# Patient Record
Sex: Male | Born: 1935
Health system: Southern US, Community
[De-identification: ages and names within clinical notes are randomized; demographics above are authoritative.]

## PROBLEM LIST (undated history)

## (undated) DIAGNOSIS — N183 Chronic kidney disease, stage 3 unspecified: Secondary | ICD-10-CM

## (undated) DIAGNOSIS — L409 Psoriasis, unspecified: Secondary | ICD-10-CM

## (undated) DIAGNOSIS — Z87442 Personal history of urinary calculi: Secondary | ICD-10-CM

## (undated) DIAGNOSIS — I2510 Atherosclerotic heart disease of native coronary artery without angina pectoris: Secondary | ICD-10-CM

## (undated) DIAGNOSIS — G5600 Carpal tunnel syndrome, unspecified upper limb: Secondary | ICD-10-CM

## (undated) DIAGNOSIS — Z8739 Personal history of other diseases of the musculoskeletal system and connective tissue: Secondary | ICD-10-CM

## (undated) DIAGNOSIS — R079 Chest pain, unspecified: Secondary | ICD-10-CM

## (undated) DIAGNOSIS — R200 Anesthesia of skin: Secondary | ICD-10-CM

## (undated) DIAGNOSIS — N400 Enlarged prostate without lower urinary tract symptoms: Secondary | ICD-10-CM

## (undated) DIAGNOSIS — I350 Nonrheumatic aortic (valve) stenosis: Secondary | ICD-10-CM

## (undated) DIAGNOSIS — I4892 Unspecified atrial flutter: Secondary | ICD-10-CM

## (undated) DIAGNOSIS — J189 Pneumonia, unspecified organism: Secondary | ICD-10-CM

## (undated) DIAGNOSIS — H269 Unspecified cataract: Secondary | ICD-10-CM

## (undated) HISTORY — PX: OTHER SURGICAL HISTORY: SHX169

## (undated) HISTORY — DX: Nonrheumatic aortic (valve) stenosis: I35.0

## (undated) HISTORY — PX: COLONOSCOPY: SHX174

## (undated) HISTORY — PX: TONSILLECTOMY: SUR1361

---

## 2000-02-10 ENCOUNTER — Emergency Department (HOSPITAL_COMMUNITY): Admission: EM | Admit: 2000-02-10 | Discharge: 2000-02-10 | Payer: Self-pay | Admitting: Emergency Medicine

## 2000-02-10 ENCOUNTER — Encounter: Payer: Self-pay | Admitting: Emergency Medicine

## 2000-11-15 ENCOUNTER — Ambulatory Visit (HOSPITAL_COMMUNITY): Admission: RE | Admit: 2000-11-15 | Discharge: 2000-11-15 | Payer: Self-pay | Admitting: Cardiology

## 2002-12-11 ENCOUNTER — Ambulatory Visit (HOSPITAL_COMMUNITY): Admission: RE | Admit: 2002-12-11 | Discharge: 2002-12-11 | Payer: Self-pay | Admitting: Gastroenterology

## 2007-06-24 ENCOUNTER — Ambulatory Visit (HOSPITAL_COMMUNITY): Admission: RE | Admit: 2007-06-24 | Discharge: 2007-06-24 | Payer: Self-pay | Admitting: Urology

## 2008-11-01 ENCOUNTER — Ambulatory Visit (HOSPITAL_COMMUNITY): Admission: RE | Admit: 2008-11-01 | Discharge: 2008-11-01 | Payer: Self-pay | Admitting: Ophthalmology

## 2008-11-05 ENCOUNTER — Ambulatory Visit (HOSPITAL_COMMUNITY): Admission: RE | Admit: 2008-11-05 | Discharge: 2008-11-05 | Payer: Self-pay | Admitting: Ophthalmology

## 2010-10-07 LAB — BASIC METABOLIC PANEL
BUN: 12 mg/dL (ref 6–23)
CO2: 31 mEq/L (ref 19–32)
Calcium: 9.6 mg/dL (ref 8.4–10.5)
Chloride: 102 mEq/L (ref 96–112)
Creatinine, Ser: 1.11 mg/dL (ref 0.4–1.5)
GFR calc Af Amer: >60 mL/min (ref >60)
GFR calc non Af Amer: >60 mL/min (ref >60)
Glucose, Bld: 94 mg/dL (ref 70–99)
Potassium: 4.6 mEq/L (ref 3.5–5.1)
Sodium: 139 mEq/L (ref 135–145)

## 2010-10-07 LAB — CBC
HCT: 39.3 % (ref 39.0–52.0)
Hemoglobin: 13.7 g/dL (ref 13.0–17.0)
MCHC: 34.9 g/dL (ref 30.0–36.0)
MCV: 86.4 fL (ref 78.0–100.0)
Platelets: 211 10*3/uL (ref 150–400)
RBC: 4.55 MIL/uL (ref 4.22–5.81)
RDW: 13.5 % (ref 11.5–15.5)
WBC: 4.8 10*3/uL (ref 4.0–10.5)

## 2010-10-07 LAB — URINALYSIS, ROUTINE W REFLEX MICROSCOPIC
Glucose, UA: NEGATIVE mg/dL
Hgb urine dipstick: NEGATIVE
Ketones, ur: NEGATIVE mg/dL
pH: 5.5 (ref 5.0–8.0)

## 2010-10-07 LAB — URINE MICROSCOPIC-ADD ON

## 2010-11-11 NOTE — Op Note (Signed)
NAME:  Steven [name], Steven [name]                ACCOUNT NO.:  [id number]   MEDICAL RECORD NO.:  [id number]          PATIENT TYPE:  AMB   LOCATION:  SDS                          FACILITY:  [facility]   PHYSICIAN:  Salley Scarlet., M.D.DATE OF BIRTH:  [birth year]   DATE OF PROCEDURE:  11/02/2008  DATE OF DISCHARGE:  11/01/2008                               OPERATIVE REPORT   PREOPERATIVE DIAGNOSIS:  Immature cataract, right eye.   POSTOPERATIVE DIAGNOSIS:  Immature cataract, right eye.   OPERATION:  Kelman phacoemulsification of cataract, right eye with  intraocular lens implantation.   ANESTHESIA:  Local using Xylocaine with 2% Marcaine and 0.75% Wydase.   SURGEON:  Nadyne Coombes, MD   JUSTIFICATION FOR PROCEDURE:  This is a 76 year old gentleman who  complained of inability to see from the right eye.  This caused  difficulty to see and to read and to drive.  He was evaluated and found  to have a visual acuity correctable at 20/100 on the right and 20/30 on  the left.  There was a dense posterior subcapsular cataract with 2+  nuclear sclerosis of the right eye with 1+ nuclear sclerosis of the left  eye.  Cataract extraction with intraocular lens implantation was  recommended.  He is admitted at this time for that purpose.   PROCEDURE IN DETAIL:  Under the influence of IV sedation, a Van Lint  akinesia and retrobulbar anesthesia was given.  The patient was prepped  and draped in the usual manner.  The lid speculum was inserted under the  upper and lower lid of the right eye and a 4-0 silk traction suture was  passed through the belly of the superior rectus muscle retraction.  A  fornix-based conjunctival flap was turned and hemostasis was achieved  using cautery.  An incision was made in the sclera at the limbus.  This  incision was dissected down into the cornea using crescent blade.  A  sideport incision was made at 1:30 o'clock position.  OcuCoat was  injected into the eye through the  sideport incision.  The anterior  chamber was then entered through the corneoscleral tunnel incision at  11:30 o'clock position.  An anterior capsulotomy was made using bent 25  gauge needle.  The nucleus was hydrodissected using Xylocaine.  The KPE  handpiece was passed into the eye and the nucleus was emulsified without  difficulty.  The residual cortical material was aspirated.  The  posterior capsule was polished using olive-tip polisher.  The wound was  widened slightly to accommodate a foldable silicone lens.  The lens was  seated into the eye behind the iris without difficulty.  The anterior  chamber was reformed and pupil was constricted using Miochol.  The lips  of the wound were hydrated and tested to make sure that there was no  leak.  After ascertaining that there was no leak, the conjunctiva was  closed over the wound using thermal cautery.  A 1 mL of Celestone and  0.5 mL of gentamicin were then injected subconjunctivally.  Maxitrol  ophthalmic ointment and prilocaine ointment were applied  along with a  patch and Fox  shield.  The patient tolerated the procedure well and was discharged to  the Postanesthesia Recovery in a satisfactory condition.  He was  instructed to rest today, to take Tylenol every 4 hours as needed for  pain, and to see me in office tomorrow for further evaluation.      Salley Scarlet., M.D.  Electronically Signed     TB/MEDQ  D:  11/02/2008  T:  11/02/2008  Job:  [id number]

## 2010-11-14 NOTE — Op Note (Signed)
[place]. [facility]  Patient:    Steven [name], Steven [name]                         MRN: [id number] Proc. Date: 11/15/00 Adm. Date:  [id number] Attending:  [place], Peter Manning CC:         Al Decant. Janey Greaser, M.D.   Operative Report  INDICATIONS FOR PROCEDURE:  The patient is a 76 year old, black male, who has a history of hypertension and hypercholesterolemia.  He has had some left arm and chest heaviness.  Stress test suggested evidence of ischemia, low exercise level.  ACCESS:  Via the right femoral artery using standard Seldinger technique.  EQUIPMENT:  A 6 French 4 cm right and left Judkins catheter, 6 French pigtail catheter, 6 French arterial sheath.  MEDICATIONS:  Local anesthesia with 1% Xylocaine.  CONTRAST:  Omnipaque 150 cc.  HEMODYNAMIC DATA:  Aortic pressure is 149/76.  Left ventricular pressure is 146 with an EDP of 19.  There was no left ventricular or aortic valve gradient.  ANGIOGRAPHIC DATA:  Left coronary artery:  The left coronary artery arises and distributes normally.  Left main:  The left main coronary artery is normal.  Left anterior descending:  The left anterior descending artery and its branches are normal.  Left circumflex:  The left circumflex coronary artery has some mild narrowing in the proximal vessel less than or equal to 10%.  No obstructive disease is noted.  Right coronary artery:  The right coronary artery arises and distributes normally and is a large dominant vessel.  It appears normal.  LEFT VENTRICULAR ANGIOGRAPHY:  The left ventricular angiography is performed in the RAO and LAO cranial views.  It demonstrates normal left ventricular size with hyperdynamic left ventricular contractility.  Ejection fraction is estimated at 75%.  There is no mitral regurgitation or prolapse.  Postprocedure closure of the right femoral site was obtained using a AngioSeal collagen plug device.  This yielded excellent  hemostasis.  FINAL INTERPRETATION: 1. No significant coronary artery disease. 2. Normal left ventricular function. DD:  11/15/00 TD:  11/15/00 Job: [id number]

## 2010-11-14 NOTE — Op Note (Signed)
   NAME:  CAMDEN, KNOTEK                   ACCOUNT NO.:  [id number]   MEDICAL RECORD NO.:  [id number]                   PATIENT TYPE:  AMB   LOCATION:  ENDO                                 FACILITY:  [facility]   PHYSICIAN:  Danise Edge, M.D.                DATE OF BIRTH:  11/20/1934   DATE OF PROCEDURE:  12/11/2002  DATE OF DISCHARGE:                                 OPERATIVE REPORT   PROCEDURE PERFORMED:  Screening colonoscopy.   REFERRING PHYSICIAN:  Al Decant. Janey Greaser, M.D.   ENDOSCOPIST:  Charolett Bumpers, M.D.   INDICATIONS FOR PROCEDURE:  Mr. Zamarion Longest is a 76 year old male born  1935-04-09.  Mr. Sigg is scheduled to undergo his first screening  colonoscopy with polypectomy to prevent colon cancer.   PREMEDICATION:  Versed 7 mg, Demerol 70 mg.   INSTRUMENT USED:  Pediatric Olympus video colonoscope.   DESCRIPTION OF PROCEDURE:  After obtaining informed consent, Mr. Menning was  placed in the left lateral decubitus position.  I administered intravenous  Demerol and intravenous Versed to achieve conscious sedation for the  procedure.  The patient's blood pressure, oxygen saturations and cardiac  rhythm were monitored throughout the procedure and documented in the medical  record.   Anal inspection was normal.  Digital rectal exam revealed a non-nodular  prostate.  The pediatric Olympus adjustable colonoscope was introduced into  the rectum and advanced to the cecum.  Colonic preparation for the exam  today was excellent.   Rectum:  Normal.   Sigmoid colon and descending colon:  Normal.   Splenic flexure:  Normal.   Transverse colon:  Normal.   Hepatic flexure:  Normal.   Ascending colon:  Normal.   Cecum and ileocecal valve:  Normal.    ASSESSMENT:  Normal screening proctocolonoscopy to the cecum.  No endoscopic  evidence for the presence of colorectal neoplasia.   PLAN:                                                  Danise Edge,  M.D.    MJ/MEDQ  D:  12/11/2002  T:  12/11/2002  Job:  [id number]

## 2010-11-14 NOTE — H&P (Signed)
[place]. [facility]  Patient:    Steven [name], Steven [name]                         MRN: [id number] Adm. Date:  11/15/00 Attending:  Peter M. [place], M.D. CC:         Steven Decant. Janey Greaser, M.D.   History and Physical  DATE OF BIRTH:  [birth year]  CHIEF COMPLAINT:  Chest pain.  HISTORY OF PRESENT ILLNESS:  Steven [name] is a 76 year old black male who generally has been in good health.  Over the past two to three months he has been experiencing pain in his left hand and arm, as well as heaviness in his chest.  He cannot really associate this with exertion but he has noticed some shortness of breath on exertion.  He has had no nausea, vomiting, or diaphoresis, no other radiation of his discomfort.  He was seen recently by Dr. Janey Greaser who found that he had new onset of hypertension.  An exercise stress test was performed.  The patient was able to exercise only for three minutes and 43 seconds.  He had a marked hypertensive blood pressure response. He had marked ECG changes with at least 2 mm of ST segment depression in the lateral leads.  Because of his symptoms and markedly abnormal stress test he is now admitted for cardiac catheterization.  PAST MEDICAL HISTORY:  Significant for hypertension.  He was hospitalized with knee problems last year and had some fluid withdrawn from his knee.  He does not know his cholesterol status.  He denies history of diabetes.  ALLERGIES:  No known allergies.  CURRENT MEDICATIONS: 1. Altace 2.5 mg daily. 2. Aspirin 325 mg daily.  SOCIAL HISTORY:  Patient works at [employer], has worked there for 40 years. He is married.  He has seven children.  Denies tobacco or alcohol use.  FAMILY HISTORY:  Father is in his 19s and in reasonably good health.  Mother died in her 22s with kidney failure.  He has one sister who has heart trouble; three other siblings are in good health.  REVIEW OF SYSTEMS:  He has no history of TIA or stroke, no  claudication symptoms, no increased edema or orthopnea.  No bowel or bladder complaints. All other review of systems are negative.  PHYSICAL EXAMINATION:  GENERAL:  Patient is a pleasant, overweight black male in no apparent distress.  VITAL SIGNS:  Weight is 252.5.  Blood pressure 132/72, pulse 72 and regular, respirations are normal at 20.  HEENT:  Pupils equal, round, and reactive to light and accommodation. Funduscopic exam is benign.  Conjunctivae are clear, oropharynx is clear.  NECK:  Supple without JVD, adenopathy, thyromegaly, or bruits.  LUNGS:  Clear.  CARDIAC:  Reveals a regular rate and rhythm without gallops, murmurs, rubs, or clicks.  ABDOMEN:  Soft and nontender.  There is no hepatosplenomegaly, masses, or bruits.  Femoral and pedal pulses are 2+ and symmetric.  He has no edema.  RECTAL:  Deferred, having recently been done by Dr. Janey Greaser.  NEUROLOGIC:  Intact.  LABORATORY DATA:  Resting ECG demonstrates normal sinus rhythm, a normal ECG.  Chemistry panel was unremarkable.  Cholesterol was 202 with an LDL of 145, HDL 42, triglycerides 98.  PSA was normal at 2.9.  IMPRESSION: 1. Chest pain consistent with angina pectoris with markedly positive early    stress test. 2. Hypertension. 3. Hypercholesterolemia. 4. Obesity.  PLAN:  Patient will  be admitted for cardiac catheterization with further therapy pending these results. DD:  11/10/00 TD:  11/10/00 Job: [id number]

## 2012-03-03 ENCOUNTER — Encounter (HOSPITAL_COMMUNITY): Payer: Self-pay

## 2012-03-03 ENCOUNTER — Emergency Department (HOSPITAL_COMMUNITY)
Admission: EM | Admit: 2012-03-03 | Discharge: 2012-03-03 | Disposition: A | Discharge status: Home / Self Care | Payer: Medicare Other | Source: Home / Self Care | Attending: Emergency Medicine | Admitting: Emergency Medicine

## 2012-03-03 DIAGNOSIS — M5412 Radiculopathy, cervical region: Secondary | ICD-10-CM

## 2012-03-03 DIAGNOSIS — M109 Gout, unspecified: Secondary | ICD-10-CM

## 2012-03-03 LAB — URIC ACID: Uric Acid, Serum: 7.4 mg/dL (ref 4.0–7.8)

## 2012-03-03 MED ORDER — TRAMADOL HCL 50 MG PO TABS: 100.0000 mg | ORAL_TABLET | Freq: Three times a day (TID) | ORAL | Status: AC | PRN

## 2012-03-03 MED ORDER — COLCHICINE 0.6 MG PO TABS: ORAL_TABLET | ORAL | Status: DC

## 2012-03-03 MED ORDER — PREDNISONE 10 MG PO TABS: ORAL_TABLET | ORAL | Status: DC

## 2012-03-03 NOTE — ED Provider Notes (Signed)
Chief Complaint  Patient presents with  . Foot Pain  . Hand Pain    History of Present Illness:   The patient is a 77 year old male who had a 3 to four-day history of pain, swelling, and redness over the MTP joint of the right great toe. He denies any trauma to the area. He does have gout and takes a pill for this. He's not sure what the name of it is. He's not tried the pills, because he didn't think was gout. He denies any fever, chills, or other joint pain with the exception of pain in the right hand for the past month. This is localized over the little finger and half of the ring finger. He denies any numbness or tingling. There's no pain in the shoulder or the neck. No muscle weakness.  Review of Systems:  Other than noted above, the patient denies any of the following symptoms: Systemic:  No fevers, chills, sweats, or aches.  No fatigue or tiredness. Musculoskeletal:  No joint pain, arthritis, bursitis, swelling, back pain, or neck pain. Neurological:  No muscular weakness, paresthesias, headache, or trouble with speech or coordination.  No dizziness.  PMFSH:  Past medical history, family history, social history, meds, and allergies were reviewed.  Physical Exam:   Vital signs:  BP 167/79  Pulse 80  Temp 99.1 F (37.3 C) (Oral)  Resp 18  SpO2 98% Gen:  Alert and oriented times 3.  In no distress. Musculoskeletal: He has swelling and pain to palpation over the MTP joint of the right great toe. There is no erythema or heat. He does have pain on movement of the toe. Eye exam of the hand there is no swelling, tenderness to palpation, or erythema. Otherwise, all joints had a full a ROM with no swelling, bruising or deformity.  No edema, pulses full. Extremities were warm and pink.  Capillary refill was brisk.  Skin:  Clear, warm and dry.  No rash. Neuro:  Alert and oriented times 3.  Muscle strength was normal.  Sensation was intact to light touch. DTRs are normal.  Other Labs Obtained at  Urgent Care Center:  A uric acid levels obtained.  Results are pending at this time and we will call about any positive results.  Assessment:  The primary encounter diagnosis was Gout. A diagnosis of Cervical radiculopathy was also pertinent to this visit.  Plan:   1.  The following meds were prescribed:   New Prescriptions   COLCHICINE 0.6 MG TABLET    Take 2 now and 1 in 1 hour.  May repeat dose once daily.  For gout attack.   PREDNISONE (DELTASONE) 10 MG TABLET    Take 4 tabs daily for 4 days, 3 tabs daily for 4 days, 2 tabs daily for 4 days, then 1 tab daily for 4 days.   TRAMADOL (ULTRAM) 50 MG TABLET    Take 2 tablets (100 mg total) by mouth every 8 (eight) hours as needed for pain.   2.  The patient was instructed in symptomatic care, including rest and activity, elevation, application of ice and compression.  Appropriate handouts were given. 3.  The patient was told to return if becoming worse in any way, if no better in 3 or 4 days, and given some red flag symptoms that would indicate earlier return.   4.  The patient was told to follow up with his primary care physician in one week.   Reuben Likes, MD 03/03/12 2206

## 2012-03-03 NOTE — ED Notes (Signed)
C/o pain in rt hand for 2-3 weeks and pain in rt great toe that shoots through plantar surface of foot. For 3-4 days.  Denies any fall or injury.

## 2012-03-04 ENCOUNTER — Telehealth (HOSPITAL_COMMUNITY): Payer: Self-pay | Admitting: *Deleted

## 2012-03-04 NOTE — ED Notes (Signed)
Uric Acid 7.4.  I called pt. and verified x 2. Pt. given result and range.Pt. states he is getting better on the medication. Pt. told to talk to his PCP about it. Vassie Moselle 03/04/2012

## 2012-03-29 ENCOUNTER — Other Ambulatory Visit: Payer: Self-pay | Admitting: Diagnostic Neuroimaging

## 2012-03-29 DIAGNOSIS — R200 Anesthesia of skin: Secondary | ICD-10-CM

## 2012-03-29 DIAGNOSIS — M79609 Pain in unspecified limb: Secondary | ICD-10-CM

## 2012-04-02 ENCOUNTER — Ambulatory Visit
Admission: RE | Admit: 2012-04-02 | Discharge: 2012-04-02 | Disposition: A | Discharge status: Home / Self Care | Payer: Medicare Other | Source: Ambulatory Visit | Attending: Diagnostic Neuroimaging | Admitting: Diagnostic Neuroimaging

## 2012-04-02 DIAGNOSIS — R200 Anesthesia of skin: Secondary | ICD-10-CM

## 2012-04-02 DIAGNOSIS — M79609 Pain in unspecified limb: Secondary | ICD-10-CM

## 2012-12-29 ENCOUNTER — Other Ambulatory Visit: Payer: Self-pay

## 2012-12-29 MED ORDER — GABAPENTIN 300 MG PO CAPS: 300.0000 mg | ORAL_CAPSULE | Freq: Three times a day (TID) | ORAL | Status: DC

## 2013-02-24 ENCOUNTER — Other Ambulatory Visit: Payer: Self-pay | Admitting: Ophthalmology

## 2013-02-24 NOTE — H&P (Signed)
Patient Record  Steven [name], Steven [name] Patient Number:  [id number] Date of Birth:  [birth year] Age:  78 years old    Gender:  Male Date of Evaluation:  February 24, 2013  Chief Complaint:   78 year old male is referred for Cataract evaluation. He reports blurred vision OU with difficulty seeing road signs, and for driving at night. He has to use a magnifier with his glasses to read.  History of Present Illness:   He is Pseudophakic OD.OD.  C/O blurred vision os.   No pain or discomfort; no pus or mucus; no burning or itching.  Presents for evaluation.( Reviewed by Doctor: GG) Past History:  Allergies:  NKDA Medications:   Other Medications:  Gabapentin, Medication for pinced nerve Birth History:  none Past Ocular History:   Cataract surgery right eye Past Medical History:  none Past Surgical History:  none Family History:  no amblyopia, no blindness, + cataracts (father), no crossed eyes, no diabetic retinopathy, + glaucoma (father), no macular degeneration, no retinal detachment, no cancer, + diabetes (mother, sister), no heart disease, + high blood pressure (sister, mother), no stroke Social History:   Smoking Status: former smoker  Alcohol:  none   Driving status:  driving Marital status:  married Review of Systems:   All other systems are negative.  Examination:  Visual Acuity:   Distance VA Goldsby:  OD: 20/70    OS: 20/60 IOP:  OD:  14     OS:  14    @ 10:05AM (Goldmann applanation) Manifest Refraction:    Sphere    Cyl Axis       VA         Add       VA Prism Base R:  +0.50  -2.50  120    20/20       +2.75                      L:  +1.75  -2.25   60    20/50       +2.75                        Confrontation visual field:  OU:  Normal  Motility:  OU:  Normal  Pupils:  OU:  Shape, size, direct and consensual reaction normal  Adnexa:  Preauricular LN, lacrimal drainage, lacrimal glands, orbit normal  Eyelids:  Eyelids:  normal Conjunctiva:  OU:  bulbar, palpebral normal  Cornea:   OU:  epithelium, stroma, endothelium, tear film normal  Anterior Chamber:  OU:  depth normal, no cell, no flare  Iris:  OU:  normal Dilation:  OS: AK-Pentolate, Tropicacyl @ 11:38AM  Lens:  OD:  PC IOL in good position; ; 1+ posterior capsular opacity; 1+ cortical, 1+ posterior subcapsular cataract OS:  1+ cortical, 2+ posterior subcapsular cataract; 2+ nuclear sclerosis cataract  Vitreous:  OU:  normal  Optic Disc:  OD:  cupping: 0.35 x 0.4  OS:  cupping: 0.35 x 0.4   Macula:  OU:  normal  Vessels:  OU:  normal  Periphery:  OU:  normal A Scan / IOLMaster:  AScan predicts +19.00 Acrysof MA50BM for emmetropia OD  Orientation to person, place and time:  Normal  Mood and affect:  Normal  Impression:  366.19  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 We discussed  the related symptoms , visual significance and when we intervene with surgery. We discussed the surgical techniques used, risks and benefits of surgery.  I have recommended proceeding with Phaco IOL OS.   He indicated understanding our discussion and felt that his questions had been answered to his satisfaction.  He indicates that he desires to proceed with the recommended treatment/care plan. Patient Instructions: Please do not eat anything after mignight the day before surgery. Return to clinic:  september 18th atv 4:45 PM for post-operative follow-up  Schedule:  Phacoemulsification, Posterior Chamber Intraocular Lens , OS x 03/15/2013   (electronically signed) Shade Flood, MD

## 2013-03-09 ENCOUNTER — Encounter (HOSPITAL_COMMUNITY): Payer: Self-pay | Admitting: Respiratory Therapy

## 2013-03-09 ENCOUNTER — Other Ambulatory Visit (HOSPITAL_COMMUNITY): Payer: Self-pay | Admitting: *Deleted

## 2013-03-09 NOTE — Pre-Procedure Instructions (Signed)
Kasheem Toner Lux  03/09/2013   Your procedure is scheduled on:  March 15, 2013 at 2:00 PM  Report to [facility].  Call this number if you have problems the morning of surgery: [phone]   Remember:   Do not eat food or drink liquids after midnight.   Take these medicines the morning of surgery with A SIP OF WATER: Neurontin (Gabapentin)   Stop all Vitamins, Herbal Medications, Aspirin and Non-Steroidals (Voltaren (Diclofenac), Ibuprofen, Aleve, etc) as of today, 03/10/13.   Do not wear jewelry, make-up or nail polish.  Do not wear lotions, powders, or perfumes. You may wear deodorant.  Do not shave 48 hours prior to surgery. Men may shave face and neck.  Do not bring valuables to the hospital.  [facility] is not responsible                   for any belongings or valuables.  Contacts, dentures or bridgework may not be worn into surgery.  Leave suitcase in the car. After surgery it may be brought to your room.  For patients admitted to the hospital, checkout time is 11:00 AM the day of  discharge.   Patients discharged the day of surgery will not be allowed to drive  home.  Name and phone number of your driver: Family/friend  Special Instructions: Shower using CHG 2 nights before surgery and the night before surgery.  If you shower the day of surgery use CHG.  Use special wash - you have one bottle of CHG for all showers.  You should use approximately 1/3 of the bottle for each shower.   Please read over the following fact sheets that you were given: Pain Booklet, Coughing and Deep Breathing and Surgical Site Infection Prevention

## 2013-03-10 ENCOUNTER — Encounter (HOSPITAL_COMMUNITY)
Admission: RE | Admit: 2013-03-10 | Discharge: 2013-03-10 | Disposition: A | Discharge status: Home / Self Care | Payer: Medicare Other | Source: Ambulatory Visit | Attending: Ophthalmology | Admitting: Ophthalmology

## 2013-03-10 ENCOUNTER — Encounter (HOSPITAL_COMMUNITY): Payer: Self-pay

## 2013-03-10 DIAGNOSIS — Z01812 Encounter for preprocedural laboratory examination: Secondary | ICD-10-CM | POA: Insufficient documentation

## 2013-03-10 DIAGNOSIS — Z01818 Encounter for other preprocedural examination: Secondary | ICD-10-CM | POA: Insufficient documentation

## 2013-03-10 HISTORY — DX: Personal history of urinary calculi: Z87.442

## 2013-03-10 HISTORY — DX: Unspecified cataract: H26.9

## 2013-03-10 HISTORY — DX: Psoriasis, unspecified: L40.9

## 2013-03-10 HISTORY — DX: Benign prostatic hyperplasia without lower urinary tract symptoms: N40.0

## 2013-03-10 HISTORY — DX: Carpal tunnel syndrome, unspecified upper limb: G56.00

## 2013-03-10 HISTORY — DX: Anesthesia of skin: R20.0

## 2013-03-10 HISTORY — DX: Personal history of other diseases of the musculoskeletal system and connective tissue: Z87.39

## 2013-03-10 LAB — CBC
HCT: 36.6 % — ABNORMAL LOW (ref 39.0–52.0)
Platelets: 172 10*3/uL (ref 150–400)
RDW: 13.9 % (ref 11.5–15.5)
WBC: 3.6 10*3/uL — ABNORMAL LOW (ref 4.0–10.5)

## 2013-03-10 NOTE — Progress Notes (Addendum)
Pt doesn't have a cardiologist  Stress test/echo done 41yrs ago  Heart cath done 69yrs ago    Medical Md is Dr.Kevin Little  Denies ekg or cxr in past yr

## 2013-03-10 NOTE — Pre-Procedure Instructions (Signed)
Rupert Azzara Tourigny  03/10/2013   Your procedure is scheduled on:  Wed, Sept 17 @ 2:00 PM  Report to [facility] at 12:00 PM.  Call this number if you have problems the morning of surgery: [phone]   Remember:   Do not eat food or drink liquids after midnight.   Take these medicines the morning of surgery with A SIP OF WATER: Gabapentin(Neurontin-if needed)   Do not wear jewelry  Do not wear lotions, powders, or colognes. You may wear deodorant.  Do not shave 48 hours prior to surgery. Men may shave face and neck.  Do not bring valuables to the hospital.  [facility] is not responsible                   for any belongings or valuables.  Contacts, dentures or bridgework may not be worn into surgery.  Leave suitcase in the car. After surgery it may be brought to your room.  For patients admitted to the hospital, checkout time is 11:00 AM the day of  discharge.   Patients discharged the day of surgery will not be allowed to drive  home.    Special Instructions: Shower using CHG 2 nights before surgery and the night before surgery.  If you shower the day of surgery use CHG.  Use special wash - you have one bottle of CHG for all showers.  You should use approximately 1/3 of the bottle for each shower.   Please read over the following fact sheets that you were given: Pain Booklet, Coughing and Deep Breathing and Surgical Site Infection Prevention

## 2013-03-14 NOTE — Progress Notes (Signed)
I spoke with patient and informed him of new arrival time of 0630.

## 2013-03-15 ENCOUNTER — Encounter (HOSPITAL_COMMUNITY)
Admission: RE | Disposition: A | Discharge status: Home / Self Care | Payer: Self-pay | Source: Ambulatory Visit | Attending: Ophthalmology

## 2013-03-15 ENCOUNTER — Encounter (HOSPITAL_COMMUNITY): Payer: Self-pay | Admitting: Anesthesiology

## 2013-03-15 ENCOUNTER — Ambulatory Visit (HOSPITAL_COMMUNITY)
Admission: RE | Admit: 2013-03-15 | Discharge: 2013-03-15 | Disposition: A | Discharge status: Home / Self Care | Payer: Medicare Other | Source: Ambulatory Visit | Attending: Ophthalmology | Admitting: Ophthalmology

## 2013-03-15 ENCOUNTER — Encounter (HOSPITAL_COMMUNITY): Payer: Self-pay | Admitting: *Deleted

## 2013-03-15 ENCOUNTER — Ambulatory Visit (HOSPITAL_COMMUNITY): Payer: Medicare Other | Admitting: Anesthesiology

## 2013-03-15 DIAGNOSIS — H47239 Glaucomatous optic atrophy, unspecified eye: Secondary | ICD-10-CM | POA: Insufficient documentation

## 2013-03-15 DIAGNOSIS — H2589 Other age-related cataract: Secondary | ICD-10-CM | POA: Insufficient documentation

## 2013-03-15 HISTORY — PX: CATARACT EXTRACTION W/PHACO: SHX586

## 2013-03-15 SURGERY — PHACOEMULSIFICATION, CATARACT, WITH IOL INSERTION
Anesthesia: Monitor Anesthesia Care | Site: Eye | Laterality: Left | Wound class: Clean

## 2013-03-15 MED ORDER — SODIUM CHLORIDE 0.9 % IV SOLN
INTRAVENOUS | Status: DC | PRN
  Administered 2013-03-15: 08:00:00 via INTRAVENOUS

## 2013-03-15 MED ORDER — ACETAZOLAMIDE SODIUM 500 MG IJ SOLR
INTRAMUSCULAR | Status: AC
  Filled 2013-03-15: qty 500

## 2013-03-15 MED ORDER — EPINEPHRINE HCL 1 MG/ML IJ SOLN
INTRAOCULAR | Status: DC | PRN
  Administered 2013-03-15: 09:00:00

## 2013-03-15 MED ORDER — TRIAMCINOLONE ACETONIDE 40 MG/ML IJ SUSP
INTRAMUSCULAR | Status: AC
  Filled 2013-03-15: qty 5

## 2013-03-15 MED ORDER — BSS IO SOLN
INTRAOCULAR | Status: AC
  Filled 2013-03-15: qty 500

## 2013-03-15 MED ORDER — LIDOCAINE HCL 2 % IJ SOLN
INTRAMUSCULAR | Status: AC
  Filled 2013-03-15: qty 20

## 2013-03-15 MED ORDER — SODIUM HYALURONATE 10 MG/ML IO SOLN
INTRAOCULAR | Status: AC
  Filled 2013-03-15: qty 0.85

## 2013-03-15 MED ORDER — NA CHONDROIT SULF-NA HYALURON 40-30 MG/ML IO SOLN
INTRAOCULAR | Status: AC
  Filled 2013-03-15: qty 0.5

## 2013-03-15 MED ORDER — PROVISC 10 MG/ML IO SOLN
INTRAOCULAR | Status: DC | PRN
  Administered 2013-03-15: 8.5 mg via INTRAOCULAR

## 2013-03-15 MED ORDER — PHENYLEPHRINE HCL 2.5 % OP SOLN
1.0000 [drp] | OPHTHALMIC | Status: AC | PRN
  Administered 2013-03-15 (×3): 1 [drp] via OPHTHALMIC
  Filled 2013-03-15: qty 2

## 2013-03-15 MED ORDER — DEXAMETHASONE SODIUM PHOSPHATE 10 MG/ML IJ SOLN
INTRAMUSCULAR | Status: AC
  Filled 2013-03-15: qty 1

## 2013-03-15 MED ORDER — DEXAMETHASONE SODIUM PHOSPHATE 10 MG/ML IJ SOLN
INTRAMUSCULAR | Status: DC | PRN
  Administered 2013-03-15: 10 mg

## 2013-03-15 MED ORDER — EPINEPHRINE HCL 1 MG/ML IJ SOLN
INTRAMUSCULAR | Status: AC
  Filled 2013-03-15: qty 1

## 2013-03-15 MED ORDER — BUPIVACAINE HCL (PF) 0.75 % IJ SOLN
INTRAMUSCULAR | Status: AC
  Filled 2013-03-15: qty 10

## 2013-03-15 MED ORDER — CEFAZOLIN SUBCONJUNCTIVAL INJECTION 100 MG/0.5 ML
100.0000 mg | INJECTION | SUBCONJUNCTIVAL | Status: DC
  Filled 2013-03-15: qty 0.5

## 2013-03-15 MED ORDER — NA CHONDROIT SULF-NA HYALURON 40-30 MG/ML IO SOLN
INTRAOCULAR | Status: DC | PRN
  Administered 2013-03-15: 0.5 mL via INTRAOCULAR

## 2013-03-15 MED ORDER — BALANCED SALT IO SOLN
INTRAOCULAR | Status: DC | PRN
  Administered 2013-03-15: 15 mL via INTRAOCULAR

## 2013-03-15 MED ORDER — TETRACAINE HCL 0.5 % OP SOLN
2.0000 [drp] | OPHTHALMIC | Status: AC
  Administered 2013-03-15: 2 [drp] via OPHTHALMIC
  Filled 2013-03-15: qty 2

## 2013-03-15 MED ORDER — PROPOFOL 10 MG/ML IV BOLUS
INTRAVENOUS | Status: DC | PRN
  Administered 2013-03-15: 60 mg via INTRAVENOUS

## 2013-03-15 MED ORDER — LIDOCAINE HCL 2 % IJ SOLN
INTRAMUSCULAR | Status: DC | PRN
  Administered 2013-03-15 (×2): via RETROBULBAR

## 2013-03-15 MED ORDER — HYPROMELLOSE (GONIOSCOPIC) 2.5 % OP SOLN
OPHTHALMIC | Status: DC | PRN
  Administered 2013-03-15: 2 [drp] via OPHTHALMIC

## 2013-03-15 MED ORDER — GATIFLOXACIN 0.5 % OP SOLN
1.0000 [drp] | OPHTHALMIC | Status: AC | PRN
  Administered 2013-03-15 (×3): 1 [drp] via OPHTHALMIC
  Filled 2013-03-15: qty 2.5

## 2013-03-15 MED ORDER — HYPROMELLOSE (GONIOSCOPIC) 2.5 % OP SOLN
OPHTHALMIC | Status: AC
  Filled 2013-03-15: qty 15

## 2013-03-15 MED ORDER — PREDNISOLONE ACETATE 1 % OP SUSP
1.0000 [drp] | OPHTHALMIC | Status: AC
  Administered 2013-03-15: 1 [drp] via OPHTHALMIC
  Filled 2013-03-15: qty 5

## 2013-03-15 MED ORDER — BACITRACIN-POLYMYXIN B 500-10000 UNIT/GM OP OINT
TOPICAL_OINTMENT | OPHTHALMIC | Status: AC
  Filled 2013-03-15: qty 3.5

## 2013-03-15 MED ORDER — BACITRACIN-POLYMYXIN B 500-10000 UNIT/GM OP OINT
TOPICAL_OINTMENT | OPHTHALMIC | Status: DC | PRN
  Administered 2013-03-15: 1 via OPHTHALMIC

## 2013-03-15 MED ORDER — CEFAZOLIN SUBCONJUNCTIVAL INJECTION 100 MG/0.5 ML
INJECTION | SUBCONJUNCTIVAL | Status: DC | PRN
  Administered 2013-03-15: 100 mg via SUBCONJUNCTIVAL

## 2013-03-15 SURGICAL SUPPLY — 59 items
APPLICATOR COTTON TIP 6IN STRL (MISCELLANEOUS) ×2 IMPLANT
APPLICATOR DR MATTHEWS STRL (MISCELLANEOUS) ×2 IMPLANT
BAG MINI COLL DRAIN (WOUND CARE) ×2 IMPLANT
BLADE EYE MINI 60D BEAVER (BLADE) IMPLANT
BLADE KERATOME 2.75 (BLADE) ×2 IMPLANT
BLADE STAB KNIFE 15DEG (BLADE) IMPLANT
CANNULA ANTERIOR CHAMBER 27GA (MISCELLANEOUS) IMPLANT
CLOTH BEACON ORANGE TIMEOUT ST (SAFETY) ×2 IMPLANT
COVER MAYO STAND STRL (DRAPES) ×2 IMPLANT
DRAPE OPHTHALMIC 77X100 STRL (CUSTOM PROCEDURE TRAY) ×2 IMPLANT
DRAPE POUCH INSTRU U-SHP 10X18 (DRAPES) ×2 IMPLANT
DRSG TEGADERM 4X4.75 (GAUZE/BANDAGES/DRESSINGS) ×2 IMPLANT
FILTER BLUE MILLIPORE (MISCELLANEOUS) IMPLANT
GLOVE BIO SURGEON STRL SZ7.5 (GLOVE) ×2 IMPLANT
GLOVE SS BIOGEL STRL SZ 6.5 (GLOVE) ×1 IMPLANT
GLOVE SUPERSENSE BIOGEL SZ 6.5 (GLOVE) ×1
GLOVE SURG SS PI 7.0 STRL IVOR (GLOVE) ×2 IMPLANT
GOWN SRG XL XLNG 56XLVL 4 (GOWN DISPOSABLE) ×1 IMPLANT
GOWN STRL NON-REIN LRG LVL3 (GOWN DISPOSABLE) ×2 IMPLANT
GOWN STRL NON-REIN XL XLG LVL4 (GOWN DISPOSABLE) ×1
KIT BASIN OR (CUSTOM PROCEDURE TRAY) ×2 IMPLANT
KIT ROOM TURNOVER OR (KITS) ×2 IMPLANT
KNIFE GRIESHABER SHARP 2.5MM (MISCELLANEOUS) ×2 IMPLANT
LENS IOL ACRYSOF MP POST 19.0 (Intraocular Lens) ×2 IMPLANT
MASK EYE SHIELD (GAUZE/BANDAGES/DRESSINGS) ×2 IMPLANT
NEEDLE 18GX1X1/2 (RX/OR ONLY) (NEEDLE) ×2 IMPLANT
NEEDLE 22X1 1/2 (OR ONLY) (NEEDLE) IMPLANT
NEEDLE 25GX 5/8IN NON SAFETY (NEEDLE) ×2 IMPLANT
NEEDLE FILTER BLUNT 18X 1/2SAF (NEEDLE)
NEEDLE FILTER BLUNT 18X1 1/2 (NEEDLE) IMPLANT
NEEDLE HYPO 30X.5 LL (NEEDLE) ×4 IMPLANT
NS IRRIG 1000ML POUR BTL (IV SOLUTION) ×2 IMPLANT
PACK CATARACT CUSTOM (CUSTOM PROCEDURE TRAY) ×2 IMPLANT
PAD ARMBOARD 7.5X6 YLW CONV (MISCELLANEOUS) ×4 IMPLANT
PAD EYE OVAL STERILE LF (GAUZE/BANDAGES/DRESSINGS) ×2 IMPLANT
PAK PIK CVS CATARACT (OPHTHALMIC) ×2 IMPLANT
PROBE ANTERIOR 20G W/INFUS NDL (MISCELLANEOUS) IMPLANT
ROLLS DENTAL (MISCELLANEOUS) IMPLANT
SHUTTLE MONARCH TYPE A (NEEDLE) ×2 IMPLANT
SPEAR EYE SURG WECK-CEL (MISCELLANEOUS) IMPLANT
SUT ETHILON 10-0 CS-B-6CS-B-6 (SUTURE)
SUT ETHILON 5 0 P 3 18 (SUTURE)
SUT ETHILON 9 0 TG140 8 (SUTURE) IMPLANT
SUT NYLON ETHILON 5-0 P-3 1X18 (SUTURE) IMPLANT
SUT PLAIN 6 0 TG1408 (SUTURE) IMPLANT
SUT POLY NON ABSORB 10-0 8 STR (SUTURE) IMPLANT
SUT VICRYL 6 0 S 29 12 (SUTURE) IMPLANT
SUTURE EHLN 10-0 CS-B-6CS-B-6 (SUTURE) IMPLANT
SYR 20CC LL (SYRINGE) IMPLANT
SYR 5ML LL (SYRINGE) IMPLANT
SYR TB 1ML LUER SLIP (SYRINGE) IMPLANT
SYRINGE 10CC LL (SYRINGE) IMPLANT
TAPE SURG TRANSPORE 1 IN (GAUZE/BANDAGES/DRESSINGS) ×1 IMPLANT
TAPE SURGICAL TRANSPORE 1 IN (GAUZE/BANDAGES/DRESSINGS) ×1
TIP ABS 45DEG FLARED 0.9MM (TIP) ×2 IMPLANT
TOWEL OR 17X24 6PK STRL BLUE (TOWEL DISPOSABLE) ×4 IMPLANT
WATER STERILE IRR 1000ML POUR (IV SOLUTION) ×2 IMPLANT
WIPE INSTRUMENT ADHESIVE BACK (MISCELLANEOUS) ×2 IMPLANT
WIPE INSTRUMENT VISIWIPE 73X73 (MISCELLANEOUS) ×2 IMPLANT

## 2013-03-15 NOTE — Anesthesia Preprocedure Evaluation (Signed)
Anesthesia Evaluation  Patient identified by MRN, date of birth, ID band Patient awake    Reviewed: Allergy & Precautions, H&P , NPO status   Airway Mallampati: I      Dental   Pulmonary neg pulmonary ROS,  breath sounds clear to auscultation        Cardiovascular negative cardio ROS  Rhythm:Regular Rate:Normal     Neuro/Psych  Neuromuscular disease    GI/Hepatic negative GI ROS,   Endo/Other  negative endocrine ROS  Renal/GU negative Renal ROS     Musculoskeletal negative musculoskeletal ROS (+)   Abdominal   Peds  Hematology negative hematology ROS (+)   Anesthesia Other Findings   Reproductive/Obstetrics                           Anesthesia Physical Anesthesia Plan  ASA: I  Anesthesia Plan: MAC   Post-op Pain Management:    Induction: Intravenous  Airway Management Planned: Simple Face Mask  Additional Equipment:   Intra-op Plan:   Post-operative Plan:   Informed Consent:   Dental advisory given  Plan Discussed with: CRNA and Surgeon  Anesthesia Plan Comments:         Anesthesia Quick Evaluation

## 2013-03-15 NOTE — Preoperative (Signed)
Beta Blockers   Reason not to administer Beta Blockers:Not Applicable 

## 2013-03-15 NOTE — Anesthesia Postprocedure Evaluation (Signed)
  Anesthesia Post-op Note  Patient: Steven [name]  Procedure(s) Performed: Procedure(s): CATARACT EXTRACTION PHACO AND INTRAOCULAR LENS PLACEMENT (IOC) (Left)  Patient Location: PACU and Short Stay  Anesthesia Type:MAC  Level of Consciousness: awake, alert , oriented and patient cooperative  Airway and Oxygen Therapy: Patient Spontanous Breathing  Post-op Pain: none  Post-op Assessment: Post-op Vital signs reviewed, Patient's Cardiovascular Status Stable, Respiratory Function Stable, Patent Airway, No signs of Nausea or vomiting and Pain level controlled  Post-op Vital Signs: Reviewed and stable  Complications: No apparent anesthesia complications

## 2013-03-15 NOTE — H&P (View-Only) (Signed)
Patient Record  [name], [name] Patient Number:  [id number] Date of Birth:  October 22, 1934 Age:  78 years old    Gender:  Male Date of Evaluation:  February 24, 2013  Chief Complaint:   78 year old male is referred for Cataract evaluation. He reports blurred vision OU with difficulty seeing road signs, and for driving at night. He has to use a magnifier with his glasses to read.  History of Present Illness:   He is Pseudophakic OD.OD.  C/O blurred vision os.   No pain or discomfort; no pus or mucus; no burning or itching.  Presents for evaluation.( Reviewed by Doctor: GG) Past History:  Allergies:  NKDA Medications:   Other Medications:  Gabapentin, Medication for pinced nerve Birth History:  none Past Ocular History:   Cataract surgery right eye Past Medical History:  none Past Surgical History:  none Family History:  no amblyopia, no blindness, + cataracts (father), no crossed eyes, no diabetic retinopathy, + glaucoma (father), no macular degeneration, no retinal detachment, no cancer, + diabetes (mother, sister), no heart disease, + high blood pressure (sister, mother), no stroke Social History:   Smoking Status: former smoker  Alcohol:  none   Driving status:  driving Marital status:  married Review of Systems:   All other systems are negative.  Examination:  Visual Acuity:   Distance VA [place]:  OD: 20/70    OS: 20/60 IOP:  OD:  14     OS:  14    @ 10:05AM (Goldmann applanation) Manifest Refraction:    Sphere    Cyl Axis       VA         Add       VA Prism Base R:  +0.50  -2.50  120    20/20       +2.75                      L:  +1.75  -2.25   60    20/50       +2.75                        Confrontation visual field:  OU:  Normal  Motility:  OU:  Normal  Pupils:  OU:  Shape, size, direct and consensual reaction normal  Adnexa:  Preauricular LN, lacrimal drainage, lacrimal glands, orbit normal  Eyelids:  Eyelids:  normal Conjunctiva:  OU:  bulbar, palpebral normal  Cornea:   OU:  epithelium, stroma, endothelium, tear film normal  Anterior Chamber:  OU:  depth normal, no cell, no flare  Iris:  OU:  normal Dilation:  OS: AK-Pentolate, Tropicacyl @ 11:38AM  Lens:  OD:  PC IOL in good position; ; 1+ posterior capsular opacity; 1+ cortical, 1+ posterior subcapsular cataract OS:  1+ cortical, 2+ posterior subcapsular cataract; 2+ nuclear sclerosis cataract  Vitreous:  OU:  normal  Optic Disc:  OD:  cupping: 0.35 x 0.4  OS:  cupping: 0.35 x 0.4   Macula:  OU:  normal  Vessels:  OU:  normal  Periphery:  OU:  normal A Scan / IOLMaster:  AScan predicts +19.00 Acrysof MA50BM for emmetropia OD  Orientation to person, place and time:  Normal  Mood and affect:  Normal  Impression:  366.19  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 We discussed   the related symptoms , visual significance and when we intervene with surgery. We discussed the surgical techniques used, risks and benefits of surgery.  I have recommended proceeding with Phaco IOL OS.   He indicated understanding our discussion and felt that his questions had been answered to his satisfaction.  He indicates that he desires to proceed with the recommended treatment/care plan. Patient Instructions: Please do not eat anything after mignight the day before surgery. Return to clinic:  september 18th atv 4:45 PM for post-operative follow-up  Schedule:  Phacoemulsification, Posterior Chamber Intraocular Lens , OS x 03/15/2013   (electronically signed) Eira Alpert, MD   

## 2013-03-15 NOTE — Transfer of Care (Signed)
Immediate Anesthesia Transfer of Care Note  Patient: Steven [name]  Procedure(s) Performed: Procedure(s): CATARACT EXTRACTION PHACO AND INTRAOCULAR LENS PLACEMENT (IOC) (Left)  Patient Location: PACU  Anesthesia Type:MAC  Level of Consciousness: awake, alert  and oriented  Airway & Oxygen Therapy: Patient Spontanous Breathing  Post-op Assessment: Report given to PACU RN  Post vital signs: Reviewed and stable  Complications: No apparent anesthesia complications

## 2013-03-15 NOTE — Interval H&P Note (Signed)
History and Physical Interval Note:  03/15/2013 8:30 AM  Steven [name]  has presented today for surgery, with the diagnosis of Combined Cataract Left Eye  The various methods of treatment have been discussed with the patient and family. After consideration of risks, benefits and other options for treatment, the patient has consented to  Procedure(s): CATARACT EXTRACTION PHACO AND INTRAOCULAR LENS PLACEMENT (IOC) (Left) as a surgical intervention .  The patient's history has been reviewed, patient examined, no change in status, stable for surgery.  I have reviewed the patient's chart and labs.  Questions were answered to the patient's satisfaction.     Jaria Conway, Waynette Buttery

## 2013-03-15 NOTE — Op Note (Signed)
Steven [name] 03/15/2013 Cataract: Combined, Nuclear  Procedure: Phacoemulsification, Posterior Chamber Intra-ocular Lens Operative Eye:  left eye  Surgeon: Shade Flood Estimated Blood Loss: minimal Specimens for Pathology:  None Complications: none  The patient was prepared and draped in the usual manner for ocular surgery on the left eye. A Cook lid speculum was placed. A peripheral clear corneal incision was made at the surgical limbus centered at the 11:00 meridian. A separate clear corneal stab incision was made with a 15 degree blade at the 2:00 meridian to permit bi-manual technique. Viscoat and  Provisc as an underlying layer next to the capsule was instilled into the anterior chamber through that incision.  A keratome was used to create a self sealing incision entering the anterior chamber at the 11:00 meridian. A capsulorhexis was performed using a bent 25g needle. The lens was hydrodissected and the nucleus was hydrodilineated using a Nichammin cannula. The Chang chopper was inserted and used to rotate the lens to insure adequate lens mobility. The phacoemulsification handpiece was inserted and a combined phaco-chop technique was employed, fracturing the lens into separate sections with subsequent removal with the phaco handpiece.   The I/A cannula was used to remove remaining lens cortex. Provisc was instilled and used to deepen the anterior chamber and posterior capsule bag. The Monarch injector was used to place a folded Acrysof MA50BM PC IOL, + 19.00  diopters, into the capsule bag. A Sinskey lens hook was used to dial in the trailing haptic.  The I/A cannula was used to remove the viscoelastic from the anterior chamber. BSS was used to bring IOP to the desired range and the wound was checked to insure it was watertight. Subconjunctival injections of Ancef 100/0.89ml and Dexamethasone 0.5 ml of a 10mg /23ml solution were placed without complication. The lid speculum and drapes were  removed and the patient's eye was patched with Polymixin/Bacitracin ophthalmic ointment. An eye shield was placed and the patient was transferred alert and conversant from the operating room to the post-operative recovery area.   Shade Flood, MD

## 2013-03-16 ENCOUNTER — Encounter (HOSPITAL_COMMUNITY): Payer: Self-pay | Admitting: Ophthalmology

## 2013-06-23 ENCOUNTER — Other Ambulatory Visit: Payer: Self-pay | Admitting: Diagnostic Neuroimaging

## 2013-11-24 ENCOUNTER — Emergency Department (HOSPITAL_COMMUNITY): Payer: Medicare HMO

## 2013-11-24 ENCOUNTER — Inpatient Hospital Stay (HOSPITAL_COMMUNITY)
Admission: EM | Admit: 2013-11-24 | Discharge: 2013-11-27 | DRG: 195 | Disposition: A | Discharge status: Home / Self Care | Payer: Medicare HMO | Attending: Internal Medicine | Admitting: Internal Medicine

## 2013-11-24 ENCOUNTER — Encounter (HOSPITAL_COMMUNITY): Payer: Self-pay | Admitting: Emergency Medicine

## 2013-11-24 DIAGNOSIS — M7021 Olecranon bursitis, right elbow: Secondary | ICD-10-CM

## 2013-11-24 DIAGNOSIS — M7020 Olecranon bursitis, unspecified elbow: Secondary | ICD-10-CM | POA: Diagnosis present

## 2013-11-24 DIAGNOSIS — Z87891 Personal history of nicotine dependence: Secondary | ICD-10-CM

## 2013-11-24 DIAGNOSIS — J189 Pneumonia, unspecified organism: Principal | ICD-10-CM | POA: Diagnosis present

## 2013-11-24 DIAGNOSIS — R011 Cardiac murmur, unspecified: Secondary | ICD-10-CM

## 2013-11-24 DIAGNOSIS — M109 Gout, unspecified: Secondary | ICD-10-CM | POA: Diagnosis present

## 2013-11-24 DIAGNOSIS — I059 Rheumatic mitral valve disease, unspecified: Secondary | ICD-10-CM | POA: Diagnosis present

## 2013-11-24 DIAGNOSIS — Z9849 Cataract extraction status, unspecified eye: Secondary | ICD-10-CM

## 2013-11-24 DIAGNOSIS — R509 Fever, unspecified: Secondary | ICD-10-CM

## 2013-11-24 DIAGNOSIS — Z87442 Personal history of urinary calculi: Secondary | ICD-10-CM

## 2013-11-24 LAB — CBC
HCT: 33.6 % — ABNORMAL LOW (ref 39.0–52.0)
Hemoglobin: 11.4 g/dL — ABNORMAL LOW (ref 13.0–17.0)
MCH: 29 pg (ref 26.0–34.0)
MCHC: 33.9 g/dL (ref 30.0–36.0)
MCV: 85.5 fL (ref 78.0–100.0)
PLATELETS: 178 10*3/uL (ref 150–400)
RBC: 3.93 MIL/uL — AB (ref 4.22–5.81)
RDW: 13.5 % (ref 11.5–15.5)
WBC: 8.6 10*3/uL (ref 4.0–10.5)

## 2013-11-24 LAB — COMPREHENSIVE METABOLIC PANEL
ALT: 13 U/L (ref 0–53)
AST: 20 U/L (ref 0–37)
Albumin: 3.6 g/dL (ref 3.5–5.2)
Alkaline Phosphatase: 54 U/L (ref 39–117)
BILIRUBIN TOTAL: 0.3 mg/dL (ref 0.3–1.2)
BUN: 25 mg/dL — ABNORMAL HIGH (ref 6–23)
CHLORIDE: 97 meq/L (ref 96–112)
CO2: 24 meq/L (ref 19–32)
Calcium: 9.1 mg/dL (ref 8.4–10.5)
Creatinine, Ser: 1.12 mg/dL (ref 0.50–1.35)
GFR calc Af Amer: 71 mL/min — ABNORMAL LOW (ref >90)
GFR, EST NON AFRICAN AMERICAN: 61 mL/min — AB (ref >90)
Glucose, Bld: 112 mg/dL — ABNORMAL HIGH (ref 70–99)
POTASSIUM: 4.2 meq/L (ref 3.7–5.3)
SODIUM: 135 meq/L — AB (ref 137–147)
Total Protein: 7.6 g/dL (ref 6.0–8.3)

## 2013-11-24 LAB — URINALYSIS, ROUTINE W REFLEX MICROSCOPIC
Bilirubin Urine: NEGATIVE
GLUCOSE, UA: NEGATIVE mg/dL
HGB URINE DIPSTICK: NEGATIVE
KETONES UR: NEGATIVE mg/dL
Leukocytes, UA: NEGATIVE
Nitrite: NEGATIVE
PH: 7 (ref 5.0–8.0)
PROTEIN: NEGATIVE mg/dL
Specific Gravity, Urine: 1.015 (ref 1.005–1.030)
Urobilinogen, UA: 1 mg/dL (ref 0.0–1.0)

## 2013-11-24 LAB — I-STAT CG4 LACTIC ACID, ED: Lactic Acid, Venous: 0.8 mmol/L (ref 0.5–2.2)

## 2013-11-24 MED ORDER — VANCOMYCIN HCL IN DEXTROSE 1-5 GM/200ML-% IV SOLN: 1000.0000 mg | Freq: Once | INTRAVENOUS | Status: DC

## 2013-11-24 MED ORDER — SODIUM CHLORIDE 0.9 % IV SOLN
Freq: Once | INTRAVENOUS | Status: AC
  Administered 2013-11-24: via INTRAVENOUS

## 2013-11-24 MED ORDER — ACETAMINOPHEN 500 MG PO TABS
1000.0000 mg | ORAL_TABLET | Freq: Once | ORAL | Status: AC
  Administered 2013-11-24: 1000 mg via ORAL
  Filled 2013-11-24: qty 2

## 2013-11-24 MED ORDER — DEXTROSE 5 % IV SOLN: 500.0000 mg | Freq: Once | INTRAVENOUS | Status: DC

## 2013-11-24 MED ORDER — DEXTROSE 5 % IV SOLN
1.0000 g | Freq: Once | INTRAVENOUS | Status: AC
  Administered 2013-11-24: 1 g via INTRAVENOUS
  Filled 2013-11-24: qty 10

## 2013-11-24 NOTE — H&P (Signed)
[facility] History and Physical  Steven [name] POE:[id number] DOB: 08-May-1935 DOA: 11/24/2013  Referring physician: Murlean Caller, MD PCP: Gennette Pac, MD   Chief Complaint: Fever  HPI: Steven [name] is a 79 y.o. male presents with fever and weakness. Patient states that he was at his baseline until about a day ago. He states that he noted that he was not able to open his door. Patient states that he has had a fever also up to 104F. Patient has no cough no congestion noted. He has had swelling of his right elbow noted for a few days. He does not recall any trauma to the area. Patient states that he has no abdominal complaints. He has had no diarrhea. He has had no edema noted. Patient has no headaches and no stiffness of his neck. Patient states that he has no syncope and no chest pain noted.   Review of Systems:  Constitutional:  No weight loss, ++sweats, ++Fevers, ++chills, ++fatigue.  HEENT:  No headaches, Difficulty swallowing,Tooth/dental problems,Sore throat Cardio-vascular:  No chest pain, Orthopnea, PND GI:  No heartburn, indigestion, abdominal pain, nausea, vomiting, diarrhea  Resp:  No shortness of breath with exertion or at rest. No excess mucus, no productive cough  Skin:  no rash or lesions. ++sweeling and induration of elbow GU:  no dysuria, change in color of urine, no urgency or frequency. No flank pain.  Musculoskeletal:  ++ elbow joint pain and swelling. No decreased range of motion. No back pain.  Psych:  No change in mood or affect. No depression or anxiety. No memory loss.   Past Medical History  Diagnosis Date  . Cataract     left eye  . Numbness     both hands pt states pinched  . Carpal tunnel syndrome   . History of gout   . Psoriasis   . Enlarged prostate   . History of kidney stones     pt has one now but not giving him any problems   Past Surgical History  Procedure Laterality Date  . Tonsillectomy      as child  . Right  cataract removed     . Colonoscopy    . Cataract extraction w/phaco Left 03/15/2013    Procedure: CATARACT EXTRACTION PHACO AND INTRAOCULAR LENS PLACEMENT (IOC);  Surgeon: Adonis Brook, MD;  Location: [facility];  Service: Ophthalmology;  Laterality: Left;   Social History:  reports that he has quit smoking. His smoking use included Cigars. He does not have any smokeless tobacco history on file. He reports that he does not drink alcohol or use illicit drugs.  No Known Allergies  No family history on file.   Prior to Admission medications   Medication Sig Start Date End Date Taking? Authorizing Provider  ENSURE (ENSURE) Take 237 mLs by mouth daily.   Yes Historical Provider, MD  Famotidine-Ca Carb-Mag Hydrox (ACID REDUCER COMPLETE PO) Take 1 tablet by mouth daily.   Yes Historical Provider, MD   Physical Exam: Filed Vitals:   11/24/13 2331  BP:   Pulse:   Temp: 100.6 F (38.1 C)  Resp:     BP 136/56  Pulse 94  Temp(Src) 100.6 F (38.1 C) (Oral)  Resp 20  SpO2 96%  General:  Appears calm and comfortable Eyes: PERRL, normal lids, irises & conjunctiva ENT: grossly normal hearing, lips & tongue Neck: no LAD, masses or thyromegaly Cardiovascular: RRR, ++murmur no r/g. No LE edema. Respiratory: CTA bilaterally, no w/r/r. Normal respiratory effort. Abdomen:  soft, ntnd Skin: no rash or induration seen on limited exam Musculoskeletal: grossly normal tone BUE/BLE Psychiatric: grossly normal mood and affect, speech fluent and appropriate Neurologic: grossly non-focal.          Labs on Admission:  Basic Metabolic Panel:  Recent Labs Lab 11/24/13 2210  NA 135*  K 4.2  CL 97  CO2 24  GLUCOSE 112*  BUN 25*  CREATININE 1.12  CALCIUM 9.1   Liver Function Tests:  Recent Labs Lab 11/24/13 2210  AST 20  ALT 13  ALKPHOS 54  BILITOT 0.3  PROT 7.6  ALBUMIN 3.6   No results found for this basename: LIPASE, AMYLASE,  in the last 168 hours No results found for this basename:  AMMONIA,  in the last 168 hours CBC:  Recent Labs Lab 11/24/13 2210  WBC 8.6  HGB 11.4*  HCT 33.6*  MCV 85.5  PLT 178   Cardiac Enzymes: No results found for this basename: CKTOTAL, CKMB, CKMBINDEX, TROPONINI,  in the last 168 hours  BNP (last 3 results) No results found for this basename: PROBNP,  in the last 8760 hours CBG: No results found for this basename: GLUCAP,  in the last 168 hours  Radiological Exams on Admission: Dg Chest 2 View  11/24/2013   CLINICAL DATA:  79 year old male with fever. Initial encounter.  EXAM: CHEST  2 VIEW  COMPARISON:  Chest CT 11/05/2008.  Chest radiographs 10/29/2008.  FINDINGS: Stable lung volumes. Normal cardiac size and mediastinal contours. Visualized tracheal air column is within normal limits. No pneumothorax, pulmonary edema, pleural effusion or consolidation. There is streaky left lower lobe opacity better seen on the frontal view. No acute osseous abnormality identified.  IMPRESSION: Mild streaky left lower lobe opacity could reflect atelectasis, but is suspicious for developing pneumonia in this setting.   Electronically Signed   By: Lars Pinks M.D.   On: 11/24/2013 22:38   Dg Elbow Complete Right  11/24/2013   CLINICAL DATA:  Posterior right elbow pain and swelling.  EXAM: RIGHT ELBOW - COMPLETE 3+ VIEW  COMPARISON:  None.  FINDINGS: No acute fracture or dislocation. No joint effusion. Prominent degenerative spurring present at the posterior olecranon. Degenerative changes also noted at the medial epicondyle and radioulnar articulation.  Prominent soft tissue swelling seen at the posterior aspect of the elbow, likely related to provided history of bursitis. Possible cellulitis/infection could also have this appearance. No soft tissue emphysema. No retained foreign body.  Osseous mineralization is normal.  IMPRESSION: 1. Prominent soft tissue swelling at the posterior aspect of the elbow, likely related to provided history of bursitis. Possible  infection could also have this appearance in the correct clinical setting. No soft tissue emphysema or retained foreign body. 2. No acute fracture or dislocation.   Electronically Signed   By: Jeannine Boga M.D.   On: 11/24/2013 23:13     Assessment/Plan Principal Problem:   Community acquired pneumonia Active Problems:   Olecranon bursitis of right elbow   Murmur   Pneumonia   1. Community Acquired Pneumonia -will start on levaquin and in addition he is on vancomycin -cultures have been drawn -follow up Chest Xray as needed  2. Acute bursitis of elbow -has significant induration and swelling of the right elbow on exam and radiologically -will start on vancomycin  3. Murmur -reportedly new however the patient states that he has been told he has a murmur in the past -will get an echo in the morning    Code  Status: Full Code (must indicate code status--if unknown or must be presumed, indicate so) Family Communication: Son and [place] daughter in room (indicate person spoken with, if applicable, with phone number if by telephone) Disposition Plan: Home (indicate anticipated LOS)  Time spent: 35min  Saadat A Khan [facility] Pager [phone]  **Disclaimer: This note may have been dictated with voice recognition software. Similar sounding words can inadvertently be transcribed and this note may contain transcription errors which may not have been corrected upon publication of note.**

## 2013-11-24 NOTE — ED Notes (Signed)
Patient given a urinal and made aware that ua sample is needed.

## 2013-11-24 NOTE — ED Provider Notes (Signed)
CSN: [id number]     Arrival date & time 11/24/13  2054 History   First MD Initiated Contact with Patient 11/24/13 2140     Chief Complaint  Patient presents with  . Fever     (Consider location/radiation/quality/duration/timing/severity/associated sxs/prior Treatment) Patient is a 79 y.o. male presenting with fever. The history is provided by the patient.  Fever Max temp prior to arrival:  101.4 Temp source:  Oral Severity:  Moderate Onset quality:  Gradual Timing:  Intermittent Progression:  Unchanged Chronicity:  New Relieved by:  Nothing Worsened by:  Nothing tried Associated symptoms: confusion   Associated symptoms: no cough, no nausea and no vomiting     Past Medical History  Diagnosis Date  . Cataract     left eye  . Numbness     both hands pt states pinched  . Carpal tunnel syndrome   . History of gout   . Psoriasis   . Enlarged prostate   . History of kidney stones     pt has one now but not giving him any problems   Past Surgical History  Procedure Laterality Date  . Tonsillectomy      as child  . Right cataract removed     . Colonoscopy    . Cataract extraction w/phaco Left 03/15/2013    Procedure: CATARACT EXTRACTION PHACO AND INTRAOCULAR LENS PLACEMENT (IOC);  Surgeon: Adonis Brook, MD;  Location: [facility];  Service: Ophthalmology;  Laterality: Left;   No family history on file. History  Substance Use Topics  . Smoking status: Former Smoker    Types: Cigars  . Smokeless tobacco: Not on file     Comment: smoked cigars 37yrs ago  . Alcohol Use: No    Review of Systems  Constitutional: Positive for fever.  Respiratory: Negative for cough and shortness of breath.   Gastrointestinal: Negative for nausea, vomiting and abdominal pain.  Psychiatric/Behavioral: Positive for confusion.  All other systems reviewed and are negative.     Allergies  Review of patient's allergies indicates no known allergies.  Home Medications   Prior to Admission  medications   Medication Sig Start Date End Date Taking? Authorizing Provider  ENSURE (ENSURE) Take 237 mLs by mouth daily.   Yes Historical Provider, MD  Famotidine-Ca Carb-Mag Hydrox (ACID REDUCER COMPLETE PO) Take 1 tablet by mouth daily.   Yes Historical Provider, MD   BP 136/56  Pulse 94  Temp(Src) 101.4 F (38.6 C) (Oral)  Resp 20  SpO2 96% Physical Exam  Nursing note and vitals reviewed. Constitutional: He appears well-developed and well-nourished. He appears listless. No distress.  HENT:  Head: Normocephalic and atraumatic.  Mouth/Throat: Oropharynx is clear and moist. No oropharyngeal exudate.  Eyes: EOM are normal. Pupils are equal, round, and reactive to light.  Neck: Normal range of motion. Neck supple.  Cardiovascular: Normal rate and regular rhythm.  Exam reveals no friction rub.   Murmur (2/6) heard. Pulmonary/Chest: Effort normal and breath sounds normal. No respiratory distress. He has no wheezes. He has no rales.  Abdominal: He exhibits no distension. There is no tenderness. There is no rebound.  Musculoskeletal: Normal range of motion. He exhibits no edema.  Neurological: He appears listless. GCS eye subscore is 4. GCS verbal subscore is 5. GCS motor subscore is 6.  Skin: No rash noted. He is not diaphoretic.    ED Course  Procedures (including critical care time) Labs Review Labs Reviewed  CBC - Abnormal; Notable for the following:  RBC 3.93 (*)    Hemoglobin 11.4 (*)    HCT 33.6 (*)    All other components within normal limits  CULTURE, BLOOD (ROUTINE X 2)  CULTURE, BLOOD (ROUTINE X 2)  COMPREHENSIVE METABOLIC PANEL  URINALYSIS, ROUTINE W REFLEX MICROSCOPIC  I-STAT CG4 LACTIC ACID, ED    Imaging Review Dg Chest 2 View  11/24/2013   CLINICAL DATA:  79 year old male with fever. Initial encounter.  EXAM: CHEST  2 VIEW  COMPARISON:  Chest CT 11/05/2008.  Chest radiographs 10/29/2008.  FINDINGS: Stable lung volumes. Normal cardiac size and mediastinal  contours. Visualized tracheal air column is within normal limits. No pneumothorax, pulmonary edema, pleural effusion or consolidation. There is streaky left lower lobe opacity better seen on the frontal view. No acute osseous abnormality identified.  IMPRESSION: Mild streaky left lower lobe opacity could reflect atelectasis, but is suspicious for developing pneumonia in this setting.   Electronically Signed   By: Lars Pinks M.D.   On: 11/24/2013 22:38   Dg Elbow Complete Right  11/24/2013   CLINICAL DATA:  Posterior right elbow pain and swelling.  EXAM: RIGHT ELBOW - COMPLETE 3+ VIEW  COMPARISON:  None.  FINDINGS: No acute fracture or dislocation. No joint effusion. Prominent degenerative spurring present at the posterior olecranon. Degenerative changes also noted at the medial epicondyle and radioulnar articulation.  Prominent soft tissue swelling seen at the posterior aspect of the elbow, likely related to provided history of bursitis. Possible cellulitis/infection could also have this appearance. No soft tissue emphysema. No retained foreign body.  Osseous mineralization is normal.  IMPRESSION: 1. Prominent soft tissue swelling at the posterior aspect of the elbow, likely related to provided history of bursitis. Possible infection could also have this appearance in the correct clinical setting. No soft tissue emphysema or retained foreign body. 2. No acute fracture or dislocation.   Electronically Signed   By: Jeannine Boga M.D.   On: 11/24/2013 23:13     EKG Interpretation None      MDM   Final diagnoses:  Fever  Community acquired pneumonia  Olecranon bursitis of right elbow    5M presents with fever, mild confusion. Began today. Not acting like himself. Temp 101.4 here. Having some R elbow pain - R elbow bursa inflamed, normal joint ROM, unlikely septic arthritis, likely septic bursitis. Slow to respond, but appropriate and answering questions. Confusion described as having  difficulty getting into the house and having some memory issues. Lungs clear, belly benign. HEENT exam benign. No nuchal rigidity. R elbow bursa warm, fluctuant. No drainage. Normal R elbow ROM. Concern for septic bursitis. 2/6 murmur noted also. Will do broad infectious workup.  CXR with concern for early pneumonia. No white count. Will treat with Rocephin/Azithro. Treated with Vanc for bursitis. Dr. Humphrey Rolls with medicine admitting.    Osvaldo Shipper, MD 11/24/13 [phone]

## 2013-11-24 NOTE — ED Notes (Addendum)
Pt's son reports that pt has not been acting like himself today.  Reports that pt have been disoriented and was having trouble finding his keys to his house.   Pt reports R elbow pain but does not recall hitting it anywhere.

## 2013-11-25 DIAGNOSIS — R509 Fever, unspecified: Secondary | ICD-10-CM

## 2013-11-25 DIAGNOSIS — M7020 Olecranon bursitis, unspecified elbow: Secondary | ICD-10-CM

## 2013-11-25 DIAGNOSIS — I059 Rheumatic mitral valve disease, unspecified: Secondary | ICD-10-CM

## 2013-11-25 DIAGNOSIS — R011 Cardiac murmur, unspecified: Secondary | ICD-10-CM

## 2013-11-25 DIAGNOSIS — J189 Pneumonia, unspecified organism: Principal | ICD-10-CM

## 2013-11-25 LAB — COMPREHENSIVE METABOLIC PANEL
ALBUMIN: 3.3 g/dL — AB (ref 3.5–5.2)
ALK PHOS: 51 U/L (ref 39–117)
ALT: 11 U/L (ref 0–53)
AST: 20 U/L (ref 0–37)
BILIRUBIN TOTAL: 0.5 mg/dL (ref 0.3–1.2)
BUN: 23 mg/dL (ref 6–23)
CHLORIDE: 99 meq/L (ref 96–112)
CO2: 27 mEq/L (ref 19–32)
Calcium: 9.1 mg/dL (ref 8.4–10.5)
Creatinine, Ser: 1.14 mg/dL (ref 0.50–1.35)
GFR calc non Af Amer: 60 mL/min — ABNORMAL LOW (ref >90)
GFR, EST AFRICAN AMERICAN: 70 mL/min — AB (ref >90)
GLUCOSE: 98 mg/dL (ref 70–99)
POTASSIUM: 4 meq/L (ref 3.7–5.3)
Sodium: 136 mEq/L — ABNORMAL LOW (ref 137–147)
Total Protein: 7.3 g/dL (ref 6.0–8.3)

## 2013-11-25 LAB — CBC
HCT: 34.3 % — ABNORMAL LOW (ref 39.0–52.0)
Hemoglobin: 11.4 g/dL — ABNORMAL LOW (ref 13.0–17.0)
MCH: 28.8 pg (ref 26.0–34.0)
MCHC: 33.2 g/dL (ref 30.0–36.0)
MCV: 86.6 fL (ref 78.0–100.0)
Platelets: 158 10*3/uL (ref 150–400)
RBC: 3.96 MIL/uL — ABNORMAL LOW (ref 4.22–5.81)
RDW: 13.5 % (ref 11.5–15.5)
WBC: 7.4 10*3/uL (ref 4.0–10.5)

## 2013-11-25 LAB — HEMOGLOBIN A1C
Hgb A1c MFr Bld: 5.8 % — ABNORMAL HIGH (ref <5.7)
Mean Plasma Glucose: 120 mg/dL — ABNORMAL HIGH (ref <117)

## 2013-11-25 LAB — TSH: TSH: 0.91 u[IU]/mL (ref 0.350–4.500)

## 2013-11-25 LAB — GLUCOSE, CAPILLARY: Glucose-Capillary: 97 mg/dL (ref 70–99)

## 2013-11-25 MED ORDER — DOCUSATE SODIUM 100 MG PO CAPS
100.0000 mg | ORAL_CAPSULE | Freq: Two times a day (BID) | ORAL | Status: DC
  Administered 2013-11-25 – 2013-11-27 (×4): 100 mg via ORAL
  Filled 2013-11-25 (×6): qty 1

## 2013-11-25 MED ORDER — ACETAMINOPHEN 650 MG RE SUPP: 650.0000 mg | Freq: Four times a day (QID) | RECTAL | Status: DC | PRN

## 2013-11-25 MED ORDER — FOLIC ACID 1 MG PO TABS
1.0000 mg | ORAL_TABLET | Freq: Every day | ORAL | Status: DC
  Administered 2013-11-25 – 2013-11-27 (×3): 1 mg via ORAL
  Filled 2013-11-25 (×3): qty 1

## 2013-11-25 MED ORDER — ASPIRIN EC 325 MG PO TBEC
325.0000 mg | DELAYED_RELEASE_TABLET | Freq: Every day | ORAL | Status: DC
  Administered 2013-11-25 – 2013-11-27 (×3): 325 mg via ORAL
  Filled 2013-11-25 (×3): qty 1

## 2013-11-25 MED ORDER — VANCOMYCIN HCL IN DEXTROSE 750-5 MG/150ML-% IV SOLN
750.0000 mg | Freq: Two times a day (BID) | INTRAVENOUS | Status: DC
  Administered 2013-11-25 – 2013-11-26 (×5): 750 mg via INTRAVENOUS
  Filled 2013-11-25 (×7): qty 150

## 2013-11-25 MED ORDER — POLYETHYLENE GLYCOL 3350 17 G PO PACK
17.0000 g | PACK | Freq: Every day | ORAL | Status: DC | PRN
  Filled 2013-11-25: qty 1

## 2013-11-25 MED ORDER — HEPARIN SODIUM (PORCINE) 5000 UNIT/ML IJ SOLN
5000.0000 [IU] | Freq: Three times a day (TID) | INTRAMUSCULAR | Status: DC
  Administered 2013-11-25 – 2013-11-27 (×7): 5000 [IU] via SUBCUTANEOUS
  Filled 2013-11-25 (×10): qty 1

## 2013-11-25 MED ORDER — ACETAMINOPHEN 325 MG PO TABS
650.0000 mg | ORAL_TABLET | Freq: Four times a day (QID) | ORAL | Status: DC | PRN
  Filled 2013-11-25: qty 2

## 2013-11-25 MED ORDER — MORPHINE SULFATE 2 MG/ML IJ SOLN: 1.0000 mg | INTRAMUSCULAR | Status: DC | PRN

## 2013-11-25 MED ORDER — BISACODYL 10 MG RE SUPP: 10.0000 mg | Freq: Every day | RECTAL | Status: DC | PRN

## 2013-11-25 MED ORDER — ALUM & MAG HYDROXIDE-SIMETH 200-200-20 MG/5ML PO SUSP: 30.0000 mL | Freq: Four times a day (QID) | ORAL | Status: DC | PRN

## 2013-11-25 MED ORDER — ONDANSETRON HCL 4 MG PO TABS: 4.0000 mg | ORAL_TABLET | Freq: Four times a day (QID) | ORAL | Status: DC | PRN

## 2013-11-25 MED ORDER — HYDROCODONE-ACETAMINOPHEN 5-325 MG PO TABS
1.0000 | ORAL_TABLET | ORAL | Status: DC | PRN
  Administered 2013-11-25 – 2013-11-26 (×2): 1 via ORAL
  Filled 2013-11-25 (×2): qty 1

## 2013-11-25 MED ORDER — ADULT MULTIVITAMIN W/MINERALS CH
1.0000 | ORAL_TABLET | Freq: Every day | ORAL | Status: DC
  Administered 2013-11-25 – 2013-11-27 (×3): 1 via ORAL
  Filled 2013-11-25 (×3): qty 1

## 2013-11-25 MED ORDER — SODIUM CHLORIDE 0.9 % IV SOLN
INTRAVENOUS | Status: DC
  Administered 2013-11-25: 02:00:00 via INTRAVENOUS

## 2013-11-25 MED ORDER — MAGNESIUM CITRATE PO SOLN: 1.0000 | Freq: Once | ORAL | Status: AC | PRN

## 2013-11-25 MED ORDER — ONDANSETRON HCL 4 MG/2ML IJ SOLN: 4.0000 mg | Freq: Four times a day (QID) | INTRAMUSCULAR | Status: DC | PRN

## 2013-11-25 MED ORDER — VITAMIN B-1 100 MG PO TABS
100.0000 mg | ORAL_TABLET | Freq: Every day | ORAL | Status: DC
  Administered 2013-11-25 – 2013-11-27 (×3): 100 mg via ORAL
  Filled 2013-11-25 (×3): qty 1

## 2013-11-25 MED ORDER — LEVOFLOXACIN IN D5W 750 MG/150ML IV SOLN
750.0000 mg | Freq: Every day | INTRAVENOUS | Status: DC
  Administered 2013-11-25 – 2013-11-26 (×3): 750 mg via INTRAVENOUS
  Filled 2013-11-25 (×4): qty 150

## 2013-11-25 NOTE — Progress Notes (Signed)
[facility] PROGRESS NOTE   Steven [name] [id number]:[id number] DOB: 11-04-1934 DOA: 11/24/2013 PCP: Gennette Pac, MD  HPI/Subjective: Feels much better, minimal sputum and cough. Shortness of breath is worse with ambulation.  Assessment/Plan: Principal Problem:   Community acquired pneumonia Active Problems:   Olecranon bursitis of right elbow   Murmur   Pneumonia   Community Acquired Pneumonia -will start on levaquin and in addition he is on vancomycin  -cultures have been drawn  -follow up Chest Xray as needed   Acute bursitis of elbow -has significant induration and swelling of the right elbow on exam and radiologically  -will start on vancomycin. -I will obtain an MRI of the right elbow to rule out infections around the joint.  Murmur -reportedly new however the patient states that he has been told he has a murmur in the past  -will get an echo in the morning   Code Status: Full code Family Communication: Plan discussed with the patient. Disposition Plan: Remains inpatient   Consultants:  None  Procedures:  None  Antibiotics:  Levaquin and vancomycin.   Objective: Filed Vitals:   11/25/13 0619  BP: 120/63  Pulse: 79  Temp: 99.3 F (37.4 C)  Resp: 18    Intake/Output Summary (Last 24 hours) at 11/25/13 1245 Last data filed at 11/25/13 0827  Gross per 24 hour  Intake    665 ml  Output   1780 ml  Net  -1115 ml   Filed Weights   11/25/13 0123  Weight: 91.899 kg (202 lb 9.6 oz)    Exam: General: Alert and awake, oriented x3, not in any acute distress. HEENT: anicteric sclera, pupils reactive to light and accommodation, EOMI CVS: S1-S2 clear, no murmur rubs or gallops Chest: clear to auscultation bilaterally, no wheezing, rales or rhonchi Abdomen: soft nontender, nondistended, normal bowel sounds, no organomegaly Extremities: no cyanosis, clubbing or edema noted bilaterally Neuro: Cranial nerves II-XII intact, no focal  neurological deficits  Data Reviewed: Basic Metabolic Panel:  Recent Labs Lab 11/24/13 2210 11/25/13 0526  NA 135* 136*  K 4.2 4.0  CL 97 99  CO2 24 27  GLUCOSE 112* 98  BUN 25* 23  CREATININE 1.12 1.14  CALCIUM 9.1 9.1   Liver Function Tests:  Recent Labs Lab 11/24/13 2210 11/25/13 0526  AST 20 20  ALT 13 11  ALKPHOS 54 51  BILITOT 0.3 0.5  PROT 7.6 7.3  ALBUMIN 3.6 3.3*   No results found for this basename: LIPASE, AMYLASE,  in the last 168 hours No results found for this basename: AMMONIA,  in the last 168 hours CBC:  Recent Labs Lab 11/24/13 2210 11/25/13 0526  WBC 8.6 7.4  HGB 11.4* 11.4*  HCT 33.6* 34.3*  MCV 85.5 86.6  PLT 178 158   Cardiac Enzymes: No results found for this basename: CKTOTAL, CKMB, CKMBINDEX, TROPONINI,  in the last 168 hours BNP (last 3 results) No results found for this basename: PROBNP,  in the last 8760 hours CBG:  Recent Labs Lab 11/25/13 0718  GLUCAP 97    Micro No results found for this or any previous visit (from the past 240 hour(s)).   Studies: Dg Chest 2 View  11/24/2013   CLINICAL DATA:  79 year old male with fever. Initial encounter.  EXAM: CHEST  2 VIEW  COMPARISON:  Chest CT 11/05/2008.  Chest radiographs 10/29/2008.  FINDINGS: Stable lung volumes. Normal cardiac size and mediastinal contours. Visualized tracheal air column is within normal limits. No pneumothorax, pulmonary  edema, pleural effusion or consolidation. There is streaky left lower lobe opacity better seen on the frontal view. No acute osseous abnormality identified.  IMPRESSION: Mild streaky left lower lobe opacity could reflect atelectasis, but is suspicious for developing pneumonia in this setting.   Electronically Signed   By: Lars Pinks M.D.   On: 11/24/2013 22:38   Dg Elbow Complete Right  11/24/2013   CLINICAL DATA:  Posterior right elbow pain and swelling.  EXAM: RIGHT ELBOW - COMPLETE 3+ VIEW  COMPARISON:  None.  FINDINGS: No acute fracture or  dislocation. No joint effusion. Prominent degenerative spurring present at the posterior olecranon. Degenerative changes also noted at the medial epicondyle and radioulnar articulation.  Prominent soft tissue swelling seen at the posterior aspect of the elbow, likely related to provided history of bursitis. Possible cellulitis/infection could also have this appearance. No soft tissue emphysema. No retained foreign body.  Osseous mineralization is normal.  IMPRESSION: 1. Prominent soft tissue swelling at the posterior aspect of the elbow, likely related to provided history of bursitis. Possible infection could also have this appearance in the correct clinical setting. No soft tissue emphysema or retained foreign body. 2. No acute fracture or dislocation.   Electronically Signed   By: Jeannine Boga M.D.   On: 11/24/2013 23:13    Scheduled Meds: . aspirin EC  325 mg Oral Daily  . docusate sodium  100 mg Oral BID  . folic acid  1 mg Oral Daily  . heparin  5,000 Units Subcutaneous 3 times per day  . levofloxacin (LEVAQUIN) IV  750 mg Intravenous QHS  . multivitamin with minerals  1 tablet Oral Daily  . thiamine  100 mg Oral Daily  . vancomycin  750 mg Intravenous BID   Continuous Infusions: . sodium chloride 75 mL/hr at 11/25/13 0208       Time spent: 35 minutes    Verlee Monte  [facility] Pager [phone] If 7PM-7AM, please contact night-coverage at [email or web address], password [facility] 11/25/2013, 12:45 PM  LOS: 1 day

## 2013-11-25 NOTE — Progress Notes (Signed)
Pt. A&OX4 VSS low grade temp 99 on arrival to floor. Son with pt. , pt. Oriented to floor voiced no c/o pain IVAB infusing as ordered pt in no distress will continue to monitor.

## 2013-11-25 NOTE — Progress Notes (Signed)
  Echocardiogram 2D Echocardiogram has been performed.  Doyle Askew 11/25/2013, 10:55 AM

## 2013-11-25 NOTE — Progress Notes (Signed)
ANTIBIOTIC CONSULT NOTE - INITIAL  Pharmacy Consult for Vancomycin, levofloxacin Indication: CAP, Olecranon bursitis of right elbow  No Known Allergies  Patient Measurements: Height: 6' (182.9 cm) Weight: 202 lb 9.6 oz (91.899 kg) IBW/kg (Calculated) : 77.6 Adjusted Body Weight:   Vital Signs: Temp: 99 F (37.2 C) (05/30 0123) Temp src: Oral (05/30 0123) BP: 131/66 mmHg (05/30 0123) Pulse Rate: 74 (05/30 0123) Intake/Output from previous day: 05/29 0701 - 05/30 0700 In: -  Out: 280 [Urine:280] Intake/Output from this shift: Total I/O In: -  Out: 280 [Urine:280]  Labs:  Recent Labs  11/24/13 2210  WBC 8.6  HGB 11.4*  PLT 178  CREATININE 1.12   Estimated Creatinine Clearance: 60.6 ml/min (by C-G formula based on Cr of 1.12). No results found for this basename: VANCOTROUGH, VANCOPEAK, VANCORANDOM, GENTTROUGH, GENTPEAK, GENTRANDOM, TOBRATROUGH, TOBRAPEAK, TOBRARND, AMIKACINPEAK, AMIKACINTROU, AMIKACIN,  in the last 72 hours   Microbiology: No results found for this or any previous visit (from the past 720 hour(s)).  Medical History: Past Medical History  Diagnosis Date  . Cataract     left eye  . Numbness     both hands pt states pinched  . Carpal tunnel syndrome   . History of gout   . Psoriasis   . Enlarged prostate   . History of kidney stones     pt has one now but not giving him any problems    Medications:  Anti-infectives   Start     Dose/Rate Route Frequency Ordered Stop   11/25/13 0145  vancomycin (VANCOCIN) IVPB 750 mg/150 ml premix     750 mg 150 mL/hr over 60 Minutes Intravenous 2 times daily 11/25/13 0142     11/25/13 0145  levofloxacin (LEVAQUIN) IVPB 750 mg     750 mg 100 mL/hr over 90 Minutes Intravenous Daily at bedtime 11/25/13 0142     11/24/13 2245  cefTRIAXone (ROCEPHIN) 1 g in dextrose 5 % 50 mL IVPB     1 g 100 mL/hr over 30 Minutes Intravenous  Once 11/24/13 2243 11/25/13 0004   11/24/13 2245  azithromycin (ZITHROMAX) 500 mg  in dextrose 5 % 250 mL IVPB  Status:  Discontinued     500 mg 250 mL/hr over 60 Minutes Intravenous  Once 11/24/13 2243 11/25/13 0104   11/24/13 2245  vancomycin (VANCOCIN) IVPB 1000 mg/200 mL premix  Status:  Discontinued     1,000 mg 200 mL/hr over 60 Minutes Intravenous  Once 11/24/13 2243 11/25/13 0104     Assessment: Patient with CAP and Olecranon bursitis of right elbow.  Vancomycin ordered but not charted in ED.    Goal of Therapy:  Vancomycin trough level 15-20 mcg/ml Levofloxacin dosed based on patient weight and renal function  Plan:  Measure antibiotic drug levels at steady state Follow up culture results Vancomycin 750mg  iv q12hr Levofloxacin 750mg  iv q24hr      [employer]. 11/25/2013,1:44 AM

## 2013-11-26 LAB — BASIC METABOLIC PANEL
BUN: 21 mg/dL (ref 6–23)
CHLORIDE: 99 meq/L (ref 96–112)
CO2: 24 meq/L (ref 19–32)
Calcium: 9 mg/dL (ref 8.4–10.5)
Creatinine, Ser: 1.2 mg/dL (ref 0.50–1.35)
GFR calc Af Amer: 66 mL/min — ABNORMAL LOW (ref >90)
GFR calc non Af Amer: 57 mL/min — ABNORMAL LOW (ref >90)
GLUCOSE: 103 mg/dL — AB (ref 70–99)
POTASSIUM: 4.1 meq/L (ref 3.7–5.3)
SODIUM: 136 meq/L — AB (ref 137–147)

## 2013-11-26 LAB — CBC
HEMATOCRIT: 34 % — AB (ref 39.0–52.0)
HEMOGLOBIN: 11.2 g/dL — AB (ref 13.0–17.0)
MCH: 28.5 pg (ref 26.0–34.0)
MCHC: 32.9 g/dL (ref 30.0–36.0)
MCV: 86.5 fL (ref 78.0–100.0)
Platelets: 151 10*3/uL (ref 150–400)
RBC: 3.93 MIL/uL — AB (ref 4.22–5.81)
RDW: 13.6 % (ref 11.5–15.5)
WBC: 6.7 10*3/uL (ref 4.0–10.5)

## 2013-11-26 LAB — GLUCOSE, CAPILLARY: Glucose-Capillary: 145 mg/dL — ABNORMAL HIGH (ref 70–99)

## 2013-11-26 MED ORDER — INDOMETHACIN ER 75 MG PO CPCR
75.0000 mg | ORAL_CAPSULE | Freq: Two times a day (BID) | ORAL | Status: DC
  Administered 2013-11-26 – 2013-11-27 (×3): 75 mg via ORAL
  Filled 2013-11-26 (×5): qty 1

## 2013-11-26 NOTE — Progress Notes (Signed)
[facility] PROGRESS NOTE   Steven [name] [id number]:[id number] DOB: [birth year] DOA: 11/24/2013 PCP: Gennette Pac, MD  HPI/Subjective: Continues to feel better, still has fever.  Assessment/Plan: Principal Problem:   Community acquired pneumonia Active Problems:   Olecranon bursitis of right elbow   Murmur   Pneumonia   Community Acquired Pneumonia -will start on levaquin and in addition he is on vancomycin  -cultures have been drawn  -LLL early pneumonia, continue current respiratory regimen.  Acute bursitis of elbow -has significant induration and swelling of the right elbow on exam and radiologically  -will start on vancomycin. -I will obtain an MRI of the right elbow to rule out infections around the joint. Still pending. -Patient mentioned he has gout and that was feeling like it. I will add Indocin.  Murmur -reportedly new however the patient states that he has been told he has a murmur in the past  -2-D echo showed mild mitral regurg.   Code Status: Full code Family Communication: Plan discussed with the patient. Disposition Plan: Remains inpatient   Consultants:  None  Procedures:  None  Antibiotics:  Levaquin and vancomycin.   Objective: Filed Vitals:   11/26/13 0554  BP: 166/74  Pulse: 84  Temp: 100 F (37.8 C)  Resp: 18    Intake/Output Summary (Last 24 hours) at 11/26/13 1131 Last data filed at 11/26/13 0600  Gross per 24 hour  Intake    900 ml  Output    850 ml  Net     50 ml   Filed Weights   11/25/13 0123  Weight: 91.899 kg (202 lb 9.6 oz)    Exam: General: Alert and awake, oriented x3, not in any acute distress. HEENT: anicteric sclera, pupils reactive to light and accommodation, EOMI CVS: S1-S2 clear, no murmur rubs or gallops Chest: clear to auscultation bilaterally, no wheezing, rales or rhonchi Abdomen: soft nontender, nondistended, normal bowel sounds, no organomegaly Extremities: no cyanosis, clubbing or  edema noted bilaterally Neuro: Cranial nerves II-XII intact, no focal neurological deficits  Data Reviewed: Basic Metabolic Panel:  Recent Labs Lab 11/24/13 2210 11/25/13 0526 11/26/13 0520  NA 135* 136* 136*  K 4.2 4.0 4.1  CL 97 99 99  CO2 24 27 24   GLUCOSE 112* 98 103*  BUN 25* 23 21  CREATININE 1.12 1.14 1.20  CALCIUM 9.1 9.1 9.0   Liver Function Tests:  Recent Labs Lab 11/24/13 2210 11/25/13 0526  AST 20 20  ALT 13 11  ALKPHOS 54 51  BILITOT 0.3 0.5  PROT 7.6 7.3  ALBUMIN 3.6 3.3*   No results found for this basename: LIPASE, AMYLASE,  in the last 168 hours No results found for this basename: AMMONIA,  in the last 168 hours CBC:  Recent Labs Lab 11/24/13 2210 11/25/13 0526 11/26/13 0520  WBC 8.6 7.4 6.7  HGB 11.4* 11.4* 11.2*  HCT 33.6* 34.3* 34.0*  MCV 85.5 86.6 86.5  PLT 178 158 151   Cardiac Enzymes: No results found for this basename: CKTOTAL, CKMB, CKMBINDEX, TROPONINI,  in the last 168 hours BNP (last 3 results) No results found for this basename: PROBNP,  in the last 8760 hours CBG:  Recent Labs Lab 11/25/13 0718 11/26/13 0956  GLUCAP 97 145*    Micro No results found for this or any previous visit (from the past 240 hour(s)).   Studies: Dg Chest 2 View  11/24/2013   CLINICAL DATA:  79 year old male with fever. Initial encounter.  EXAM: CHEST  2  VIEW  COMPARISON:  Chest CT 11/05/2008.  Chest radiographs 10/29/2008.  FINDINGS: Stable lung volumes. Normal cardiac size and mediastinal contours. Visualized tracheal air column is within normal limits. No pneumothorax, pulmonary edema, pleural effusion or consolidation. There is streaky left lower lobe opacity better seen on the frontal view. No acute osseous abnormality identified.  IMPRESSION: Mild streaky left lower lobe opacity could reflect atelectasis, but is suspicious for developing pneumonia in this setting.   Electronically Signed   By: Lars Pinks M.D.   On: 11/24/2013 22:38   Dg  Elbow Complete Right  11/24/2013   CLINICAL DATA:  Posterior right elbow pain and swelling.  EXAM: RIGHT ELBOW - COMPLETE 3+ VIEW  COMPARISON:  None.  FINDINGS: No acute fracture or dislocation. No joint effusion. Prominent degenerative spurring present at the posterior olecranon. Degenerative changes also noted at the medial epicondyle and radioulnar articulation.  Prominent soft tissue swelling seen at the posterior aspect of the elbow, likely related to provided history of bursitis. Possible cellulitis/infection could also have this appearance. No soft tissue emphysema. No retained foreign body.  Osseous mineralization is normal.  IMPRESSION: 1. Prominent soft tissue swelling at the posterior aspect of the elbow, likely related to provided history of bursitis. Possible infection could also have this appearance in the correct clinical setting. No soft tissue emphysema or retained foreign body. 2. No acute fracture or dislocation.   Electronically Signed   By: Jeannine Boga M.D.   On: 11/24/2013 23:13    Scheduled Meds: . aspirin EC  325 mg Oral Daily  . docusate sodium  100 mg Oral BID  . folic acid  1 mg Oral Daily  . heparin  5,000 Units Subcutaneous 3 times per day  . levofloxacin (LEVAQUIN) IV  750 mg Intravenous QHS  . multivitamin with minerals  1 tablet Oral Daily  . thiamine  100 mg Oral Daily  . vancomycin  750 mg Intravenous BID   Continuous Infusions: . sodium chloride 75 mL/hr at 11/26/13 0600       Time spent: 35 minutes    Verlee Monte  [facility] Pager [phone] If 7PM-7AM, please contact night-coverage at [email or web address], password [facility] 11/26/2013, 11:31 AM  LOS: 2 days

## 2013-11-27 LAB — GLUCOSE, CAPILLARY: GLUCOSE-CAPILLARY: 102 mg/dL — AB (ref 70–99)

## 2013-11-27 MED ORDER — LEVOFLOXACIN 750 MG PO TABS: 750.0000 mg | ORAL_TABLET | Freq: Every day | ORAL | Status: DC

## 2013-11-27 NOTE — Discharge Summary (Signed)
Physician Discharge Summary  Steven [name] [id number] DOB: Jan 18, 1935 DOA: 11/24/2013  PCP: Steven Pac, MD  Admit date: 11/24/2013 Discharge date: 11/27/2013  Time spent: 40 minutes  Recommendations for Outpatient Follow-up:  1. Followup with Dr. little and one week. 2. Followup with Dr. Janice [name] later today.  Discharge Diagnoses:  Principal Problem:   Community acquired pneumonia Active Problems:   Olecranon bursitis of right elbow   Murmur   Pneumonia   Discharge Condition: Stable  Diet recommendation: Regular  Filed Weights   11/25/13 0123  Weight: 91.899 kg (202 lb 9.6 oz)    History of present illness:  Steven [name] is a 79 y.o. male presents with fever and weakness. Patient states that he was at his baseline until about a day ago. He states that he noted that he was not able to open his door. Patient states that he has had a fever also up to 104F. Patient has no cough no congestion noted. He has had swelling of his right elbow noted for a few days. He does not recall any trauma to the area. Patient states that he has no abdominal complaints. He has had no diarrhea. He has had no edema noted. Patient has no headaches and no stiffness of his neck. Patient states that he has no syncope and no chest pain noted.  Hospital Course:   Community Acquired Pneumonia  -Patient started on Levaquin and vancomycin on admission. -Blood culture drawn, negative to date.  -LLL early pneumonia, discharged on Levaquin for 5 more days.  Acute bursitis of elbow  -has significant induration and swelling of the right elbow on exam and radiologically  -Patient was on vancomycin.  -I will obtain an MRI of the right elbow to rule out infections around the joint. For some reason was not done over the weekend. -Right elbow swelling decreased significantly, patient denies any pain. -Patient mentioned he has gout and that is how his gout usually flares up, indomethacin and was prescribed in  the hospital. -On the day of discharge patient does not have any pain, discharge home not on any medication for gout. -Patient says he does have gout medications at home, he'll followup with his primary care physician.  Murmur  -reportedly new however the patient states that he has been told he has a murmur in the past  -2-D echo showed mild mitral regurg.   Procedures:  None  Consultations:  None  Discharge Exam: Filed Vitals:   11/27/13 0529  BP: 127/66  Pulse: 67  Temp: 97.8 F (36.6 C)  Resp: 18   General: Alert and awake, oriented x3, not in any acute distress. HEENT: anicteric sclera, pupils reactive to light and accommodation, EOMI CVS: S1-S2 clear, no murmur rubs or gallops Chest: clear to auscultation bilaterally, no wheezing, rales or rhonchi Abdomen: soft nontender, nondistended, normal bowel sounds, no organomegaly Extremities: no cyanosis, clubbing or edema noted bilaterally Neuro: Cranial nerves II-XII intact, no focal neurological deficits  Discharge Instructions You were cared for by a hospitalist during your hospital stay. If you have any questions about your discharge medications or the care you received while you were in the hospital after you are discharged, you can call the unit and asked to speak with the hospitalist on call if the hospitalist that took care of you is not available. Once you are discharged, your primary care physician will handle any further medical issues. Please note that NO REFILLS for any discharge medications will be authorized once you are  discharged, as it is imperative that you return to your primary care physician (or establish a relationship with a primary care physician if you do not have one) for your aftercare needs so that they can reassess your need for medications and monitor your lab values.  Discharge Instructions   Increase activity slowly    Complete by:  As directed             Medication List         ACID  REDUCER COMPLETE PO  Take 1 tablet by mouth daily.     ENSURE  Take 237 mLs by mouth daily.     levofloxacin 750 MG tablet  Commonly known as:  LEVAQUIN  Take 1 tablet (750 mg total) by mouth daily.       No Known Allergies     Follow-up Information   Follow up with Steven Pac, MD In 1 week.   Specialty:  Family Medicine   Contact information:   [place] [address] [phone]       Follow up with Steven Persons, MD On 11/27/2013.   Specialty:  Urology   Contact information:   [place] [address] [phone]        The results of significant diagnostics from this hospitalization (including imaging, microbiology, ancillary and laboratory) are listed below for reference.    Significant Diagnostic Studies: Dg Chest 2 View  11/24/2013   CLINICAL DATA:  79 year old male with fever. Initial encounter.  EXAM: CHEST  2 VIEW  COMPARISON:  Chest CT 11/05/2008.  Chest radiographs 10/29/2008.  FINDINGS: Stable lung volumes. Normal cardiac size and mediastinal contours. Visualized tracheal air column is within normal limits. No pneumothorax, pulmonary edema, pleural effusion or consolidation. There is streaky left lower lobe opacity better seen on the frontal view. No acute osseous abnormality identified.  IMPRESSION: Mild streaky left lower lobe opacity could reflect atelectasis, but is suspicious for developing pneumonia in this setting.   Electronically Signed   By: Steven [name] M.D.   On: 11/24/2013 22:38   Dg Elbow Complete Right  11/24/2013   CLINICAL DATA:  Posterior right elbow pain and swelling.  EXAM: RIGHT ELBOW - COMPLETE 3+ VIEW  COMPARISON:  None.  FINDINGS: No acute fracture or dislocation. No joint effusion. Prominent degenerative spurring present at the posterior olecranon. Degenerative changes also noted at the medial epicondyle and radioulnar articulation.  Prominent soft tissue swelling seen at the posterior aspect of the elbow,  likely related to provided history of bursitis. Possible cellulitis/infection could also have this appearance. No soft tissue emphysema. No retained foreign body.  Osseous mineralization is normal.  IMPRESSION: 1. Prominent soft tissue swelling at the posterior aspect of the elbow, likely related to provided history of bursitis. Possible infection could also have this appearance in the correct clinical setting. No soft tissue emphysema or retained foreign body. 2. No acute fracture or dislocation.   Electronically Signed   By: Jeannine Boga M.D.   On: 11/24/2013 23:13    Microbiology: Recent Results (from the past 240 hour(s))  CULTURE, BLOOD (ROUTINE X 2)     Status: None   Collection Time    11/24/13 10:00 PM      Result Value Ref Range Status   Specimen Description BLOOD LEFT ARM   Final   Special Requests BOTTLES DRAWN AEROBIC AND ANAEROBIC 5CC   Final   Culture  Setup Time     Final  Value: 11/25/2013 00:45     Performed at [facility]   Culture     Final   Value:        BLOOD CULTURE RECEIVED NO GROWTH TO DATE CULTURE WILL BE HELD FOR 5 DAYS BEFORE ISSUING A FINAL NEGATIVE REPORT     Performed at [facility]   Report Status PENDING   Incomplete  CULTURE, BLOOD (ROUTINE X 2)     Status: None   Collection Time    11/24/13 10:38 PM      Result Value Ref Range Status   Specimen Description BLOOD RIGHT FEMM   Final   Special Requests BOTTLES DRAWN AEROBIC AND ANAEROBIC 4CC   Final   Culture  Setup Time     Final   Value: 11/25/2013 00:45     Performed at [facility]   Culture     Final   Value:        BLOOD CULTURE RECEIVED NO GROWTH TO DATE CULTURE WILL BE HELD FOR 5 DAYS BEFORE ISSUING A FINAL NEGATIVE REPORT     Performed at [facility]   Report Status PENDING   Incomplete     Labs: Basic Metabolic Panel:  Recent Labs Lab 11/24/13 2210 11/25/13 0526 11/26/13 0520  NA 135* 136* 136*  K 4.2 4.0 4.1  CL 97 99 99  CO2 24 27 24    GLUCOSE 112* 98 103*  BUN 25* 23 21  CREATININE 1.12 1.14 1.20  CALCIUM 9.1 9.1 9.0   Liver Function Tests:  Recent Labs Lab 11/24/13 2210 11/25/13 0526  AST 20 20  ALT 13 11  ALKPHOS 54 51  BILITOT 0.3 0.5  PROT 7.6 7.3  ALBUMIN 3.6 3.3*   No results found for this basename: LIPASE, AMYLASE,  in the last 168 hours No results found for this basename: AMMONIA,  in the last 168 hours CBC:  Recent Labs Lab 11/24/13 2210 11/25/13 0526 11/26/13 0520  WBC 8.6 7.4 6.7  HGB 11.4* 11.4* 11.2*  HCT 33.6* 34.3* 34.0*  MCV 85.5 86.6 86.5  PLT 178 158 151   Cardiac Enzymes: No results found for this basename: CKTOTAL, CKMB, CKMBINDEX, TROPONINI,  in the last 168 hours BNP: BNP (last 3 results) No results found for this basename: PROBNP,  in the last 8760 hours CBG:  Recent Labs Lab 11/25/13 0718 11/26/13 0956 11/27/13 0754  GLUCAP 97 145* 102*       Signed:  Shyquan [name]  [facility] 11/27/2013, 9:25 AM

## 2013-11-27 NOTE — Progress Notes (Signed)
All DC instructions reviewed with the pt along with follow up appts and PO antibiotic. All questions and concerns were addressed. Pt is alert and oriented times four, ambulatory and independent, VSS, skin intact, no c/o pain, no discomfort or distress noted at this time. Pt waiting on his ride. Will continue to monitor until DC from the unit.

## 2013-12-01 LAB — CULTURE, BLOOD (ROUTINE X 2)
Culture: NO GROWTH
Culture: NO GROWTH

## 2014-10-02 DIAGNOSIS — K088 Other specified disorders of teeth and supporting structures: Secondary | ICD-10-CM | POA: Diagnosis not present

## 2014-10-02 DIAGNOSIS — I351 Nonrheumatic aortic (valve) insufficiency: Secondary | ICD-10-CM | POA: Diagnosis not present

## 2014-10-02 DIAGNOSIS — M2660 Temporomandibular joint disorder, unspecified: Secondary | ICD-10-CM | POA: Diagnosis not present

## 2014-10-02 DIAGNOSIS — D649 Anemia, unspecified: Secondary | ICD-10-CM | POA: Diagnosis not present

## 2014-10-02 DIAGNOSIS — I499 Cardiac arrhythmia, unspecified: Secondary | ICD-10-CM | POA: Diagnosis not present

## 2014-10-02 DIAGNOSIS — R6884 Jaw pain: Secondary | ICD-10-CM | POA: Diagnosis not present

## 2014-10-02 DIAGNOSIS — N401 Enlarged prostate with lower urinary tract symptoms: Secondary | ICD-10-CM | POA: Diagnosis not present

## 2014-10-09 DIAGNOSIS — D649 Anemia, unspecified: Secondary | ICD-10-CM | POA: Diagnosis not present

## 2014-10-10 DIAGNOSIS — K088 Other specified disorders of teeth and supporting structures: Secondary | ICD-10-CM | POA: Diagnosis not present

## 2014-10-10 DIAGNOSIS — D649 Anemia, unspecified: Secondary | ICD-10-CM | POA: Diagnosis not present

## 2014-10-10 DIAGNOSIS — M2660 Temporomandibular joint disorder, unspecified: Secondary | ICD-10-CM | POA: Diagnosis not present

## 2014-10-10 DIAGNOSIS — N401 Enlarged prostate with lower urinary tract symptoms: Secondary | ICD-10-CM | POA: Diagnosis not present

## 2014-10-10 DIAGNOSIS — M5412 Radiculopathy, cervical region: Secondary | ICD-10-CM | POA: Diagnosis not present

## 2014-10-10 DIAGNOSIS — R6884 Jaw pain: Secondary | ICD-10-CM | POA: Diagnosis not present

## 2014-10-10 DIAGNOSIS — I351 Nonrheumatic aortic (valve) insufficiency: Secondary | ICD-10-CM | POA: Diagnosis not present

## 2014-10-10 DIAGNOSIS — I499 Cardiac arrhythmia, unspecified: Secondary | ICD-10-CM | POA: Diagnosis not present

## 2014-10-19 DIAGNOSIS — R011 Cardiac murmur, unspecified: Secondary | ICD-10-CM | POA: Diagnosis not present

## 2014-10-19 DIAGNOSIS — I491 Atrial premature depolarization: Secondary | ICD-10-CM | POA: Diagnosis not present

## 2014-10-19 DIAGNOSIS — I10 Essential (primary) hypertension: Secondary | ICD-10-CM | POA: Diagnosis not present

## 2014-10-19 DIAGNOSIS — E668 Other obesity: Secondary | ICD-10-CM | POA: Diagnosis not present

## 2014-10-22 DIAGNOSIS — H43812 Vitreous degeneration, left eye: Secondary | ICD-10-CM | POA: Diagnosis not present

## 2014-10-22 DIAGNOSIS — H04123 Dry eye syndrome of bilateral lacrimal glands: Secondary | ICD-10-CM | POA: Diagnosis not present

## 2014-11-05 DIAGNOSIS — I491 Atrial premature depolarization: Secondary | ICD-10-CM | POA: Diagnosis not present

## 2014-11-05 DIAGNOSIS — R011 Cardiac murmur, unspecified: Secondary | ICD-10-CM | POA: Diagnosis not present

## 2014-11-05 DIAGNOSIS — E668 Other obesity: Secondary | ICD-10-CM | POA: Diagnosis not present

## 2014-11-05 DIAGNOSIS — I350 Nonrheumatic aortic (valve) stenosis: Secondary | ICD-10-CM | POA: Diagnosis not present

## 2014-11-05 DIAGNOSIS — I10 Essential (primary) hypertension: Secondary | ICD-10-CM | POA: Diagnosis not present

## 2014-11-09 DIAGNOSIS — N289 Disorder of kidney and ureter, unspecified: Secondary | ICD-10-CM | POA: Diagnosis not present

## 2014-11-09 DIAGNOSIS — Z0000 Encounter for general adult medical examination without abnormal findings: Secondary | ICD-10-CM | POA: Diagnosis not present

## 2014-11-09 DIAGNOSIS — I351 Nonrheumatic aortic (valve) insufficiency: Secondary | ICD-10-CM | POA: Diagnosis not present

## 2014-11-09 DIAGNOSIS — I10 Essential (primary) hypertension: Secondary | ICD-10-CM | POA: Diagnosis not present

## 2014-11-09 DIAGNOSIS — N401 Enlarged prostate with lower urinary tract symptoms: Secondary | ICD-10-CM | POA: Diagnosis not present

## 2014-11-09 DIAGNOSIS — M109 Gout, unspecified: Secondary | ICD-10-CM | POA: Diagnosis not present

## 2014-11-09 DIAGNOSIS — D649 Anemia, unspecified: Secondary | ICD-10-CM | POA: Diagnosis not present

## 2014-11-09 DIAGNOSIS — Z23 Encounter for immunization: Secondary | ICD-10-CM | POA: Diagnosis not present

## 2014-11-22 ENCOUNTER — Other Ambulatory Visit: Payer: Self-pay | Admitting: Gastroenterology

## 2014-11-30 DIAGNOSIS — N401 Enlarged prostate with lower urinary tract symptoms: Secondary | ICD-10-CM | POA: Diagnosis not present

## 2014-11-30 DIAGNOSIS — R972 Elevated prostate specific antigen [PSA]: Secondary | ICD-10-CM | POA: Diagnosis not present

## 2014-11-30 DIAGNOSIS — R351 Nocturia: Secondary | ICD-10-CM | POA: Diagnosis not present

## 2014-11-30 DIAGNOSIS — N138 Other obstructive and reflux uropathy: Secondary | ICD-10-CM | POA: Diagnosis not present

## 2014-12-17 ENCOUNTER — Encounter (HOSPITAL_COMMUNITY): Payer: Self-pay | Admitting: *Deleted

## 2014-12-18 ENCOUNTER — Other Ambulatory Visit: Payer: Self-pay | Admitting: Gastroenterology

## 2014-12-23 NOTE — Anesthesia Preprocedure Evaluation (Addendum)
Anesthesia Evaluation  Patient identified by MRN, date of birth, ID band Patient awake    Reviewed: Allergy & Precautions, H&P , NPO status , Patient's Chart, lab work & pertinent test results  Airway Mallampati: II  TM Distance: >3 FB Neck ROM: full    Dental  (+) Edentulous Upper, Edentulous Lower, Dental Advisory Given   Pulmonary neg pulmonary ROS, pneumonia -, resolved, former smoker,  breath sounds clear to auscultation  Pulmonary exam normal       Cardiovascular Exercise Tolerance: Good negative cardio ROS Normal cardiovascular examRhythm:regular Rate:Normal     Neuro/Psych Numbness in hands negative neurological ROS  negative psych ROS   GI/Hepatic negative GI ROS, Neg liver ROS,   Endo/Other  negative endocrine ROS  Renal/GU negative Renal ROS  negative genitourinary   Musculoskeletal   Abdominal   Peds  Hematology negative hematology ROS (+)   Anesthesia Other Findings   Reproductive/Obstetrics negative OB ROS                            Anesthesia Physical Anesthesia Plan  ASA: II  Anesthesia Plan: MAC   Post-op Pain Management:    Induction:   Airway Management Planned:   Additional Equipment:   Intra-op Plan:   Post-operative Plan:   Informed Consent: I have reviewed the patients History and Physical, chart, labs and discussed the procedure including the risks, benefits and alternatives for the proposed anesthesia with the patient or authorized representative who has indicated his/her understanding and acceptance.   Dental Advisory Given  Plan Discussed with: CRNA and Surgeon  Anesthesia Plan Comments:         Anesthesia Quick Evaluation

## 2014-12-24 ENCOUNTER — Encounter (HOSPITAL_COMMUNITY)
Admission: RE | Disposition: A | Discharge status: Home / Self Care | Payer: Self-pay | Source: Ambulatory Visit | Attending: Gastroenterology

## 2014-12-24 ENCOUNTER — Ambulatory Visit (HOSPITAL_COMMUNITY)
Admission: RE | Admit: 2014-12-24 | Discharge: 2014-12-24 | Disposition: A | Discharge status: Home / Self Care | Payer: Commercial Managed Care - HMO | Source: Ambulatory Visit | Attending: Gastroenterology | Admitting: Gastroenterology

## 2014-12-24 ENCOUNTER — Ambulatory Visit (HOSPITAL_COMMUNITY): Payer: Commercial Managed Care - HMO | Admitting: Certified Registered Nurse Anesthetist

## 2014-12-24 ENCOUNTER — Encounter (HOSPITAL_COMMUNITY): Payer: Self-pay

## 2014-12-24 DIAGNOSIS — R700 Elevated erythrocyte sedimentation rate: Secondary | ICD-10-CM | POA: Diagnosis not present

## 2014-12-24 DIAGNOSIS — Z1211 Encounter for screening for malignant neoplasm of colon: Secondary | ICD-10-CM | POA: Insufficient documentation

## 2014-12-24 DIAGNOSIS — M47892 Other spondylosis, cervical region: Secondary | ICD-10-CM | POA: Diagnosis not present

## 2014-12-24 DIAGNOSIS — I10 Essential (primary) hypertension: Secondary | ICD-10-CM | POA: Insufficient documentation

## 2014-12-24 DIAGNOSIS — M4802 Spinal stenosis, cervical region: Secondary | ICD-10-CM | POA: Insufficient documentation

## 2014-12-24 DIAGNOSIS — N400 Enlarged prostate without lower urinary tract symptoms: Secondary | ICD-10-CM | POA: Diagnosis not present

## 2014-12-24 DIAGNOSIS — Z87891 Personal history of nicotine dependence: Secondary | ICD-10-CM | POA: Diagnosis not present

## 2014-12-24 DIAGNOSIS — M109 Gout, unspecified: Secondary | ICD-10-CM | POA: Diagnosis not present

## 2014-12-24 DIAGNOSIS — D649 Anemia, unspecified: Secondary | ICD-10-CM | POA: Insufficient documentation

## 2014-12-24 HISTORY — PX: COLONOSCOPY WITH PROPOFOL: SHX5780

## 2014-12-24 SURGERY — COLONOSCOPY WITH PROPOFOL
Anesthesia: Monitor Anesthesia Care

## 2014-12-24 MED ORDER — PROPOFOL 10 MG/ML IV BOLUS
INTRAVENOUS | Status: AC
  Filled 2014-12-24: qty 20

## 2014-12-24 MED ORDER — PROPOFOL 10 MG/ML IV BOLUS
INTRAVENOUS | Status: DC | PRN
  Administered 2014-12-24 (×2): 50 mg via INTRAVENOUS
  Administered 2014-12-24: 100 mg via INTRAVENOUS
  Administered 2014-12-24 (×3): 50 mg via INTRAVENOUS

## 2014-12-24 MED ORDER — SODIUM CHLORIDE 0.9 % IV SOLN: INTRAVENOUS | Status: DC

## 2014-12-24 MED ORDER — LACTATED RINGERS IV SOLN
INTRAVENOUS | Status: DC
  Administered 2014-12-24: 1000 mL via INTRAVENOUS

## 2014-12-24 MED ORDER — PROPOFOL INFUSION 10 MG/ML OPTIME: INTRAVENOUS | Status: DC | PRN

## 2014-12-24 MED ORDER — FENTANYL CITRATE (PF) 100 MCG/2ML IJ SOLN: 25.0000 ug | INTRAMUSCULAR | Status: DC | PRN

## 2014-12-24 MED ORDER — LIDOCAINE HCL (CARDIAC) 20 MG/ML IV SOLN
INTRAVENOUS | Status: AC
  Filled 2014-12-24: qty 5

## 2014-12-24 MED ORDER — LIDOCAINE HCL (CARDIAC) 20 MG/ML IV SOLN
INTRAVENOUS | Status: DC | PRN
  Administered 2014-12-24: 50 mg via INTRAVENOUS

## 2014-12-24 SURGICAL SUPPLY — 21 items

## 2014-12-24 NOTE — Discharge Instructions (Signed)
Colonoscopy, Care After °Refer to this sheet in the next few weeks. These instructions provide you with information on caring for yourself after your procedure. Your health care provider may also give you more specific instructions. Your treatment has been planned according to current medical practices, but problems sometimes occur. Call your health care provider if you have any problems or questions after your procedure. °WHAT TO EXPECT AFTER THE PROCEDURE  °After your procedure, it is typical to have the following: °· A small amount of blood in your stool. °· Moderate amounts of gas and mild abdominal cramping or bloating. °HOME CARE INSTRUCTIONS °· Do not drive, operate machinery, or sign important documents for 24 hours. °· You may shower and resume your regular physical activities, but move at a slower pace for the first 24 hours. °· Take frequent rest periods for the first 24 hours. °· Walk around or put a warm pack on your abdomen to help reduce abdominal cramping and bloating. °· Drink enough fluids to keep your urine clear or pale yellow. °· You may resume your normal diet as instructed by your health care provider. Avoid heavy or fried foods that are hard to digest. °· Avoid drinking alcohol for 24 hours or as instructed by your health care provider. °· Only take over-the-counter or prescription medicines as directed by your health care provider. °· If a tissue sample (biopsy) was taken during your procedure: °¨ Do not take aspirin or blood thinners for 7 days, or as instructed by your health care provider. °¨ Do not drink alcohol for 7 days, or as instructed by your health care provider. °¨ Eat soft foods for the first 24 hours. °SEEK MEDICAL CARE IF: °You have persistent spotting of blood in your stool 2-3 days after the procedure. °SEEK IMMEDIATE MEDICAL CARE IF: °· You have more than a small spotting of blood in your stool. °· You pass large blood clots in your stool. °· Your abdomen is swollen  (distended). °· You have nausea or vomiting. °· You have a fever. °· You have increasing abdominal pain that is not relieved with medicine. °Document Released: 01/28/2004 Document Revised: 04/05/2013 Document Reviewed: 02/20/2013 °ExitCare® Patient Information ©2015 [employer]. This information is not intended to replace advice given to you by your health care provider. Make sure you discuss any questions you have with your health care provider. ° °Conscious Sedation, Adult, Care After °Refer to this sheet in the next few weeks. These instructions provide you with information on caring for yourself after your procedure. Your health care provider may also give you more specific instructions. Your treatment has been planned according to current medical practices, but problems sometimes occur. Call your health care provider if you have any problems or questions after your procedure. °WHAT TO EXPECT AFTER THE PROCEDURE  °After your procedure: °· You may feel sleepy, clumsy, and have poor balance for several hours. °· Vomiting may occur if you eat too soon after the procedure. °HOME CARE INSTRUCTIONS °· Do not participate in any activities where you could become injured for at least 24 hours. Do not: °¨ Drive. °¨ Swim. °¨ Ride a bicycle. °¨ Operate heavy machinery. °¨ Cook. °¨ Use power tools. °¨ Climb ladders. °¨ Work from a high place. °· Do not make important decisions or sign legal documents until you are improved. °· If you vomit, drink water, juice, or soup when you can drink without vomiting. Make sure you have little or no nausea before eating solid foods. °·   Only take over-the-counter or prescription medicines for pain, discomfort, or fever as directed by your health care provider. °· Make sure you and your family fully understand everything about the medicines given to you, including what side effects may occur. °· You should not drink alcohol, take sleeping pills, or take medicines that cause drowsiness for  at least 24 hours. °· If you smoke, do not smoke without supervision. °· If you are feeling better, you may resume normal activities 24 hours after you were sedated. °· Keep all appointments with your health care provider. °SEEK MEDICAL CARE IF: °· Your skin is pale or bluish in color. °· You continue to feel nauseous or vomit. °· Your pain is getting worse and is not helped by medicine. °· You have bleeding or swelling. °· You are still sleepy or feeling clumsy after 24 hours. °SEEK IMMEDIATE MEDICAL CARE IF: °· You develop a rash. °· You have difficulty breathing. °· You develop any type of allergic problem. °· You have a fever. °MAKE SURE YOU: °· Understand these instructions. °· Will watch your condition. °· Will get help right away if you are not doing well or get worse. °Document Released: 04/05/2013 Document Reviewed: 04/05/2013 °ExitCare® Patient Information ©2015 [employer]. This information is not intended to replace advice given to you by your health care provider. Make sure you discuss any questions you have with your health care provider. ° ° °

## 2014-12-24 NOTE — Anesthesia Postprocedure Evaluation (Signed)
  Anesthesia Post-op Note  Patient: Steven [name]  Procedure(s) Performed: Procedure(s) (LRB): COLONOSCOPY WITH PROPOFOL (N/A)  Patient Location: PACU  Anesthesia Type: MAC  Level of Consciousness: awake and alert   Airway and Oxygen Therapy: Patient Spontanous Breathing  Post-op Pain: mild  Post-op Assessment: Post-op Vital signs reviewed, Patient's Cardiovascular Status Stable, Respiratory Function Stable, Patent Airway and No signs of Nausea or vomiting  Last Vitals:  Filed Vitals:   12/24/14 0840  BP: 138/74  Pulse: 72  Temp:   Resp: 19    Post-op Vital Signs: stable   Complications: No apparent anesthesia complications

## 2014-12-24 NOTE — H&P (Signed)
  Problem: Chronic normocytic anemia with slightly elevated sedimentation rate, normal serum iron studies, normal vitamin B 12 level, normal thyroid stimulating hormone level, and no signs of gastrointestinal bleeding by stool guaiac testing. Normal screening colonoscopy performed on 12/11/2002.  History: The patient is a 80 year old male born 10/25/1932. He was hospitalized from 11/24/2013 to 11/27/2013 to evaluate and treat community-acquired pneumonia. His hemoglobin on 11/24/2013 was 11.4 g. On 11/25/2013 his hemoglobin was 11 point grams. On 11/26/2013, his hemoglobin was 11.2 g.  The patient underwent a normal screening colonoscopy on 12/11/2002. He denies gastrointestinal bleeding, abdominal pain, or hematuria.  In May 2016, his hemoglobin was 12.7 g with normal serum ferritin, normal serum iron saturation, normal vitamin B 12 level, and negative stool Hemoccults testing and his sedimentation rate was 31 mm/h and his thyroid stimulating hormone level was normal.  Patient is scheduled to undergo screening colonoscopy today to rule out colorectal neoplasia and gastrointestinal bleeding.  Past medical history: Gout. Hypertension. Benign prostatic hypertrophy. Cervical spondylosis and cervical stenosis. Cataract surgery.  Medication allergies: None  Exam: The patient is alert and lying comfortably on the endoscopy stretcher. Abdomen is soft and nontender to palpation. Lungs are clear to auscultation. Cardiac exam reveals a regular rhythm.  Plan: Proceed with screening colonoscopy to rule out colorectal neoplasia and gastrointestinal bleeding.

## 2014-12-24 NOTE — Transfer of Care (Signed)
Immediate Anesthesia Transfer of Care Note  Patient: Steven [name] Carr  Procedure(s) Performed: Procedure(s): COLONOSCOPY WITH PROPOFOL (N/A)  Patient Location: PACU  Anesthesia Type:MAC  Level of Consciousness: awake, alert  and oriented  Airway & Oxygen Therapy: Patient Spontanous Breathing and Patient connected to face mask oxygen  Post-op Assessment: Report given to RN and Post -op Vital signs reviewed and stable  Post vital signs: Reviewed and stable  Last Vitals:  Filed Vitals:   12/24/14 0632  BP: 186/79  Pulse: 76  Temp: 36.4 C  Resp: 15    Complications: No apparent anesthesia complications

## 2014-12-24 NOTE — Op Note (Signed)
Problem: Chronic normocytic anemia. Hemoglobin 12.7 g. Normal serum ferritin. Normal serum iron saturation. Normal vitamin B-12 level. Stool Hemoccult testing negative for bleeding. Sedimentation rate 31 mm/h. Normal thyroid stimulating hormone level. Normal screening colonoscopy performed on 12/11/2002.  Endoscopist: Earle Gell  Premedication: Propofol administered by anesthesia  Procedure: Screening colonoscopy The patient was placed in the left lateral decubitus position. Anal inspection and digital rectal exam were normal. The Pentax pediatric colonoscope was introduced into the rectum and advanced to the cecum. A normal-appearing appendiceal orifice and ileocecal valve were identified. Colonic preparation for the exam today was good. Withdrawal time was 9 minutes  Rectum. Normal. Retroflexed view of the distal rectum was normal  Sigmoid colon and descending colon. Normal  Splenic flexure. Normal  Transverse colon. Normal  Hepatic flexure. Normal  Ascending colon. Normal  Cecum and ileocecal valve. Normal  Assessment: Normal screening colonoscopy.  Recommendation: Refer patient to Dr. Murriel Hopper (hematologist) to evaluate unexplained chronic normocytic anemia with elevated sedimentation rate.

## 2014-12-25 ENCOUNTER — Encounter (HOSPITAL_COMMUNITY): Payer: Self-pay | Admitting: Gastroenterology

## 2015-01-02 ENCOUNTER — Telehealth: Payer: Self-pay | Admitting: Internal Medicine

## 2015-01-02 NOTE — Telephone Encounter (Signed)
LEFT MESSAGE FOR PATIENT TO RETURN CALL  °

## 2015-01-30 ENCOUNTER — Other Ambulatory Visit: Payer: Commercial Managed Care - HMO

## 2015-01-30 ENCOUNTER — Ambulatory Visit: Payer: Commercial Managed Care - HMO

## 2015-01-30 ENCOUNTER — Ambulatory Visit: Payer: Commercial Managed Care - HMO | Admitting: Internal Medicine

## 2015-02-01 ENCOUNTER — Other Ambulatory Visit: Payer: Self-pay | Admitting: Medical Oncology

## 2015-02-04 ENCOUNTER — Ambulatory Visit (HOSPITAL_BASED_OUTPATIENT_CLINIC_OR_DEPARTMENT_OTHER): Payer: Commercial Managed Care - HMO

## 2015-02-04 ENCOUNTER — Ambulatory Visit (HOSPITAL_BASED_OUTPATIENT_CLINIC_OR_DEPARTMENT_OTHER): Payer: Commercial Managed Care - HMO | Admitting: Internal Medicine

## 2015-02-04 ENCOUNTER — Telehealth: Payer: Self-pay | Admitting: Internal Medicine

## 2015-02-04 ENCOUNTER — Ambulatory Visit: Payer: Commercial Managed Care - HMO

## 2015-02-04 ENCOUNTER — Encounter: Payer: Self-pay | Admitting: Internal Medicine

## 2015-02-04 ENCOUNTER — Other Ambulatory Visit: Payer: Self-pay | Admitting: Medical Oncology

## 2015-02-04 ENCOUNTER — Other Ambulatory Visit (HOSPITAL_BASED_OUTPATIENT_CLINIC_OR_DEPARTMENT_OTHER): Payer: Commercial Managed Care - HMO

## 2015-02-04 ENCOUNTER — Other Ambulatory Visit: Payer: Self-pay | Admitting: Internal Medicine

## 2015-02-04 VITALS — BP 163/60 | HR 76 | Temp 98.9°F | Resp 18 | Ht 72.0 in | Wt 215.9 lb

## 2015-02-04 DIAGNOSIS — D649 Anemia, unspecified: Secondary | ICD-10-CM

## 2015-02-04 DIAGNOSIS — D539 Nutritional anemia, unspecified: Secondary | ICD-10-CM

## 2015-02-04 DIAGNOSIS — Z87891 Personal history of nicotine dependence: Secondary | ICD-10-CM

## 2015-02-04 HISTORY — DX: Nutritional anemia, unspecified: D53.9

## 2015-02-04 LAB — COMPREHENSIVE METABOLIC PANEL (CC13)
ALT: 12 U/L (ref 0–55)
ANION GAP: 7 meq/L (ref 3–11)
AST: 17 U/L (ref 5–34)
Albumin: 3.9 g/dL (ref 3.5–5.0)
Alkaline Phosphatase: 48 U/L (ref 40–150)
BUN: 18.7 mg/dL (ref 7.0–26.0)
CALCIUM: 9.4 mg/dL (ref 8.4–10.4)
CHLORIDE: 108 meq/L (ref 98–109)
CO2: 27 mEq/L (ref 22–29)
Creatinine: 1.1 mg/dL (ref 0.7–1.3)
EGFR: 72 mL/min/{1.73_m2} — ABNORMAL LOW (ref >90)
GLUCOSE: 91 mg/dL (ref 70–140)
Potassium: 4.2 mEq/L (ref 3.5–5.1)
SODIUM: 142 meq/L (ref 136–145)
TOTAL PROTEIN: 7.4 g/dL (ref 6.4–8.3)
Total Bilirubin: 0.4 mg/dL (ref 0.20–1.20)

## 2015-02-04 LAB — CBC WITH DIFFERENTIAL/PLATELET
BASO%: 0.6 % (ref 0.0–2.0)
Basophils Absolute: 0 10*3/uL (ref 0.0–0.1)
EOS ABS: 0.1 10*3/uL (ref 0.0–0.5)
EOS%: 2.7 % (ref 0.0–7.0)
HCT: 36.7 % — ABNORMAL LOW (ref 38.4–49.9)
HEMOGLOBIN: 12.3 g/dL — AB (ref 13.0–17.1)
LYMPH%: 41.5 % (ref 14.0–49.0)
MCH: 29.1 pg (ref 27.2–33.4)
MCHC: 33.5 g/dL (ref 32.0–36.0)
MCV: 86.7 fL (ref 79.3–98.0)
MONO#: 0.9 10*3/uL (ref 0.1–0.9)
MONO%: 21.5 % — ABNORMAL HIGH (ref 0.0–14.0)
NEUT#: 1.4 10*3/uL — ABNORMAL LOW (ref 1.5–6.5)
NEUT%: 33.7 % — ABNORMAL LOW (ref 39.0–75.0)
Platelets: 188 10*3/uL (ref 140–400)
RBC: 4.23 10*6/uL (ref 4.20–5.82)
RDW: 14.4 % (ref 11.0–14.6)
WBC: 4 10*3/uL (ref 4.0–10.3)
lymph#: 1.7 10*3/uL (ref 0.9–3.3)

## 2015-02-04 LAB — LACTATE DEHYDROGENASE (CC13): LDH: 160 U/L (ref 125–245)

## 2015-02-04 LAB — FERRITIN CHCC: Ferritin: 199 ng/ml (ref 22–316)

## 2015-02-04 LAB — IRON AND TIBC CHCC
%SAT: 20 % (ref 20–55)
IRON: 49 ug/dL (ref 42–163)
TIBC: 239 ug/dL (ref 202–409)
UIBC: 190 ug/dL (ref 117–376)

## 2015-02-04 NOTE — Telephone Encounter (Signed)
Gave and pritned appt sched and avs for pt for Sept °

## 2015-02-04 NOTE — Progress Notes (Signed)
[facility] [phone]   [phone]  CONSULT NOTE  REFERRING PHYSICIAN: Dr. Earle Gell  REASON FOR CONSULTATION:  80 years old African-American male with anemia  HPI Steven [name] is a 80 y.o. male with past medical history significant for hypertension, benign prostatic hypertrophy, aortic ejection murmur, gout as well as history of hydronephrosis. The patient was seen by his primary care physician recently and was found on routine blood work to have mild anemia. CBC on 10/02/2014 showed hemoglobin of 12.9 and hematocrit 38.7%. Repeat CBC on 10/10/2014 showed hemoglobin 12.7 and hematocrit 38.3%. The patient was referred to Dr. Wynetta Emery and repeat colonoscopy showed no concerning findings. He was referred to me today for further evaluation and recommendation regarding his persistent anemia. The patient denied having any bleeding issues. He specifically denied having any rectal bleeding, no nose or gum bleed. He denied having any significant fatigue or dizzy spells. He has some mild shortness breath with exertion. No significant weight loss or night sweats. He does not take any nutritional supplements. Family history significant for father with osteoarthritis and glaucoma and mother with diabetes mellitus. The patient is a widow and has 7 children. He used to work at [employer]. Has remote history of smoking but quit 50 years ago and no history of alcohol or drug abuse.  HPI  Past Medical History  Diagnosis Date  . Cataract     left eye  . Numbness     both hands pt states pinched. 12-17-14 Gabapentin has improved.  . Carpal tunnel syndrome   . History of gout   . Psoriasis   . Enlarged prostate   . History of kidney stones     pt has one now but not giving him any problems    Past Surgical History  Procedure Laterality Date  . Tonsillectomy      as child  . Right cataract removed     . Colonoscopy    . Cataract  extraction w/phaco Left 03/15/2013    Procedure: CATARACT EXTRACTION PHACO AND INTRAOCULAR LENS PLACEMENT (IOC);  Surgeon: Adonis Brook, MD;  Location: [facility];  Service: Ophthalmology;  Laterality: Left;  . Colonoscopy with propofol N/A 12/24/2014    Procedure: COLONOSCOPY WITH PROPOFOL;  Surgeon: Garlan Fair, MD;  Location: [facility];  Service: Endoscopy;  Laterality: N/A;    No family history on file.  Social History History  Substance Use Topics  . Smoking status: Former Smoker    Types: Cigars  . Smokeless tobacco: Not on file     Comment: smoked cigars 104yr ago  . Alcohol Use: No    No Known Allergies  Current Outpatient Prescriptions  Medication Sig Dispense Refill  . gabapentin (NEURONTIN) 300 MG capsule Take 300 mg by mouth 3 (three) times daily.     No current facility-administered medications for this visit.    Review of Systems  Constitutional: negative Eyes: negative Ears, nose, mouth, throat, and face: negative Respiratory: positive for dyspnea on exertion Cardiovascular: negative Gastrointestinal: negative Genitourinary:negative Integument/breast: negative Hematologic/lymphatic: negative Musculoskeletal:negative Neurological: negative Behavioral/Psych: negative Endocrine: negative Allergic/Immunologic: negative  Physical Exam  REYE:MVVKP healthy, no distress, well nourished and well developed SKIN: skin color, texture, turgor are normal, no rashes or significant lesions HEAD: Normocephalic, No masses, lesions, tenderness or abnormalities EYES: normal, PERRLA, Conjunctiva are pink and non-injected EARS: External ears normal, Canals clear OROPHARYNX:no exudate, no erythema and lips, buccal mucosa, and tongue normal  NECK: supple, no  adenopathy, no JVD LYMPH:  no palpable lymphadenopathy, no hepatosplenomegaly LUNGS: clear to auscultation , and palpation HEART: 2/6,  aortic area  ABDOMEN:abdomen soft, non-tender, normal bowel sounds and no  masses or organomegaly BACK: Back symmetric, no curvature., No CVA tenderness EXTREMITIES:no joint deformities, effusion, or inflammation, no edema, no skin discoloration  NEURO: alert & oriented x 3 with fluent speech, no focal motor/sensory deficits  PERFORMANCE STATUS: ECOG 1  LABORATORY DATA: Lab Results  Component Value Date   WBC 4.0 02/04/2015   HGB 12.3* 02/04/2015   HCT 36.7* 02/04/2015   MCV 86.7 02/04/2015   PLT 188 02/04/2015      Chemistry      Component Value Date/Time   NA 142 02/04/2015 1107   NA 136* 11/26/2013 0520   K 4.2 02/04/2015 1107   K 4.1 11/26/2013 0520   CL 99 11/26/2013 0520   CO2 27 02/04/2015 1107   CO2 24 11/26/2013 0520   BUN 18.7 02/04/2015 1107   BUN 21 11/26/2013 0520   CREATININE 1.1 02/04/2015 1107   CREATININE 1.20 11/26/2013 0520      Component Value Date/Time   CALCIUM 9.4 02/04/2015 1107   CALCIUM 9.0 11/26/2013 0520   ALKPHOS 48 02/04/2015 1107   ALKPHOS 51 11/25/2013 0526   AST 17 02/04/2015 1107   AST 20 11/25/2013 0526   ALT 12 02/04/2015 1107   ALT 11 11/25/2013 0526   BILITOT 0.40 02/04/2015 1107   BILITOT 0.5 11/25/2013 0526       RADIOGRAPHIC STUDIES: No results found.  ASSESSMENT: This is a very pleasant 80 years old African-American male with persistent mild anemia of unclear etiology but it could be anemia of chronic disease versus mild deficiency anemia. His recent colonoscopy was unremarkable according to the patient.   PLAN: I had a lengthy discussion with the patient about his condition. I will order several studies for reevaluation of his anemia including repeat CBC, comprehensive metabolic panel, LDH, serum erythropoietin, serum protein electrophoreses, iron study, ferritin, serum folate as well as vitamin B-12 level. If no clear etiology for his anemia and no improvement, I would consider the patient for bone marrow biopsy and aspirate to rule out any other underlying abnormality was his bone marrow. I  also empirically started the patient on Integra plus 1 capsule by mouth daily I will see the patient back for follow-up visit in one month for reevaluation with repeat CBC. The patient was advised to call immediately if he has any concerning symptoms in the interval. The patient voices understanding of current disease status and treatment options and is in agreement with the current care plan.  All questions were answered. The patient knows to call the clinic with any problems, questions or concerns. We can certainly see the patient much sooner if necessary.  Thank you so much for allowing me to participate in the care of Steven [name]. I will continue to follow up the patient with you and assist in his care.  I spent 30 minutes counseling the patient face to face. The total time spent in the appointment was 55 minutes.  Disclaimer: This note was dictated with voice recognition software. Similar sounding words can inadvertently be transcribed and may not be corrected upon review.   Perian Tedder K. February 04, 2015, 11:55 AM

## 2015-02-04 NOTE — Progress Notes (Signed)
Checked in new pt with no financial concerns. °

## 2015-02-07 ENCOUNTER — Other Ambulatory Visit: Payer: Self-pay | Admitting: Internal Medicine

## 2015-02-07 ENCOUNTER — Telehealth: Payer: Self-pay | Admitting: *Deleted

## 2015-02-07 DIAGNOSIS — D539 Nutritional anemia, unspecified: Secondary | ICD-10-CM

## 2015-02-07 LAB — PROTEIN ELECTROPHORESIS, SERUM, WITH REFLEX
Albumin ELP: 3.8 g/dL (ref 3.8–4.8)
Alpha-1-Globulin: 0.3 g/dL (ref 0.2–0.3)
Alpha-2-Globulin: 0.9 g/dL (ref 0.5–0.9)
BETA 2: 0.4 g/dL (ref 0.2–0.5)
BETA GLOBULIN: 0.4 g/dL (ref 0.4–0.6)
GAMMA GLOBULIN: 1.3 g/dL (ref 0.8–1.7)
Total Protein, Serum Electrophoresis: 7.1 g/dL (ref 6.1–8.1)

## 2015-02-07 LAB — HEAVY METALS, BLOOD: Lead: <2 ug/dL (ref <10)

## 2015-02-07 LAB — TRANSFERRIN RECEPTOR, SOLUABLE: Transferrin Receptor, Soluble: 1.55 mg/L (ref 0.76–1.76)

## 2015-02-07 LAB — ERYTHROPOIETIN: Erythropoietin: 12.5 m[IU]/mL (ref 2.6–18.5)

## 2015-02-07 LAB — FOLATE: FOLATE: 17.4 ng/mL

## 2015-02-07 LAB — VITAMIN B12: VITAMIN B 12: 450 pg/mL (ref 211–911)

## 2015-02-07 MED ORDER — INTEGRA PLUS PO CAPS: 1.0000 | ORAL_CAPSULE | Freq: Every morning | ORAL | Status: DC

## 2015-02-07 NOTE — Telephone Encounter (Signed)
I sent a RX to his pharmacy.

## 2015-02-07 NOTE — Telephone Encounter (Signed)
Received TC from patient regarding a prescription that was to be called in to his pharmacy for his iron deficiency. Pt states that there is not a prescription there. Per Dr. Worthy Flank note, he is to start on Integra Plus. Notified Stanton Kidney, Therapist, sports.

## 2015-03-11 ENCOUNTER — Ambulatory Visit (HOSPITAL_BASED_OUTPATIENT_CLINIC_OR_DEPARTMENT_OTHER): Payer: Commercial Managed Care - HMO | Admitting: Internal Medicine

## 2015-03-11 ENCOUNTER — Telehealth: Payer: Self-pay | Admitting: Internal Medicine

## 2015-03-11 ENCOUNTER — Encounter: Payer: Self-pay | Admitting: Internal Medicine

## 2015-03-11 ENCOUNTER — Other Ambulatory Visit (HOSPITAL_BASED_OUTPATIENT_CLINIC_OR_DEPARTMENT_OTHER): Payer: Commercial Managed Care - HMO

## 2015-03-11 VITALS — BP 139/68 | HR 100 | Temp 98.9°F | Resp 18 | Ht 72.0 in | Wt 213.9 lb

## 2015-03-11 DIAGNOSIS — D72819 Decreased white blood cell count, unspecified: Secondary | ICD-10-CM | POA: Diagnosis not present

## 2015-03-11 DIAGNOSIS — D649 Anemia, unspecified: Secondary | ICD-10-CM

## 2015-03-11 DIAGNOSIS — D539 Nutritional anemia, unspecified: Secondary | ICD-10-CM

## 2015-03-11 HISTORY — DX: Decreased white blood cell count, unspecified: D72.819

## 2015-03-11 LAB — CBC WITH DIFFERENTIAL/PLATELET
BASO%: 0.6 % (ref 0.0–2.0)
Basophils Absolute: 0 10*3/uL (ref 0.0–0.1)
EOS%: 3 % (ref 0.0–7.0)
Eosinophils Absolute: 0.1 10*3/uL (ref 0.0–0.5)
HEMATOCRIT: 37.2 % — AB (ref 38.4–49.9)
HGB: 12.6 g/dL — ABNORMAL LOW (ref 13.0–17.1)
LYMPH%: 44.2 % (ref 14.0–49.0)
MCH: 29 pg (ref 27.2–33.4)
MCHC: 33.9 g/dL (ref 32.0–36.0)
MCV: 85.5 fL (ref 79.3–98.0)
MONO#: 0.5 10*3/uL (ref 0.1–0.9)
MONO%: 14.2 % — ABNORMAL HIGH (ref 0.0–14.0)
NEUT%: 38 % — AB (ref 39.0–75.0)
NEUTROS ABS: 1.3 10*3/uL — AB (ref 1.5–6.5)
Platelets: 190 10*3/uL (ref 140–400)
RBC: 4.35 10*6/uL (ref 4.20–5.82)
RDW: 14.4 % (ref 11.0–14.6)
WBC: 3.3 10*3/uL — AB (ref 4.0–10.3)
lymph#: 1.5 10*3/uL (ref 0.9–3.3)

## 2015-03-11 NOTE — Telephone Encounter (Signed)
Gave adn printed appt sched and avs for pt for OCT °

## 2015-03-11 NOTE — Progress Notes (Signed)
[facility] [phone]   Fax:([phone]) [place], [place] [address]  DIAGNOSIS:  1) persistent anemia of unknown etiology questionable for anemia of chronic disease. 2) leukocytopenia  PRIOR THERAPY: Oral iron tablets with Integra plus 1 capsule by mouth daily  CURRENT THERAPY: None  INTERVAL HISTORY: Steven [name] 80 y.o. male returns to the clinic today for follow-up visit. The patient has been doing fine with no specific complaints. He denied having any significant fatigue weakness. He denied having any dizzy spells. He has been removed oral iron tablets with Integra plus daily for the last few weeks with no significant change in his condition. He denied having any significant chest pain, shortness breath, cough or hemoptysis. He has no nausea or vomiting. The patient had several studies for evaluation of his anemia including repeat iron study, ferritin, serum folate, serum vitamin B 12 level, serum erythropoietin as well as serum protein electrophoresis that came unremarkable. He is here today for evaluation and discussion of his lab results and further recommendation regarding his condition.  MEDICAL HISTORY: Past Medical History  Diagnosis Date  . Cataract     left eye  . Numbness     both hands pt states pinched. 12-17-14 Gabapentin has improved.  . Carpal tunnel syndrome   . History of gout   . Psoriasis   . Enlarged prostate   . History of kidney stones     pt has one now but not giving him any problems    ALLERGIES:  has No Known Allergies.  MEDICATIONS:  Current Outpatient Prescriptions  Medication Sig Dispense Refill  . gabapentin (NEURONTIN) 300 MG capsule Take 300 mg by mouth 3 (three) times daily.    Marland Kitchen FeFum-FePoly-FA-B Cmp-C-Biot (INTEGRA PLUS) CAPS Take 1 capsule by mouth every morning. (Patient not taking: Reported on 03/11/2015) 30 capsule 1   No current  facility-administered medications for this visit.    SURGICAL HISTORY:  Past Surgical History  Procedure Laterality Date  . Tonsillectomy      as child  . Right cataract removed     . Colonoscopy    . Cataract extraction w/phaco Left 03/15/2013    Procedure: CATARACT EXTRACTION PHACO AND INTRAOCULAR LENS PLACEMENT (IOC);  Surgeon: Adonis Brook, MD;  Location: [facility];  Service: Ophthalmology;  Laterality: Left;  . Colonoscopy with propofol N/A 12/24/2014    Procedure: COLONOSCOPY WITH PROPOFOL;  Surgeon: Garlan Fair, MD;  Location: [facility];  Service: Endoscopy;  Laterality: N/A;    REVIEW OF SYSTEMS:  A comprehensive review of systems was negative.   PHYSICAL EXAMINATION: General appearance: alert, cooperative and no distress Head: Normocephalic, without obvious abnormality, atraumatic Neck: no adenopathy, no JVD, supple, symmetrical, trachea midline and thyroid not enlarged, symmetric, no tenderness/mass/nodules Lymph nodes: Cervical, supraclavicular, and axillary nodes normal. Resp: clear to auscultation bilaterally Back: symmetric, no curvature. ROM normal. No CVA tenderness. Cardio: regular rate and rhythm, S1, S2 normal, no murmur, click, rub or gallop GI: soft, non-tender; bowel sounds normal; no masses,  no organomegaly Extremities: extremities normal, atraumatic, no cyanosis or edema  ECOG PERFORMANCE STATUS: 0 - Asymptomatic  Blood pressure 139/68, pulse 100, temperature 98.9 F (37.2 C), temperature source Oral, resp. rate 18, height 6' (1.829 m), weight 213 lb 14.4 oz (97.024 kg), SpO2 100 %.  LABORATORY DATA: Lab Results  Component Value Date   WBC 3.3* 03/11/2015   HGB 12.6*  03/11/2015   HCT 37.2* 03/11/2015   MCV 85.5 03/11/2015   PLT 190 03/11/2015      Chemistry      Component Value Date/Time   NA 142 02/04/2015 1107   NA 136* 11/26/2013 0520   K 4.2 02/04/2015 1107   K 4.1 11/26/2013 0520   CL 99 11/26/2013 0520   CO2 27 02/04/2015 1107   CO2  24 11/26/2013 0520   BUN 18.7 02/04/2015 1107   BUN 21 11/26/2013 0520   CREATININE 1.1 02/04/2015 1107   CREATININE 1.20 11/26/2013 0520      Component Value Date/Time   CALCIUM 9.4 02/04/2015 1107   CALCIUM 9.0 11/26/2013 0520   ALKPHOS 48 02/04/2015 1107   ALKPHOS 51 11/25/2013 0526   AST 17 02/04/2015 1107   AST 20 11/25/2013 0526   ALT 12 02/04/2015 1107   ALT 11 11/25/2013 0526   BILITOT 0.40 02/04/2015 1107   BILITOT 0.5 11/25/2013 0526       RADIOGRAPHIC STUDIES: No results found.  ASSESSMENT AND PLAN: This is a very pleasant 79 years old African-American male with persistent anemia for the questionable for anemia of chronic disease and the patient also developed leukocytopenia. His previous bloodwork is unremarkable for any significant abnormalities. I discussed the lab result with the patient today. I recommended for him to consider a CT-guided bone marrow biopsy and aspirate to rule out any underlying bone marrow abnormality especially with the development of leukocytopenia. I will arrange for his biopsy to be performed in the next 1-2 weeks and the patient would come back for follow-up visit in 3 weeks for reevaluation and discussion of his biopsy results and recommendation regarding his condition. He was advised to call immediately if he has any concerning symptoms in the interval. The patient voices understanding of current disease status and treatment options and is in agreement with the current care plan.  All questions were answered. The patient knows to call the clinic with any problems, questions or concerns. We can certainly see the patient much sooner if necessary.  Disclaimer: This note was dictated with voice recognition software. Similar sounding words can inadvertently be transcribed and may not be corrected upon review.

## 2015-03-20 ENCOUNTER — Other Ambulatory Visit: Payer: Self-pay | Admitting: Radiology

## 2015-03-21 ENCOUNTER — Other Ambulatory Visit: Payer: Self-pay

## 2015-03-21 ENCOUNTER — Ambulatory Visit (HOSPITAL_COMMUNITY)
Admission: RE | Admit: 2015-03-21 | Discharge: 2015-03-21 | Disposition: A | Discharge status: Home / Self Care | Payer: Commercial Managed Care - HMO | Source: Ambulatory Visit | Attending: Internal Medicine | Admitting: Internal Medicine

## 2015-03-21 ENCOUNTER — Encounter (HOSPITAL_COMMUNITY): Payer: Self-pay

## 2015-03-21 DIAGNOSIS — D649 Anemia, unspecified: Secondary | ICD-10-CM | POA: Diagnosis not present

## 2015-03-21 DIAGNOSIS — R200 Anesthesia of skin: Secondary | ICD-10-CM | POA: Insufficient documentation

## 2015-03-21 DIAGNOSIS — I4892 Unspecified atrial flutter: Secondary | ICD-10-CM | POA: Insufficient documentation

## 2015-03-21 DIAGNOSIS — Z87891 Personal history of nicotine dependence: Secondary | ICD-10-CM | POA: Insufficient documentation

## 2015-03-21 DIAGNOSIS — L409 Psoriasis, unspecified: Secondary | ICD-10-CM | POA: Insufficient documentation

## 2015-03-21 DIAGNOSIS — Z79899 Other long term (current) drug therapy: Secondary | ICD-10-CM | POA: Insufficient documentation

## 2015-03-21 DIAGNOSIS — D7589 Other specified diseases of blood and blood-forming organs: Secondary | ICD-10-CM | POA: Diagnosis not present

## 2015-03-21 DIAGNOSIS — D72819 Decreased white blood cell count, unspecified: Secondary | ICD-10-CM | POA: Diagnosis not present

## 2015-03-21 DIAGNOSIS — D539 Nutritional anemia, unspecified: Secondary | ICD-10-CM | POA: Diagnosis not present

## 2015-03-21 LAB — PROTIME-INR
INR: 1.06 (ref 0.00–1.49)
PROTHROMBIN TIME: 14 s (ref 11.6–15.2)

## 2015-03-21 LAB — CBC
HCT: 39.2 % (ref 39.0–52.0)
HEMOGLOBIN: 13.1 g/dL (ref 13.0–17.0)
MCH: 29 pg (ref 26.0–34.0)
MCHC: 33.4 g/dL (ref 30.0–36.0)
MCV: 86.7 fL (ref 78.0–100.0)
Platelets: 193 10*3/uL (ref 150–400)
RBC: 4.52 MIL/uL (ref 4.22–5.81)
RDW: 14.5 % (ref 11.5–15.5)
WBC: 4 10*3/uL (ref 4.0–10.5)

## 2015-03-21 LAB — BONE MARROW EXAM

## 2015-03-21 LAB — APTT: APTT: 31 s (ref 24–37)

## 2015-03-21 MED ORDER — FENTANYL CITRATE (PF) 100 MCG/2ML IJ SOLN
INTRAMUSCULAR | Status: AC | PRN
  Administered 2015-03-21: 50 ug via INTRAVENOUS

## 2015-03-21 MED ORDER — HYDROCODONE-ACETAMINOPHEN 5-325 MG PO TABS
1.0000 | ORAL_TABLET | ORAL | Status: DC | PRN
  Filled 2015-03-21: qty 2

## 2015-03-21 MED ORDER — MIDAZOLAM HCL 2 MG/2ML IJ SOLN
INTRAMUSCULAR | Status: AC
  Filled 2015-03-21: qty 6

## 2015-03-21 MED ORDER — MIDAZOLAM HCL 2 MG/2ML IJ SOLN
INTRAMUSCULAR | Status: AC | PRN
  Administered 2015-03-21 (×3): 1 mg via INTRAVENOUS

## 2015-03-21 MED ORDER — FENTANYL CITRATE (PF) 100 MCG/2ML IJ SOLN
INTRAMUSCULAR | Status: AC
  Filled 2015-03-21: qty 4

## 2015-03-21 MED ORDER — SODIUM CHLORIDE 0.9 % IV SOLN
INTRAVENOUS | Status: DC
  Administered 2015-03-21: 07:00:00 via INTRAVENOUS

## 2015-03-21 NOTE — Procedures (Signed)
CT guided bone marrow biopsy.  2 aspirates and 1 core biopsy.  No immediate complication.  Minimal blood loss.

## 2015-03-21 NOTE — Discharge Instructions (Signed)
Bone Marrow Aspiration, Bone Marrow Biopsy °Care After °Read the instructions outlined below and refer to this sheet in the next few weeks. These discharge instructions provide you with general information on caring for yourself after you leave the hospital. Your caregiver may also give you specific instructions. While your treatment has been planned according to the most current medical practices available, unavoidable complications occasionally occur. If you have any problems or questions after discharge, call your caregiver. °FINDING OUT THE RESULTS OF YOUR TEST °Not all test results are available during your visit. If your test results are not back during the visit, make an appointment with your caregiver to find out the results. Do not assume everything is normal if you have not heard from your caregiver or the medical facility. It is important for you to follow up on all of your test results.  °HOME CARE INSTRUCTIONS  °You have had sedation and may be sleepy or dizzy. Your thinking may not be as clear as usual. For the next 24 hours: °· Only take over-the-counter or prescription medicines for pain, discomfort, and or fever as directed by your caregiver. °· Do not drink alcohol. °· Do not smoke. °· Do not drive. °· Do not make important legal decisions. °· Do not operate heavy machinery. °· Do not care for small children by yourself. °· Keep your dressing clean and dry. You may replace dressing with a bandage after 24 hours. °· You may take a bath or shower after 24 hours. °· Use an ice pack for 20 minutes every 2 hours while awake for pain as needed. °SEEK MEDICAL CARE IF:  °· There is redness, swelling, or increasing pain at the biopsy site. °· There is pus coming from the biopsy site. °· There is drainage from a biopsy site lasting longer than one day. °· An unexplained oral temperature above 102° F (38.9° C) develops. °SEEK IMMEDIATE MEDICAL CARE IF:  °· You develop a rash. °· You have difficulty  breathing. °· You develop any reaction or side effects to medications given. °Document Released: 01/02/2005 Document Revised: 09/07/2011 Document Reviewed: 06/12/2008 °[employer]® Patient Information ©2015 [employer]. This information is not intended to replace advice given to you by your health care provider. Make sure you discuss any questions you have with your health care provider. °Conscious Sedation °Sedation is the use of medicines to promote relaxation and relieve discomfort and anxiety. Conscious sedation is a type of sedation. Under conscious sedation you are less alert than normal but are still able to respond to instructions or stimulation. Conscious sedation is used during short medical and dental procedures. It is milder than deep sedation or general anesthesia and allows you to return to your regular activities sooner.  °LET YOUR HEALTH CARE PROVIDER KNOW ABOUT:  °· Any allergies you have. °· All medicines you are taking, including vitamins, herbs, eye drops, creams, and over-the-counter medicines. °· Use of steroids (by mouth or creams). °· Previous problems you or members of your family have had with the use of anesthetics. °· Any blood disorders you have. °· Previous surgeries you have had. °· Medical conditions you have. °· Possibility of pregnancy, if this applies. °· Use of cigarettes, alcohol, or illegal drugs. °RISKS AND COMPLICATIONS °Generally, this is a safe procedure. However, as with any procedure, problems can occur. Possible problems include: °· Oversedation. °· Trouble breathing on your own. You may need to have a breathing tube until you are awake and breathing on your own. °· Allergic reaction   to any of the medicines used for the procedure. BEFORE THE PROCEDURE  You may have blood tests done. These tests can help show how well your kidneys and liver are working. They can also show how well your blood clots.  A physical exam will be done.  Only take medicines as directed by  your health care provider. You may need to stop taking medicines (such as blood thinners, aspirin, or nonsteroidal anti-inflammatory drugs) before the procedure.   Do not eat or drink at least 6 hours before the procedure or as directed by your health care provider.  Arrange for a responsible adult, family member, or friend to take you home after the procedure. He or she should stay with you for at least 24 hours after the procedure, until the medicine has worn off. PROCEDURE   An intravenous (IV) catheter will be inserted into one of your veins. Medicine will be able to flow directly into your body through this catheter. You may be given medicine through this tube to help prevent pain and help you relax.  The medical or dental procedure will be done. AFTER THE PROCEDURE  You will stay in a recovery area until the medicine has worn off. Your blood pressure and pulse will be checked.   Depending on the procedure you had, you may be allowed to go home when you can tolerate liquids and your pain is under control. Document Released: 03/10/2001 Document Revised: 06/20/2013 Document Reviewed: 02/20/2013 [facility] Patient Information 2015 [place], [place]. This information is not intended to replace advice given to you by your health care provider. Make sure you discuss any questions you have with your health care provider. Conscious Sedation, Adult, Care After Refer to this sheet in the next few weeks. These instructions provide you with information on caring for yourself after your procedure. Your health care provider may also give you more specific instructions. Your treatment has been planned according to current medical practices, but problems sometimes occur. Call your health care provider if you have any problems or questions after your procedure. WHAT TO EXPECT AFTER THE PROCEDURE  After your procedure:  You may feel sleepy, clumsy, and have poor balance for several hours.  Vomiting may  occur if you eat too soon after the procedure. HOME CARE INSTRUCTIONS  Do not participate in any activities where you could become injured for at least 24 hours. Do not:  Drive.  Swim.  Ride a bicycle.  Operate heavy machinery.  Cook.  Use power tools.  Climb ladders.  Work from a high place.  Do not make important decisions or sign legal documents until you are improved.  If you vomit, drink water, juice, or soup when you can drink without vomiting. Make sure you have little or no nausea before eating solid foods.  Only take over-the-counter or prescription medicines for pain, discomfort, or fever as directed by your health care provider.  Make sure you and your family fully understand everything about the medicines given to you, including what side effects may occur.  You should not drink alcohol, take sleeping pills, or take medicines that cause drowsiness for at least 24 hours.  If you smoke, do not smoke without supervision.  If you are feeling better, you may resume normal activities 24 hours after you were sedated.  Keep all appointments with your health care provider. SEEK MEDICAL CARE IF:  Your skin is pale or bluish in color.  You continue to feel nauseous or vomit.  Your pain  is getting worse and is not helped by medicine.  You have bleeding or swelling.  You are still sleepy or feeling clumsy after 24 hours. SEEK IMMEDIATE MEDICAL CARE IF:  You develop a rash.  You have difficulty breathing.  You develop any type of allergic problem.  You have a fever. MAKE SURE YOU:  Understand these instructions.  Will watch your condition.  Will get help right away if you are not doing well or get worse. Document Released: 04/05/2013 Document Reviewed: 04/05/2013 [facility] Patient Information 2015 [place], [place]. This information is not intended to replace advice given to you by your health care provider. Make sure you discuss any questions you have with  your health care provider.

## 2015-03-21 NOTE — Progress Notes (Signed)
Patient ID: Steven [name], male   DOB: 1934/07/22, 80 y.o.   MRN: [id number] Both Dr. Lew Dawes office and Dr. Thurman Coyer office made aware of patient's recent EKG.

## 2015-03-21 NOTE — H&P (Signed)
Chief Complaint: Patient was seen in consultation today for  CT guided bone marrow biopsy  Referring Physician(s): Mohamed,Mohamed  History of Present Illness: Steven [name] is a 80 y.o. male with history of persistent anemia of unknown etiology as well as leukocytopenia who presents today for CT guided bone marrow biopsy for further evaluation.   Past Medical History  Diagnosis Date  . Cataract     left eye  . Numbness     both hands pt states pinched. 12-17-14 Gabapentin has improved.  . Carpal tunnel syndrome   . History of gout   . Psoriasis   . Enlarged prostate   . History of kidney stones     pt has one now but not giving him any problems    Past Surgical History  Procedure Laterality Date  . Tonsillectomy      as child  . Right cataract removed     . Colonoscopy    . Cataract extraction w/phaco Left 03/15/2013    Procedure: CATARACT EXTRACTION PHACO AND INTRAOCULAR LENS PLACEMENT (IOC);  Surgeon: Adonis Brook, MD;  Location: [facility];  Service: Ophthalmology;  Laterality: Left;  . Colonoscopy with propofol N/A 12/24/2014    Procedure: COLONOSCOPY WITH PROPOFOL;  Surgeon: Garlan Fair, MD;  Location: [facility];  Service: Endoscopy;  Laterality: N/A;    Allergies: Review of patient's allergies indicates no known allergies.  Medications: Prior to Admission medications   Medication Sig Start Date End Date Taking? Authorizing Provider  gabapentin (NEURONTIN) 300 MG capsule Take 300 mg by mouth 3 (three) times daily as needed.    Yes Historical Provider, MD  FeFum-FePoly-FA-B Cmp-C-Biot (INTEGRA PLUS) CAPS Take 1 capsule by mouth every morning. Patient not taking: Reported on 03/11/2015 02/07/15   Curt Bears, MD     History reviewed. No pertinent family history.  Social History   Social History  . Marital Status: Widowed    Spouse Name: N/A  . Number of Children: N/A  . Years of Education: N/A   Social History Main Topics  . Smoking status:  Former Smoker    Types: Cigars  . Smokeless tobacco: None     Comment: smoked cigars 40yr ago  . Alcohol Use: No  . Drug Use: No  . Sexual Activity: No   Other Topics Concern  . None   Social History Narrative     Review of Systems   Constitutional: Negative for fever and chills.  Respiratory: Negative for cough and shortness of breath.   Cardiovascular: Negative for chest pain.  Gastrointestinal: Negative for nausea, vomiting, abdominal pain and blood in stool.  Genitourinary: Negative for dysuria and hematuria.  Musculoskeletal: Negative for back pain.  Neurological: Negative for headaches.    Vital Signs: BP 138/90 mmHg  Pulse 94  Temp(Src) 98.2 F (36.8 C) (Oral)  Resp 18  SpO2 100%  Physical Exam  Constitutional: He is oriented to person, place, and time. He appears well-developed and well-nourished.  Cardiovascular: Normal rate.   Irregular rhythm  Pulmonary/Chest: Effort normal and breath sounds normal.  Abdominal: Soft. Bowel sounds are normal. There is no tenderness.  Musculoskeletal: Normal range of motion. He exhibits no edema.  Neurological: He is alert and oriented to person, place, and time.    Mallampati Score:     Imaging: No results found.  Labs:  CBC:  Recent Labs  02/04/15 1107 03/11/15 0921 03/21/15 0720  WBC 4.0 3.3* 4.0  HGB 12.3* 12.6* 13.1  HCT 36.7*  37.2* 39.2  PLT 188 190 193    COAGS:  Recent Labs  03/21/15 0720  INR 1.06  APTT 31    BMP:  Recent Labs  02/04/15 1107  NA 142  K 4.2  CO2 27  GLUCOSE 91  BUN 18.7  CALCIUM 9.4  CREATININE 1.1    LIVER FUNCTION TESTS:  Recent Labs  02/04/15 1107  BILITOT 0.40  AST 17  ALT 12  ALKPHOS 48  PROT 7.4  ALBUMIN 3.9    TUMOR MARKERS: No results for input(s): AFPTM, CEA, CA199, CHROMGRNA in the last 8760 hours.  Assessment and Plan: Marselino Slayton Clendenen is a 80 y.o. male with history of persistent anemia of unknown etiology as well as leukocytopenia who  presents today for CT guided bone marrow biopsy for further evaluation.Risks and benefits discussed with the patient/daughter including, but not limited to bleeding, infection, damage to adjacent structures or low yield requiring additional tests. All of the patient's questions were answered, patient is agreeable to proceed. Consent signed and in chart. EKG today shows a flutter. Will plan to review with Dr. Anselm Pancoast.      Thank you for this interesting consult.  I greatly enjoyed meeting Okie Bogacz Nabozny and look forward to participating in their care.  A copy of this report was sent to the requesting provider on this date.  Signed: D. Rowe Robert 03/21/2015, 8:28 AM   I spent a total of 15 minutes in face to face in clinical consultation, greater than 50% of which was counseling/coordinating care for CT guided bone marrow biopsy

## 2015-04-01 LAB — CHROMOSOME ANALYSIS, BONE MARROW

## 2015-04-08 ENCOUNTER — Encounter: Payer: Self-pay | Admitting: Internal Medicine

## 2015-04-08 ENCOUNTER — Other Ambulatory Visit (HOSPITAL_BASED_OUTPATIENT_CLINIC_OR_DEPARTMENT_OTHER): Payer: Commercial Managed Care - HMO

## 2015-04-08 ENCOUNTER — Ambulatory Visit (HOSPITAL_BASED_OUTPATIENT_CLINIC_OR_DEPARTMENT_OTHER): Payer: Commercial Managed Care - HMO | Admitting: Internal Medicine

## 2015-04-08 VITALS — BP 154/74 | HR 95 | Temp 98.1°F | Resp 19 | Ht 72.0 in | Wt 215.6 lb

## 2015-04-08 DIAGNOSIS — D539 Nutritional anemia, unspecified: Secondary | ICD-10-CM

## 2015-04-08 DIAGNOSIS — D72819 Decreased white blood cell count, unspecified: Secondary | ICD-10-CM

## 2015-04-08 DIAGNOSIS — D649 Anemia, unspecified: Secondary | ICD-10-CM | POA: Diagnosis not present

## 2015-04-08 LAB — CBC WITH DIFFERENTIAL/PLATELET
BASO%: 0.8 % (ref 0.0–2.0)
Basophils Absolute: 0 10*3/uL (ref 0.0–0.1)
EOS ABS: 0.1 10*3/uL (ref 0.0–0.5)
EOS%: 2.8 % (ref 0.0–7.0)
HEMATOCRIT: 38.7 % (ref 38.4–49.9)
HEMOGLOBIN: 13.1 g/dL (ref 13.0–17.1)
LYMPH#: 1.5 10*3/uL (ref 0.9–3.3)
LYMPH%: 37.1 % (ref 14.0–49.0)
MCH: 29.2 pg (ref 27.2–33.4)
MCHC: 33.9 g/dL (ref 32.0–36.0)
MCV: 86.2 fL (ref 79.3–98.0)
MONO#: 0.6 10*3/uL (ref 0.1–0.9)
MONO%: 14.3 % — ABNORMAL HIGH (ref 0.0–14.0)
NEUT#: 1.8 10*3/uL (ref 1.5–6.5)
NEUT%: 45 % (ref 39.0–75.0)
PLATELETS: 217 10*3/uL (ref 140–400)
RBC: 4.49 10*6/uL (ref 4.20–5.82)
RDW: 14.2 % (ref 11.0–14.6)
WBC: 4 10*3/uL (ref 4.0–10.3)
nRBC: 3 % — ABNORMAL HIGH (ref 0–0)

## 2015-04-08 NOTE — Progress Notes (Signed)
[place] [phone]   Fax:([phone]) [place], MD [place] [address]  DIAGNOSIS:  1) persistent anemia of unknown etiology questionable for anemia of chronic disease. Resolved 2) leukocytopenia. Resolved.  PRIOR THERAPY: Oral iron tablets with Integra plus 1 capsule by mouth daily  CURRENT THERAPY: None  INTERVAL HISTORY: Steven [name] 80 y.o. male returns to the clinic today for follow-up visit. The patient has been doing fine with no specific complaints. He denied having any significant fatigue weakness. He denied having any dizzy spells. He denied having any significant chest pain, shortness of breath, cough or hemoptysis. He has no nausea or vomiting. He had a recent CT-guided bone marrow biopsy and aspirate and he is here for evaluation and discussion of his biopsy results and recommendation regarding treatment of his condition.  MEDICAL HISTORY: Past Medical History  Diagnosis Date  . Cataract     left eye  . Numbness     both hands pt states pinched. 12-17-14 Gabapentin has improved.  . Carpal tunnel syndrome   . History of gout   . Psoriasis   . Enlarged prostate   . History of kidney stones     pt has one now but not giving him any problems    ALLERGIES:  has No Known Allergies.  MEDICATIONS:  Current Outpatient Prescriptions  Medication Sig Dispense Refill  . gabapentin (NEURONTIN) 300 MG capsule Take 300 mg by mouth 3 (three) times daily as needed.      No current facility-administered medications for this visit.    SURGICAL HISTORY:  Past Surgical History  Procedure Laterality Date  . Tonsillectomy      as child  . Right cataract removed     . Colonoscopy    . Cataract extraction w/phaco Left 03/15/2013    Procedure: CATARACT EXTRACTION PHACO AND INTRAOCULAR LENS PLACEMENT (IOC);  Surgeon: Adonis Brook, MD;  Location: [facility];  Service: Ophthalmology;   Laterality: Left;  . Colonoscopy with propofol N/A 12/24/2014    Procedure: COLONOSCOPY WITH PROPOFOL;  Surgeon: Garlan Fair, MD;  Location: [facility];  Service: Endoscopy;  Laterality: N/A;    REVIEW OF SYSTEMS:  A comprehensive review of systems was negative.   PHYSICAL EXAMINATION: General appearance: alert, cooperative and no distress Head: Normocephalic, without obvious abnormality, atraumatic Neck: no adenopathy, no JVD, supple, symmetrical, trachea midline and thyroid not enlarged, symmetric, no tenderness/mass/nodules Lymph nodes: Cervical, supraclavicular, and axillary nodes normal. Resp: clear to auscultation bilaterally Back: symmetric, no curvature. ROM normal. No CVA tenderness. Cardio: regular rate and rhythm, S1, S2 normal, no murmur, click, rub or gallop GI: soft, non-tender; bowel sounds normal; no masses,  no organomegaly Extremities: extremities normal, atraumatic, no cyanosis or edema  ECOG PERFORMANCE STATUS: 0 - Asymptomatic  Blood pressure 154/74, pulse 95, temperature 98.1 F (36.7 C), temperature source Oral, resp. rate 19, height 6' (1.829 m), weight 215 lb 9.6 oz (97.796 kg), SpO2 100 %.  LABORATORY DATA: Lab Results  Component Value Date   WBC 4.0 04/08/2015   HGB 13.1 04/08/2015   HCT 38.7 04/08/2015   MCV 86.2 04/08/2015   PLT 217 04/08/2015      Chemistry      Component Value Date/Time   NA 142 02/04/2015 1107   NA 136* 11/26/2013 0520   K 4.2 02/04/2015 1107   K 4.1 11/26/2013 0520   CL 99 11/26/2013 0520  CO2 27 02/04/2015 1107   CO2 24 11/26/2013 0520   BUN 18.7 02/04/2015 1107   BUN 21 11/26/2013 0520   CREATININE 1.1 02/04/2015 1107   CREATININE 1.20 11/26/2013 0520      Component Value Date/Time   CALCIUM 9.4 02/04/2015 1107   CALCIUM 9.0 11/26/2013 0520   ALKPHOS 48 02/04/2015 1107   ALKPHOS 51 11/25/2013 0526   AST 17 02/04/2015 1107   AST 20 11/25/2013 0526   ALT 12 02/04/2015 1107   ALT 11 11/25/2013 0526    BILITOT 0.40 02/04/2015 1107   BILITOT 0.5 11/25/2013 0526       RADIOGRAPHIC STUDIES: Ct Biopsy  03/21/2015   CLINICAL DATA:  Anemia of unknown etiology, possibly of chronic disease.  EXAM: CT GUIDED BONE MARROW ASPIRATES AND BIOPSY  Physician: Stephan Minister. Henn, MD  MEDICATIONS: 3 mg Versed, 50 mcg fentanyl.  Insert moderate  ANESTHESIA/SEDATION: Sedation time: 10 minutes  PROCEDURE: The procedure was explained to the patient. The risks and benefits of the procedure were discussed and the patient's questions were addressed. Informed consent was obtained from the patient.  Irregular heart rate identified on physical examination. ECG suggested atrial flutter with variable AV block. The patient was completely asymptomatic.  The patient was placed prone on CT scan. Images of the pelvis were obtained. The right side of back was prepped and draped in sterile fashion. The skin and right posterior iliac bone were anesthetized with 1% lidocaine. 11 gauge bone needle was directed into the right iliac bone with CT guidance. Two aspirates and one core biopsy obtained. Bandage placed over the puncture site.  FINDINGS: Needle directed into the posterior right iliac bone. Adequate specimens were obtained.  Estimated blood loss: Minimal  COMPLICATIONS: None  IMPRESSION: CT guided bone marrow aspirates and core biopsy.  The irregular heart rate and ECG findings were communicated with Dr. Worthy Flank office.   Electronically Signed   By: Markus Daft M.D.   On: 03/21/2015 10:08   Patient: Steven [name], Steven [name] Collected: 03/21/2015 Client: [facility] Accession: [id number] Received: 03/21/2015 Markus Daft DOB: Jul 08, 1934 Age: 72 Gender: M Reported: 03/22/2015 [address] Patient Ph: [phone] MRN #: [id number] [place], [place] [address] Visit #: [id number] Chart #: Phone: [phone] Fax: CC: Curt Bears, MD BONE MARROW REPORT FINAL DIAGNOSIS Diagnosis Bone Marrow, Aspirate,Biopsy, and Clot, right iliac -  HYPERCELLULAR BONE MARROW FOR AGE WITH TRILINEAGE HEMATOPOIESIS. - SEE COMMENT. PERIPHERAL BLOOD: - NO SIGNIFICANT MORPHOLOGIC ABNORMALITIES Diagnosis Note The bone marrow is slightly hypercellular for age with trilineage hematopoiesis and nonspecific changes. Significant dyspoiesis is not present but correlation with cytogenetic studies is recommended. There are a few very small interstitial lymphoid aggregates mostly composed of small lymphocytes and flow cytometric analysis failed to show any T or B-cell phenotypic abnormalities. Clinical correlation is recommended. (BNS:kh:ecj 03-22-15) Susanne Greenhouse MD Pathologist, Electronic Signature (Case signed 03/22/2015)  ASSESSMENT AND PLAN: This is a very pleasant 80 years old African-American male with history of persistent anemia for the questionable for anemia of chronic disease and the patient also developed leukocytopenia. These abnormalities are resolved on today's lab. His recent bone marrow biopsy and aspirate showed no significant concerning findings. I discussed the biopsy results with the patient today. I recommended for him to continue on observation with routine follow-up visit with his primary care physician. I don't see a need to see the patient at regular basis at this point but will be happy to see him in the future if needed. He  was advised to call immediately if he has any concerning symptoms. The patient voices understanding of current disease status and treatment options and is in agreement with the current care plan.  All questions were answered. The patient knows to call the clinic with any problems, questions or concerns. We can certainly see the patient much sooner if necessary.  Disclaimer: This note was dictated with voice recognition software. Similar sounding words can inadvertently be transcribed and may not be corrected upon review.

## 2015-06-10 DIAGNOSIS — Z23 Encounter for immunization: Secondary | ICD-10-CM | POA: Diagnosis not present

## 2015-06-17 DIAGNOSIS — H40013 Open angle with borderline findings, low risk, bilateral: Secondary | ICD-10-CM | POA: Diagnosis not present

## 2015-06-17 DIAGNOSIS — H02409 Unspecified ptosis of unspecified eyelid: Secondary | ICD-10-CM | POA: Diagnosis not present

## 2015-07-08 DIAGNOSIS — N528 Other male erectile dysfunction: Secondary | ICD-10-CM | POA: Diagnosis not present

## 2015-07-08 DIAGNOSIS — N401 Enlarged prostate with lower urinary tract symptoms: Secondary | ICD-10-CM | POA: Diagnosis not present

## 2015-07-08 DIAGNOSIS — Z0000 Encounter for general adult medical examination without abnormal findings: Secondary | ICD-10-CM | POA: Diagnosis not present

## 2015-07-08 DIAGNOSIS — R972 Elevated prostate specific antigen [PSA]: Secondary | ICD-10-CM | POA: Diagnosis not present

## 2015-07-08 DIAGNOSIS — N138 Other obstructive and reflux uropathy: Secondary | ICD-10-CM | POA: Diagnosis not present

## 2015-07-08 DIAGNOSIS — N281 Cyst of kidney, acquired: Secondary | ICD-10-CM | POA: Diagnosis not present

## 2015-11-15 ENCOUNTER — Emergency Department (HOSPITAL_COMMUNITY): Payer: Commercial Managed Care - HMO

## 2015-11-15 ENCOUNTER — Encounter (HOSPITAL_COMMUNITY): Payer: Self-pay | Admitting: Emergency Medicine

## 2015-11-15 ENCOUNTER — Emergency Department (HOSPITAL_COMMUNITY)
Admission: EM | Admit: 2015-11-15 | Discharge: 2015-11-15 | Disposition: A | Discharge status: Home / Self Care | Payer: Commercial Managed Care - HMO | Attending: Emergency Medicine | Admitting: Emergency Medicine

## 2015-11-15 DIAGNOSIS — Z8739 Personal history of other diseases of the musculoskeletal system and connective tissue: Secondary | ICD-10-CM | POA: Insufficient documentation

## 2015-11-15 DIAGNOSIS — I350 Nonrheumatic aortic (valve) stenosis: Secondary | ICD-10-CM | POA: Diagnosis not present

## 2015-11-15 DIAGNOSIS — R0602 Shortness of breath: Secondary | ICD-10-CM | POA: Diagnosis not present

## 2015-11-15 DIAGNOSIS — R011 Cardiac murmur, unspecified: Secondary | ICD-10-CM | POA: Diagnosis not present

## 2015-11-15 DIAGNOSIS — N401 Enlarged prostate with lower urinary tract symptoms: Secondary | ICD-10-CM | POA: Diagnosis not present

## 2015-11-15 DIAGNOSIS — Z87438 Personal history of other diseases of male genital organs: Secondary | ICD-10-CM | POA: Diagnosis not present

## 2015-11-15 DIAGNOSIS — Z87891 Personal history of nicotine dependence: Secondary | ICD-10-CM | POA: Insufficient documentation

## 2015-11-15 DIAGNOSIS — R0609 Other forms of dyspnea: Secondary | ICD-10-CM | POA: Diagnosis not present

## 2015-11-15 DIAGNOSIS — I4891 Unspecified atrial fibrillation: Secondary | ICD-10-CM | POA: Diagnosis not present

## 2015-11-15 DIAGNOSIS — D649 Anemia, unspecified: Secondary | ICD-10-CM | POA: Diagnosis not present

## 2015-11-15 DIAGNOSIS — Z87442 Personal history of urinary calculi: Secondary | ICD-10-CM | POA: Insufficient documentation

## 2015-11-15 DIAGNOSIS — Z8669 Personal history of other diseases of the nervous system and sense organs: Secondary | ICD-10-CM | POA: Diagnosis not present

## 2015-11-15 DIAGNOSIS — Z872 Personal history of diseases of the skin and subcutaneous tissue: Secondary | ICD-10-CM | POA: Insufficient documentation

## 2015-11-15 DIAGNOSIS — M109 Gout, unspecified: Secondary | ICD-10-CM | POA: Diagnosis not present

## 2015-11-15 DIAGNOSIS — I4892 Unspecified atrial flutter: Secondary | ICD-10-CM | POA: Diagnosis not present

## 2015-11-15 DIAGNOSIS — I499 Cardiac arrhythmia, unspecified: Secondary | ICD-10-CM | POA: Insufficient documentation

## 2015-11-15 DIAGNOSIS — Z0000 Encounter for general adult medical examination without abnormal findings: Secondary | ICD-10-CM | POA: Diagnosis not present

## 2015-11-15 DIAGNOSIS — I10 Essential (primary) hypertension: Secondary | ICD-10-CM | POA: Diagnosis not present

## 2015-11-15 HISTORY — DX: Unspecified atrial flutter: I48.92

## 2015-11-15 LAB — BASIC METABOLIC PANEL
Anion gap: 8 (ref 5–15)
BUN: 15 mg/dL (ref 6–20)
CHLORIDE: 104 mmol/L (ref 101–111)
CO2: 28 mmol/L (ref 22–32)
CREATININE: 1.27 mg/dL — AB (ref 0.61–1.24)
Calcium: 9.6 mg/dL (ref 8.9–10.3)
GFR calc Af Amer: >60 mL/min (ref >60)
GFR calc non Af Amer: 52 mL/min — ABNORMAL LOW (ref >60)
Glucose, Bld: 91 mg/dL (ref 65–99)
POTASSIUM: 4.3 mmol/L (ref 3.5–5.1)
SODIUM: 140 mmol/L (ref 135–145)

## 2015-11-15 LAB — CBC WITH DIFFERENTIAL/PLATELET
Basophils Absolute: 0 10*3/uL (ref 0.0–0.1)
Basophils Relative: 1 %
EOS ABS: 0.1 10*3/uL (ref 0.0–0.7)
EOS PCT: 3 %
HCT: 38.4 % — ABNORMAL LOW (ref 39.0–52.0)
HEMOGLOBIN: 12.8 g/dL — AB (ref 13.0–17.0)
LYMPHS ABS: 1.5 10*3/uL (ref 0.7–4.0)
LYMPHS PCT: 42 %
MCH: 28.6 pg (ref 26.0–34.0)
MCHC: 33.3 g/dL (ref 30.0–36.0)
MCV: 85.7 fL (ref 78.0–100.0)
MONOS PCT: 16 %
Monocytes Absolute: 0.6 10*3/uL (ref 0.1–1.0)
NEUTROS PCT: 38 %
Neutro Abs: 1.3 10*3/uL — ABNORMAL LOW (ref 1.7–7.7)
Platelets: 183 10*3/uL (ref 150–400)
RBC: 4.48 MIL/uL (ref 4.22–5.81)
RDW: 13.5 % (ref 11.5–15.5)
WBC: 3.5 10*3/uL — AB (ref 4.0–10.5)

## 2015-11-15 LAB — I-STAT TROPONIN, ED: Troponin i, poc: 0 ng/mL (ref 0.00–0.08)

## 2015-11-15 LAB — BRAIN NATRIURETIC PEPTIDE: B Natriuretic Peptide: 32.5 pg/mL (ref 0.0–100.0)

## 2015-11-15 NOTE — ED Notes (Signed)
PT was diagnosed with an "abnormal heart rhythm" 2 months ago. PT is unsure what the rhythm is called. PT was at his PCP today and a 12 lead EKG revealed atrial flutter. PT told his PCP that he had been diagnosed with an abnormal heart rhythm and had seen cardiology. PT's PCP recommended he be evaluated in ED. PT denies chest pain, SOB, or any other symptoms. PT reports this was a routine check up and he feels fine

## 2015-11-15 NOTE — ED Provider Notes (Signed)
CSN: [id number]     Arrival date & time 11/15/15  1255 History   First MD Initiated Contact with Patient 11/15/15 1300     Chief Complaint  Patient presents with  . Atrial Flutter   HPI Comments: 81 year old male presents with irregular heart rhythm and shortness of breath for the past 2 weeks. Past medical history significant for atrial flutter, pneumonia, HTN (untreated), heart murmur, former smoker. He states he was diagnosed with A. Flutter "years ago". He is unsure if he is going in and out of rhythm because he does not have any symptoms from it. He states he does not like to take medicines and therefore will not be anticoagulated. He saw his PCP earlier today for his SOB for the past 2 weeks. He states he gets SOB with exertion. Denies diaphoresis, chest pain, abdominal pain, N/V.    Past Medical History  Diagnosis Date  . Cataract     left eye  . Numbness     both hands pt states pinched. 12-17-14 Gabapentin has improved.  . Carpal tunnel syndrome   . History of gout   . Psoriasis   . Enlarged prostate   . History of kidney stones     pt has one now but not giving him any problems  . Atrial flutter [facility])    Past Surgical History  Procedure Laterality Date  . Tonsillectomy      as child  . Right cataract removed     . Colonoscopy    . Cataract extraction w/phaco Left 03/15/2013    Procedure: CATARACT EXTRACTION PHACO AND INTRAOCULAR LENS PLACEMENT (IOC);  Surgeon: Adonis Brook, MD;  Location: [place];  Service: Ophthalmology;  Laterality: Left;  . Colonoscopy with propofol N/A 12/24/2014    Procedure: COLONOSCOPY WITH PROPOFOL;  Surgeon: Garlan Fair, MD;  Location: [facility];  Service: Endoscopy;  Laterality: N/A;   No family history on file. Social History  Substance Use Topics  . Smoking status: Former Smoker    Types: Cigars  . Smokeless tobacco: None     Comment: smoked cigars 55yrs ago  . Alcohol Use: No    Review of Systems  Constitutional: Negative for  fever and chills.  Respiratory: Positive for shortness of breath. Negative for cough and wheezing.   Cardiovascular: Negative for chest pain.  Gastrointestinal: Negative for nausea, vomiting and abdominal pain.  Neurological: Negative for dizziness, speech difficulty, weakness, numbness and headaches.  All other systems reviewed and are negative.     Allergies  Review of patient's allergies indicates no known allergies.  Home Medications   Prior to Admission medications   Medication Sig Start Date End Date Taking? Authorizing Provider  gabapentin (NEURONTIN) 300 MG capsule Take 300 mg by mouth 3 (three) times daily as needed.     Historical Provider, MD   BP 151/96 mmHg  Pulse 78  Temp(Src) 97.8 F (36.6 C) (Oral)  Resp 12  Ht 6\' 1"  (1.854 m)  Wt 97.977 kg  BMI 28.50 kg/m2  SpO2 99%   Physical Exam  Constitutional: He is oriented to person, place, and time. He appears well-developed and well-nourished. No distress.  HENT:  Head: Normocephalic and atraumatic.  Eyes: Conjunctivae are normal. Pupils are equal, round, and reactive to light. Right eye exhibits no discharge. Left eye exhibits no discharge. No scleral icterus.  Neck: Normal range of motion.  Cardiovascular: Normal rate and intact distal pulses.  An irregular rhythm present. Exam reveals no gallop and no friction  rub.   Murmur heard. 3/6 murmur heard throughout the precordium  Pulmonary/Chest: Effort normal and breath sounds normal. No respiratory distress. He has no wheezes. He has no rales. He exhibits no tenderness.  Abdominal: Soft. Bowel sounds are normal. He exhibits no distension and no mass. There is no tenderness. There is no rebound and no guarding.  Neurological: He is alert and oriented to person, place, and time.  Skin: Skin is warm and dry. He is not diaphoretic.  Psychiatric: He has a normal mood and affect.    ED Course  Procedures (including critical care time) Labs Review Labs Reviewed   BASIC METABOLIC PANEL - Abnormal; Notable for the following:    Creatinine, Ser 1.27 (*)    GFR calc non Af Amer 52 (*)    All other components within normal limits  CBC WITH DIFFERENTIAL/PLATELET - Abnormal; Notable for the following:    WBC 3.5 (*)    Hemoglobin 12.8 (*)    HCT 38.4 (*)    Neutro Abs 1.3 (*)    All other components within normal limits  BRAIN NATRIURETIC PEPTIDE  I-STAT TROPOININ, ED    Imaging Review Dg Chest 2 View  11/15/2015  CLINICAL DATA:  Shortness of breath and atrial flutter. EXAM: CHEST  2 VIEW COMPARISON:  12/08/2013 FINDINGS: Heart and mediastinum are within normal limits. The lungs are clear without airspace disease or pulmonary edema. Few prominent lung markings in the left lower chest appear unchanged. Mild elevation of right hemidiaphragm is unchanged. No large pleural effusions. Degenerative changes in the lower thoracic spine. IMPRESSION: No active cardiopulmonary disease. Electronically Signed   By: Markus Daft M.D.   On: 11/15/2015 14:08   I have personally reviewed and evaluated these images and lab results as part of my medical decision-making.   EKG Interpretation   Date/Time:  Friday Nov 15 2015 13:03:43 EDT Ventricular Rate:  77 PR Interval:    QRS Duration: 99 QT Interval:  396 QTC Calculation: 448 R Axis:   -11 Text Interpretation:  Atrial flutter Nonspecific T abnormalities, inferior  leads ST elevation, consider inferior injury No significant change since  last tracing Confirmed by FLOYD MD, Quillian Quince [id number]) on 11/15/2015 1:38:33 PM      MDM   Final diagnoses:  SOB (shortness of breath)   81 year old male who presents with shortness of breath with exertion for the past 2 weeks. All vital signs are within normal limits and stable. Patient does have a history of atrial flutter and is not anticoagulated. No focal neuro deficits or concerns for stroke on his physical exam. CBC is unremarkable. BMP is remarkable for elevated creatinine  of 1.27. BNP is normal. Troponin is normal. Chest x-ray is normal. Unsure of the etiology of his symptoms. Shared visit with Dr. Tyrone Nine. Patient feels comfortable going home and following up with his PCP. Patient is NAD, non-toxic, with stable VS. Patient is informed of clinical course, understands medical decision making process, and agrees with plan. Opportunity for questions provided and all questions answered. Return precautions given.   Recardo Evangelist, PA-C 11/15/15 Newborn, DO 11/15/15 [phone]

## 2015-11-18 DIAGNOSIS — N401 Enlarged prostate with lower urinary tract symptoms: Secondary | ICD-10-CM | POA: Diagnosis not present

## 2015-11-18 DIAGNOSIS — N138 Other obstructive and reflux uropathy: Secondary | ICD-10-CM | POA: Diagnosis not present

## 2015-11-18 DIAGNOSIS — N281 Cyst of kidney, acquired: Secondary | ICD-10-CM | POA: Diagnosis not present

## 2015-11-18 DIAGNOSIS — N528 Other male erectile dysfunction: Secondary | ICD-10-CM | POA: Diagnosis not present

## 2015-11-18 DIAGNOSIS — Z0000 Encounter for general adult medical examination without abnormal findings: Secondary | ICD-10-CM | POA: Diagnosis not present

## 2015-11-22 ENCOUNTER — Encounter: Payer: Self-pay | Admitting: Cardiology

## 2015-11-22 DIAGNOSIS — E668 Other obesity: Secondary | ICD-10-CM | POA: Diagnosis not present

## 2015-11-22 DIAGNOSIS — R42 Dizziness and giddiness: Secondary | ICD-10-CM | POA: Diagnosis not present

## 2015-11-22 DIAGNOSIS — R011 Cardiac murmur, unspecified: Secondary | ICD-10-CM | POA: Diagnosis not present

## 2015-11-22 DIAGNOSIS — I350 Nonrheumatic aortic (valve) stenosis: Secondary | ICD-10-CM | POA: Diagnosis not present

## 2015-11-22 DIAGNOSIS — I10 Essential (primary) hypertension: Secondary | ICD-10-CM | POA: Diagnosis not present

## 2015-11-22 DIAGNOSIS — I491 Atrial premature depolarization: Secondary | ICD-10-CM | POA: Diagnosis not present

## 2015-11-22 DIAGNOSIS — I483 Typical atrial flutter: Secondary | ICD-10-CM | POA: Diagnosis not present

## 2015-11-22 NOTE — Progress Notes (Signed)
Patient ID: Nikolus Chavanne Schlottman, male   DOB: 18-Sep-1934, 81 y.o.   MRN: [id number]  Muzio, Isaah Verona  Date of visit:  11/22/2015 DOB:  28-Apr-1935    Age:  81 yrs. Medical record number:  [id number]     Account number:  [id number] Primary Care Provider: Hulan Fess ____________________________ CURRENT DIAGNOSES  1. Aortic valve stenosis  2. Atrial flutter, typical  3. Atrial premature depolarization  4. Obesity  5. Essential hypertension  6. Murmur  7. Dizziness and giddiness ____________________________ ALLERGIES  No Known Allergies ____________________________ MEDICATIONS  1. Eliquis 5 mg tablet, BID ____________________________ CHIEF COMPLAINTS  atrial flutter ____________________________ HISTORY OF PRESENT ILLNESS Patient returns early for evaluation of atrial flutter. The patient was noted to have atrial flutter recently had an examination by his primary doctor. He complained of some mild dizziness and some early palpitations as well as fatigue and was found to be in typical atrial flutter. He previously was in sinus rhythm. He denies angina and has no PND, orthopnea or claudication. He has not had atrial flutter previously that we can tell and has not had syncope. He had no history of GI bleeding. ____________________________ PAST HISTORY  Past Medical Illnesses:  hypertension, BPH, gout, peripheral edema, pneumonia, TMJ, obesity, cervical disc disease, cervical spondylosis;  Cardiovascular Illnesses:  aortic stenosis, atrial flutter;  Surgical Procedures:  cataract extraction OU, finger injury;  Cardiology Procedures-Invasive:  cardiac cath (left) 2002;  Cardiology Procedures-Noninvasive:  treadmill, echocardiogram May 2016;  LVEF of 55% documented via echocardiogram on 11/05/2014,   ____________________________ CARDIO-PULMONARY TEST DATES EKG Date:  11/22/2015;  Echocardiography Date: 11/05/2014;   ____________________________ FAMILY HISTORY Brother -- Brother alive and well Brother --  Brother alive with problem, Hypercholesterolemia Father -- Father dead, Glaucoma, Heart disease, Arthritis Mother -- Mother dead, Hypertension, Diabetes mellitus, kidney disease, Arthritis Sister -- Sister alive with problem, Diabetes mellitus, Hypertension, kidney disease, Hypercholesterolemia Sister -- Sister alive and well ____________________________ SOCIAL HISTORY Alcohol Use:  no alcohol use;  Smoking:  used to smoke but quit Prior to 1980;  Diet:  regular diet;  Lifestyle:  widower;  Exercise:  no regular exercise;  Occupation:  retired and [employer];   ____________________________ REVIEW OF SYSTEMS General:  denies recent weight change, fatique or change in exercise tolerance.  Integumentary:psoriasis Eyes: decreased acuity, cataract extraction bilaterally Ears, Nose, Throat, Mouth:  denies any hearing loss, epistaxis, hoarseness or difficulty speaking. Respiratory: denies dyspnea, cough, wheezing or hemoptysis. Cardiovascular:  please review HPI Abdominal: denies dyspepsia, GI bleeding, constipation, or diarrhea Genitourinary-Male: frequency, nocturia, BPH  Musculoskeletal:  cervical spondylosis Neurological:  dizziness Psychiatric:  denies depression or anxiety  ____________________________ PHYSICAL EXAMINATION VITAL SIGNS  Blood Pressure:  140/70 Sitting, Right arm, regular cuff  , 120/54 Standing, Right arm and regular cuff   Pulse:  96/min. Weight:  214.00 lbs. Height:  73"BMI: 28  Constitutional:  pleasant African Americian male in no acute distress Skin:  warm and dry to touch, no apparent skin lesions, or masses noted. Head:  normocephalic, balding male hair pattern Eyes:  EOMS Intact, PERRLA, C and S clear, Funduscopic exam not done. ENT:  ears, nose and throat reveal no gross abnormalities.  Dentition good. Neck:  supple, without massess. No JVD, thyromegaly or carotid bruits. Carotid upstroke normal. Chest:  normal symmetry, clear to auscultation. Cardiac:  normal S1 and  S2, no S3 or S4, grade 2/6 systolic murmur at aortic area radiating to neck, irregularly irregular rhythm Abdomen:  abdomen soft,non-tender, no masses, no hepatospenomegaly,  or aneurysm noted Extremities & Back:  no deformities, clubbing, cyanosis, erythema or edema observed. Normal muscle strength and tone. Neurological:  no gross motor or sensory deficits noted, affect appropriate, oriented x3. ____________________________ MOST RECENT LIPID PANEL 11/03/13  CHOL TOTL 182 mg/dl, LDL 117 NM, HDL 50 mg/dl and TRIGLYCER 76 mg/dl ____________________________ IMPRESSIONS/PLAN  1. New-onset of atrial flutter 2. Aortic stenosis 3. Hypertensive heart disease 4. Cervical disc disease  Recommendations:  Discussion with patient about atrial arrhythmias and atrial flutter. HIs CHA2DS2VASC score is 3 at this time. We discussed atrial flutter and stroke risk in detail with him. We discussed the difference in stroke prevention with aspirin versus Eliquis. His wife evidently had a bad experience with anticoagulation and we also discussed the potential of atrial flutter ablation as an option. At this point in time I would recommend that he be anticoagulated with Eliquis and that he have a referral to Dr. electrophysiology doctor about ablation. I will get a repeat echocardiogram to assess left atrial size.  Greater than 40 minutes spent with patient including greater than 50% of the time spent in face-to-face encounter with patient when examination, discussion of plan and review of systems.  ____________________________ TODAYS ORDERS  1. 12 Lead EKG: Today  2. Return with ECHO:  3. 2D, color flow, doppler: First Available  4.  Consult EP: ASAP                       ____________________________ Cardiology Physician:  Kerry Hough MD [facility]

## 2015-11-26 NOTE — Progress Notes (Signed)
Patient ID: Steven [name], male   DOB: 1935/01/07, 81 y.o.   MRN: [id number] i would be glad to see him for flutter ablation Thanks

## 2015-12-02 ENCOUNTER — Other Ambulatory Visit: Payer: Self-pay

## 2015-12-02 ENCOUNTER — Ambulatory Visit (INDEPENDENT_AMBULATORY_CARE_PROVIDER_SITE_OTHER): Payer: Commercial Managed Care - HMO | Admitting: Internal Medicine

## 2015-12-02 VITALS — BP 146/82 | HR 104 | Ht 73.0 in | Wt 217.6 lb

## 2015-12-02 DIAGNOSIS — I4892 Unspecified atrial flutter: Secondary | ICD-10-CM | POA: Diagnosis not present

## 2015-12-02 DIAGNOSIS — R208 Other disturbances of skin sensation: Secondary | ICD-10-CM | POA: Diagnosis not present

## 2015-12-02 DIAGNOSIS — R200 Anesthesia of skin: Secondary | ICD-10-CM

## 2015-12-02 NOTE — Progress Notes (Signed)
ELECTROPHYSIOLOGY CONSULT NOTE  Patient ID: Steven [name], MRN: [id number], DOB/AGE: Mar 03, 1935 81 y.o. Admit date: (Not on file) Date of Consult: 12/02/2015  Primary Physician: Gennette Pac, MD Primary Cardiologist: [facility] Consulting Physician [place]  Chief Complaint: AFlutter   HPI Steven [name] is a 81 y.o. male  Referred for consideration of atrial flutter ablation  He initially came to attention because of some dizziness and palpitations and fatigue. His PCP noted atrial flutter and referred for further consideration.  Retrospectively, he is not sure that he has had any significant attributable symptoms. He has no palpitations.  He's had a  hematologic issue with biopsies a couple years ago for persistent anemia he also has modest leukopenia this was unrevealing  Cardiac evaluation the past included an echocardiogram 2016 with normal LVEF. Echocardiogram 2015 had mild aortic stenosis with a peak gradient of 27 Catheterization 2002 was normal.  His past medical history In Epic bilateral hand numbness; on report the patient describes an episode of transient left arm numbness while driving a car about a year ago. It lasted 10-15 minutes.       Past Medical History  Diagnosis Date  . Cataract     left eye  . Numbness     both hands pt states pinched. 12-17-14 Gabapentin has improved.  . Carpal tunnel syndrome   . History of gout   . Psoriasis   . Enlarged prostate   . History of kidney stones     pt has one now but not giving him any problems  . Atrial flutter [facility])       Surgical History:  Past Surgical History  Procedure Laterality Date  . Tonsillectomy      as child  . Right cataract removed     . Colonoscopy    . Cataract extraction w/phaco Left 03/15/2013    Procedure: CATARACT EXTRACTION PHACO AND INTRAOCULAR LENS PLACEMENT (IOC);  Surgeon: Adonis Brook, MD;  Location: [place];  Service: Ophthalmology;  Laterality: Left;  . Colonoscopy with propofol  N/A 12/24/2014    Procedure: COLONOSCOPY WITH PROPOFOL;  Surgeon: Garlan Fair, MD;  Location: [facility];  Service: Endoscopy;  Laterality: N/A;     Home Meds: Prior to Admission medications   Medication Sig Start Date End Date Taking? Authorizing Provider  ELIQUIS 5 MG TABS tablet Take 5 mg by mouth 2 (two) times daily. 11/22/15  Yes Historical Provider, MD    Allergies: No Known Allergies  Social History   Social History  . Marital Status: Widowed    Spouse Name: N/A  . Number of Children: N/A  . Years of Education: N/A   Occupational History  . Not on file.   Social History Main Topics  . Smoking status: Former Smoker    Types: Cigars  . Smokeless tobacco: Not on file     Comment: smoked cigars 84yrs ago  . Alcohol Use: No  . Drug Use: No  . Sexual Activity: No   Other Topics Concern  . Not on file   Social History Narrative     Family History  Problem Relation Age of Onset  . Other Mother     Psychologist, forensic  . Healthy Father      ROS:  Please see the history of present illness.     All other systems reviewed and negative.    Physical Exam:   Blood pressure 146/82, pulse 104, height 6\' 1"  (1.854 m), weight 217 lb 9.6  oz (98.703 kg). General: Well developed, well nourished male in no acute distress. Head: Normocephalic, atraumatic, sclera non-icteric, no xanthomas, nares are without discharge. EENT: normal  Lymph Nodes:  none Neck: Negative for carotid bruits. JVD 8 Back:without scoliosis kyphosis  Lungs: Clear bilaterally to auscultation without wheezes, rales, or rhonchi. Breathing is unlabored. Heart: Irrefular rate and rhyhtm murmur . No rubs, or gallops appreciated. Abdomen: Soft, non-tender, non-distended with normoactive bowel sounds. No hepatomegaly. No rebound/guarding. No obvious abdominal masses. Msk:  Strength and tone appear normal for age. Extremities: No clubbing or cyanosis. No  edema.  Distal pedal pulses are 2+ and equal  bilaterally. Skin: Warm and Dry Neuro: Alert and oriented X 3. CN III-XII intact Grossly normal sensory and motor function . Psych:  Responds to questions appropriately with a normal affect.      Labs: Cardiac Enzymes No results for input(s): CKTOTAL, CKMB, TROPONINI in the last 72 hours. CBC Lab Results  Component Value Date   WBC 3.5* 11/15/2015   HGB 12.8* 11/15/2015   HCT 38.4* 11/15/2015   MCV 85.7 11/15/2015   PLT 183 11/15/2015   PROTIME: No results for input(s): LABPROT, INR in the last 72 hours. Chemistry No results for input(s): NA, K, CL, CO2, BUN, CREATININE, CALCIUM, PROT, BILITOT, ALKPHOS, ALT, AST, GLUCOSE in the last 168 hours.  Invalid input(s): LABALBU Lipids No results found for: CHOL, HDL, LDLCALC, TRIG BNP No results found for: PROBNP Thyroid Function Tests: No results for input(s): TSH, T4TOTAL, T3FREE, THYROIDAB in the last 72 hours.  Invalid input(s): FREET3 Miscellaneous No results found for: DDIMER  Radiology/Studies:  Dg Chest 2 View  11/15/2015  CLINICAL DATA:  Shortness of breath and atrial flutter. EXAM: CHEST  2 VIEW COMPARISON:  12/08/2013 FINDINGS: Heart and mediastinum are within normal limits. The lungs are clear without airspace disease or pulmonary edema. Few prominent lung markings in the left lower chest appear unchanged. Mild elevation of right hemidiaphragm is unchanged. No large pleural effusions. Degenerative changes in the lower thoracic spine. IMPRESSION: No active cardiopulmonary disease. Electronically Signed   By: Markus Daft M.D.   On: 11/15/2015 14:08    EKG:  Atrial flutter at 104 Intervals-/09/33 Typical flutter waves inverted 34F upright V1   Assessment and Plan:  Atrial flutter typical  Hypertension  Transient L arm numbness    The patient has typical atrial flutter. he is asymptomatic. Hence, the indication for proceeding with flutter ablation would be ability to discontinue his anticoagulation. This issue is  controversial anyway as recent papers as suggested the incidence of atrial fibrillation following atrial flutter ablation can be as high as 75% of patients are followed with implantable loop recorder. Still, this affords a 25% chance of being able to discontinue anticoagulation with its 0.3-0.5% risk of significant bleeding   however, if this left arm numbness represented a embolic event anticoagulation would be indicated indefinitely. Hence, prior to proceed with catheter ablation we will undertake an MRI looking for evidence of such.    I reviewed with him and his daughter the procedure catheter ablation is benefits and risks including but not limited to catastrophic outcomes as well as needed for pacemaker implantation.     [name]

## 2015-12-02 NOTE — Patient Instructions (Signed)
Medication Instructions: - Your physician recommends that you continue on your current medications as directed. Please refer to the Current Medication list given to you today.  Labwork: - none  Procedures/Testing: - Your physician recommends that you have a brain MRI  Follow-Up: - Pending the results of the brain MRI.  Any Additional Special Instructions Will Be Listed Below (If Applicable). - You are scheduled for an Echocardiogram at 11:00 am on Monday 12/09/15 at Dr. Thurman Coyer office - You will see Dr. Wynonia Lawman just after your echo is done on Monday     If you need a refill on your cardiac medications before your next appointment, please call your pharmacy.

## 2015-12-06 ENCOUNTER — Telehealth: Payer: Self-pay | Admitting: Internal Medicine

## 2015-12-06 NOTE — Telephone Encounter (Signed)
Order for MRI of brain needs to be signed - patient scheduled for MRI on 6/12 @ 0800

## 2015-12-06 NOTE — Telephone Encounter (Signed)
[facility]- Radiology stated that the order for pt's upcoming MRI on 6/12- needs to be either e-signed or faxed w/ Dr Olin Pia signature- to [phone]. Please advise.

## 2015-12-09 ENCOUNTER — Institutional Professional Consult (permissible substitution): Payer: Commercial Managed Care - HMO | Admitting: Internal Medicine

## 2015-12-09 ENCOUNTER — Ambulatory Visit (HOSPITAL_COMMUNITY)
Admission: RE | Admit: 2015-12-09 | Payer: Commercial Managed Care - HMO | Source: Ambulatory Visit | Admitting: Internal Medicine

## 2015-12-09 DIAGNOSIS — I483 Typical atrial flutter: Secondary | ICD-10-CM | POA: Diagnosis not present

## 2015-12-09 DIAGNOSIS — R42 Dizziness and giddiness: Secondary | ICD-10-CM | POA: Diagnosis not present

## 2015-12-09 DIAGNOSIS — Z7901 Long term (current) use of anticoagulants: Secondary | ICD-10-CM | POA: Diagnosis not present

## 2015-12-09 DIAGNOSIS — R011 Cardiac murmur, unspecified: Secondary | ICD-10-CM | POA: Diagnosis not present

## 2015-12-09 DIAGNOSIS — I10 Essential (primary) hypertension: Secondary | ICD-10-CM | POA: Diagnosis not present

## 2015-12-09 DIAGNOSIS — E668 Other obesity: Secondary | ICD-10-CM | POA: Diagnosis not present

## 2015-12-09 DIAGNOSIS — I491 Atrial premature depolarization: Secondary | ICD-10-CM | POA: Diagnosis not present

## 2015-12-09 DIAGNOSIS — I350 Nonrheumatic aortic (valve) stenosis: Secondary | ICD-10-CM | POA: Diagnosis not present

## 2015-12-09 NOTE — Telephone Encounter (Signed)
Order was e-signed on 12/07/15 by Dr. Caryl Comes.

## 2015-12-13 ENCOUNTER — Ambulatory Visit (HOSPITAL_COMMUNITY)
Admission: RE | Admit: 2015-12-13 | Discharge: 2015-12-13 | Disposition: A | Discharge status: Home / Self Care | Payer: Commercial Managed Care - HMO | Source: Ambulatory Visit | Attending: Internal Medicine | Admitting: Internal Medicine

## 2015-12-13 DIAGNOSIS — D1779 Benign lipomatous neoplasm of other sites: Secondary | ICD-10-CM | POA: Insufficient documentation

## 2015-12-13 DIAGNOSIS — R208 Other disturbances of skin sensation: Secondary | ICD-10-CM | POA: Diagnosis not present

## 2015-12-13 DIAGNOSIS — G9589 Other specified diseases of spinal cord: Secondary | ICD-10-CM | POA: Diagnosis not present

## 2015-12-13 DIAGNOSIS — R200 Anesthesia of skin: Secondary | ICD-10-CM

## 2015-12-13 DIAGNOSIS — R9082 White matter disease, unspecified: Secondary | ICD-10-CM | POA: Diagnosis not present

## 2015-12-13 DIAGNOSIS — G319 Degenerative disease of nervous system, unspecified: Secondary | ICD-10-CM | POA: Insufficient documentation

## 2015-12-13 DIAGNOSIS — M5021 Other cervical disc displacement,  high cervical region: Secondary | ICD-10-CM | POA: Insufficient documentation

## 2016-02-10 DIAGNOSIS — I483 Typical atrial flutter: Secondary | ICD-10-CM | POA: Diagnosis not present

## 2016-02-10 DIAGNOSIS — Z7901 Long term (current) use of anticoagulants: Secondary | ICD-10-CM | POA: Diagnosis not present

## 2016-02-10 DIAGNOSIS — E668 Other obesity: Secondary | ICD-10-CM | POA: Diagnosis not present

## 2016-02-10 DIAGNOSIS — I350 Nonrheumatic aortic (valve) stenosis: Secondary | ICD-10-CM | POA: Diagnosis not present

## 2016-02-10 DIAGNOSIS — I10 Essential (primary) hypertension: Secondary | ICD-10-CM | POA: Diagnosis not present

## 2016-02-10 DIAGNOSIS — I491 Atrial premature depolarization: Secondary | ICD-10-CM | POA: Diagnosis not present

## 2016-02-18 ENCOUNTER — Encounter: Payer: Self-pay | Admitting: Internal Medicine

## 2016-02-18 ENCOUNTER — Encounter: Payer: Self-pay | Admitting: *Deleted

## 2016-02-18 ENCOUNTER — Encounter (INDEPENDENT_AMBULATORY_CARE_PROVIDER_SITE_OTHER): Payer: Self-pay

## 2016-02-18 ENCOUNTER — Ambulatory Visit (INDEPENDENT_AMBULATORY_CARE_PROVIDER_SITE_OTHER): Payer: Commercial Managed Care - HMO | Admitting: Internal Medicine

## 2016-02-18 VITALS — BP 148/64 | HR 70 | Ht 73.0 in | Wt 217.2 lb

## 2016-02-18 DIAGNOSIS — I483 Typical atrial flutter: Secondary | ICD-10-CM

## 2016-02-18 DIAGNOSIS — N281 Cyst of kidney, acquired: Secondary | ICD-10-CM | POA: Diagnosis not present

## 2016-02-18 DIAGNOSIS — Z01812 Encounter for preprocedural laboratory examination: Secondary | ICD-10-CM

## 2016-02-18 DIAGNOSIS — N5201 Erectile dysfunction due to arterial insufficiency: Secondary | ICD-10-CM | POA: Diagnosis not present

## 2016-02-18 DIAGNOSIS — N400 Enlarged prostate without lower urinary tract symptoms: Secondary | ICD-10-CM | POA: Diagnosis not present

## 2016-02-18 NOTE — Patient Instructions (Signed)
Medication Instructions: - Your physician recommends that you continue on your current medications as directed. Please refer to the Current Medication list given to you today.  Labwork: - see procedure instruction sheet  Procedures/Testing: - Your physician has recommended that you have an atrial flutter ablation. Catheter ablation is a medical procedure used to treat some cardiac arrhythmias (irregular heartbeats). During catheter ablation, a long, thin, flexible tube is put into a blood vessel in your groin (upper thigh), or neck. This tube is called an ablation catheter. It is then guided to your heart through the blood vessel. Radio frequency waves destroy small areas of heart tissue where abnormal heartbeats may cause an arrhythmia to start. Please see the instruction sheet given to you today.  Follow-Up: - Your physician recommends that you schedule a follow-up appointment in: 4 weeks (post procedure- from 02/28/16) with Chanetta Marshall, NP or Tommye Standard, PA for Dr. Caryl Comes.  Any Additional Special Instructions Will Be Listed Below (If Applicable).     If you need a refill on your cardiac medications before your next appointment, please call your pharmacy.

## 2016-02-18 NOTE — Progress Notes (Signed)
Patient Care Team: Hulan Fess, MD as PCP - General (Family Medicine)   HPI  Steven [name] is a 81 y.o. male Seen in follow-up for atrial flutter.   He initially came to attention because of some dizziness and palpitations and fatigue. His PCP noted atrial flutter and referred for further consideration.  Retrospectively, he is not sure that he has had any significant attributable symptoms. He has no palpitations.  He's had a  hematologic issue with biopsies a couple years ago for persistent anemia he also has modest leukopenia this was unrevealing  Cardiac evaluation the past included an echocardiogram 2016 with normal LVEF. Echocardiogram 2015 had mild aortic stenosis with a peak gradient of 27 Catheterization 2002 was normal.  History of bilateral hand numbness prompted MRI imaging of his brain. This was associated with chronic ischemic lesions but no acute stroke  The original referral had been for consideration of catheter ablation. Because of the issue of the CNS events, the value of ablation in a patient who had no attributable symptoms came S clear. He is returning today for further discussion   Records and Results Reviewed* As above   Past Medical History:  Diagnosis Date  . Atrial flutter ([place])   . Carpal tunnel syndrome   . Cataract    left eye  . Enlarged prostate   . History of gout   . History of kidney stones    pt has one now but not giving him any problems  . Numbness    both hands pt states pinched. 12-17-14 Gabapentin has improved.  . Psoriasis     Past Surgical History:  Procedure Laterality Date  . CATARACT EXTRACTION W/PHACO Left 03/15/2013   Procedure: CATARACT EXTRACTION PHACO AND INTRAOCULAR LENS PLACEMENT (IOC);  Surgeon: Adonis Brook, MD;  Location: [place];  Service: Ophthalmology;  Laterality: Left;  . COLONOSCOPY    . COLONOSCOPY WITH PROPOFOL N/A 12/24/2014   Procedure: COLONOSCOPY WITH PROPOFOL;  Surgeon: Garlan Fair, MD;   [facility];  Service: Endoscopy;  Laterality: N/A;  . right cataract removed     . TONSILLECTOMY     as child    Current Outpatient Prescriptions  Medication Sig Dispense Refill  . ELIQUIS 5 MG TABS tablet Take 5 mg by mouth 2 (two) times daily.  12   No current facility-administered medications for this visit.     No Known Allergies    Review of Systems negative except from HPI and PMH  Physical Exam BP (!) 148/64   Pulse 70   Ht 6\' 1"  (1.854 m)   Wt 217 lb 3.2 oz (98.5 kg)   SpO2 97%   BMI 28.66 kg/m  Well developed and well nourished in no acute distress HENT normal E scleral and icterus clear Neck Supple JVP flat; carotids brisk and full Clear to ausculation  Regular rate and rhythm, no murmurs gallops or rub Soft with active bowel sounds No clubbing cyanosis  Edema Alert and oriented, grossly normal motor and sensory function Skin Warm and Dry    Assessment and  Plan  Atrial Flutter  MRI  >  bilateral chronic microvascular ischemia; I reviewed this with the radiologist today. These are inconsistent with thromboembolic events which are typically cortical   We have discussed proceeding w catheter ablation to reduce the risk of recurrence and hopefully be able to discontiue anticoagulation   Risk and benefits discussed    Current medicines are reviewed at length with  the patient today .  The patient does not  have concerns regarding medicines.

## 2016-02-26 ENCOUNTER — Ambulatory Visit (INDEPENDENT_AMBULATORY_CARE_PROVIDER_SITE_OTHER): Payer: Commercial Managed Care - HMO | Admitting: *Deleted

## 2016-02-26 ENCOUNTER — Other Ambulatory Visit: Payer: Commercial Managed Care - HMO | Admitting: *Deleted

## 2016-02-26 VITALS — BP 130/80 | HR 66 | Wt 217.4 lb

## 2016-02-26 DIAGNOSIS — I483 Typical atrial flutter: Secondary | ICD-10-CM

## 2016-02-26 DIAGNOSIS — Z01812 Encounter for preprocedural laboratory examination: Secondary | ICD-10-CM | POA: Diagnosis not present

## 2016-02-26 DIAGNOSIS — I4892 Unspecified atrial flutter: Secondary | ICD-10-CM

## 2016-02-26 LAB — CBC WITH DIFFERENTIAL/PLATELET
Basophils Absolute: 38 cells/uL (ref 0–200)
Basophils Relative: 1 %
Eosinophils Absolute: 76 cells/uL (ref 15–500)
Eosinophils Relative: 2 %
HEMATOCRIT: 36.1 % — AB (ref 38.5–50.0)
Hemoglobin: 12.3 g/dL — ABNORMAL LOW (ref 13.2–17.1)
LYMPHS PCT: 50 %
Lymphs Abs: 1900 cells/uL (ref 850–3900)
MCH: 28.7 pg (ref 27.0–33.0)
MCHC: 34.1 g/dL (ref 32.0–36.0)
MCV: 84.3 fL (ref 80.0–100.0)
MONO ABS: 494 {cells}/uL (ref 200–950)
MPV: 10.8 fL (ref 7.5–12.5)
Monocytes Relative: 13 %
NEUTROS PCT: 34 %
Neutro Abs: 1292 cells/uL — ABNORMAL LOW (ref 1500–7800)
Platelets: 199 10*3/uL (ref 140–400)
RBC: 4.28 MIL/uL (ref 4.20–5.80)
RDW: 14.2 % (ref 11.0–15.0)
WBC: 3.8 10*3/uL (ref 3.8–10.8)

## 2016-02-26 NOTE — Patient Instructions (Signed)
Medication Instructions:  HOLD YOUR ELIQUIS FOR YOUR PROCEDURE ON FRIDAY  Labwork: NONE  Testing/Procedures: NONE  Follow-Up: KEEP FOLLOW UP AS SCHEDULED

## 2016-02-26 NOTE — Progress Notes (Signed)
1.) Reason for visit: EKG  2.) Name of MD requesting visit: Caryl Comes  3.) H&P:  Patient in office for EKG prior to Atrial Flutter Ablation.  4.) Assessment and plan per MD: EKG reviewed by Dr Caryl Comes, NSR and ok to hold Eliquis. Recommendations reviewed with patient.

## 2016-02-27 LAB — BASIC METABOLIC PANEL
BUN: 14 mg/dL (ref 7–25)
CALCIUM: 9.4 mg/dL (ref 8.6–10.3)
CO2: 28 mmol/L (ref 20–31)
Chloride: 107 mmol/L (ref 98–110)
Creat: 1.05 mg/dL (ref 0.70–1.18)
GLUCOSE: 83 mg/dL (ref 65–99)
POTASSIUM: 4.4 mmol/L (ref 3.5–5.3)
Sodium: 140 mmol/L (ref 135–146)

## 2016-02-28 ENCOUNTER — Ambulatory Visit (HOSPITAL_COMMUNITY)
Admission: RE | Admit: 2016-02-28 | Discharge: 2016-02-28 | Disposition: A | Discharge status: Home / Self Care | Payer: Commercial Managed Care - HMO | Source: Ambulatory Visit | Attending: Internal Medicine | Admitting: Internal Medicine

## 2016-02-28 ENCOUNTER — Encounter (HOSPITAL_COMMUNITY)
Admission: RE | Disposition: A | Discharge status: Home / Self Care | Payer: Self-pay | Source: Ambulatory Visit | Attending: Internal Medicine

## 2016-02-28 ENCOUNTER — Ambulatory Visit (HOSPITAL_COMMUNITY): Payer: Commercial Managed Care - HMO | Admitting: Certified Registered Nurse Anesthetist

## 2016-02-28 ENCOUNTER — Encounter (HOSPITAL_COMMUNITY): Payer: Self-pay | Admitting: Internal Medicine

## 2016-02-28 DIAGNOSIS — I4892 Unspecified atrial flutter: Secondary | ICD-10-CM

## 2016-02-28 DIAGNOSIS — Z7901 Long term (current) use of anticoagulants: Secondary | ICD-10-CM | POA: Insufficient documentation

## 2016-02-28 DIAGNOSIS — I4891 Unspecified atrial fibrillation: Secondary | ICD-10-CM | POA: Diagnosis not present

## 2016-02-28 DIAGNOSIS — I483 Typical atrial flutter: Secondary | ICD-10-CM

## 2016-02-28 DIAGNOSIS — Z87891 Personal history of nicotine dependence: Secondary | ICD-10-CM | POA: Diagnosis not present

## 2016-02-28 HISTORY — PX: ELECTROPHYSIOLOGIC STUDY: SHX172A

## 2016-02-28 HISTORY — DX: Pneumonia, unspecified organism: J18.9

## 2016-02-28 SURGERY — A-FLUTTER/A-TACH/SVT ABLATION
Anesthesia: Monitor Anesthesia Care

## 2016-02-28 MED ORDER — MIDAZOLAM HCL 5 MG/5ML IJ SOLN
INTRAMUSCULAR | Status: DC | PRN
  Administered 2016-02-28: 2 mg via INTRAVENOUS

## 2016-02-28 MED ORDER — SODIUM CHLORIDE 0.9 % IV SOLN
INTRAVENOUS | Status: DC
  Administered 2016-02-28: 08:00:00 via INTRAVENOUS

## 2016-02-28 MED ORDER — HEPARIN (PORCINE) IN NACL 2-0.9 UNIT/ML-% IJ SOLN
INTRAMUSCULAR | Status: DC | PRN
  Administered 2016-02-28: 12:00:00

## 2016-02-28 MED ORDER — PHENYLEPHRINE HCL 10 MG/ML IJ SOLN
INTRAMUSCULAR | Status: DC | PRN
  Administered 2016-02-28: 40 ug via INTRAVENOUS
  Administered 2016-02-28 (×2): 80 ug via INTRAVENOUS

## 2016-02-28 MED ORDER — ONDANSETRON HCL 4 MG/2ML IJ SOLN
INTRAMUSCULAR | Status: DC | PRN
  Administered 2016-02-28: 4 mg via INTRAVENOUS

## 2016-02-28 MED ORDER — BUPIVACAINE HCL (PF) 0.25 % IJ SOLN
INTRAMUSCULAR | Status: AC
  Filled 2016-02-28: qty 30

## 2016-02-28 MED ORDER — BUPIVACAINE HCL (PF) 0.25 % IJ SOLN
INTRAMUSCULAR | Status: DC | PRN
  Administered 2016-02-28: 30 mL

## 2016-02-28 MED ORDER — SODIUM CHLORIDE 0.9 % IV SOLN: 250.0000 mL | INTRAVENOUS | Status: DC | PRN

## 2016-02-28 MED ORDER — EPHEDRINE SULFATE 50 MG/ML IJ SOLN
INTRAMUSCULAR | Status: DC | PRN
  Administered 2016-02-28 (×2): 5 mg via INTRAVENOUS

## 2016-02-28 MED ORDER — FENTANYL CITRATE (PF) 100 MCG/2ML IJ SOLN
INTRAMUSCULAR | Status: DC | PRN
  Administered 2016-02-28: 100 ug via INTRAVENOUS

## 2016-02-28 MED ORDER — HEPARIN (PORCINE) IN NACL 2-0.9 UNIT/ML-% IJ SOLN
INTRAMUSCULAR | Status: AC
  Filled 2016-02-28: qty 500

## 2016-02-28 MED ORDER — PROPOFOL 10 MG/ML IV BOLUS
INTRAVENOUS | Status: DC | PRN
  Administered 2016-02-28: 150 mg via INTRAVENOUS

## 2016-02-28 MED ORDER — SODIUM CHLORIDE 0.9% FLUSH: 3.0000 mL | INTRAVENOUS | Status: DC | PRN

## 2016-02-28 MED ORDER — HYDRALAZINE HCL 20 MG/ML IJ SOLN: 20.0000 mg | Freq: Once | INTRAMUSCULAR | Status: DC

## 2016-02-28 MED ORDER — ONDANSETRON HCL 4 MG/2ML IJ SOLN: 4.0000 mg | Freq: Four times a day (QID) | INTRAMUSCULAR | Status: DC | PRN

## 2016-02-28 MED ORDER — SODIUM CHLORIDE 0.9% FLUSH: 3.0000 mL | Freq: Two times a day (BID) | INTRAVENOUS | Status: DC

## 2016-02-28 MED ORDER — LIDOCAINE HCL (CARDIAC) 20 MG/ML IV SOLN
INTRAVENOUS | Status: DC | PRN
  Administered 2016-02-28: 100 mg via INTRAVENOUS

## 2016-02-28 MED ORDER — SODIUM CHLORIDE 0.9 % IV SOLN: INTRAVENOUS | Status: DC

## 2016-02-28 MED ORDER — ACETAMINOPHEN 325 MG PO TABS: 650.0000 mg | ORAL_TABLET | ORAL | Status: DC | PRN

## 2016-02-28 SURGICAL SUPPLY — 12 items
BAG SNAP BAND KOVER 36X36 (MISCELLANEOUS) ×3 IMPLANT
CATH BLAZERPRIME XP LG CV 10MM (ABLATOR) ×3 IMPLANT
CATH DUODECA HALO/ISMUS 7FR (CATHETERS) ×3 IMPLANT
CATH QUAD COURNAND 5FR (CATHETERS) ×3 IMPLANT
PACK EP LATEX FREE (CUSTOM PROCEDURE TRAY) ×2
PACK EP LF (CUSTOM PROCEDURE TRAY) ×1 IMPLANT
PAD DEFIB LIFELINK (PAD) ×3 IMPLANT
SHEATH ATRIAL FLUTTER SAFL 8F (SHEATH) ×3 IMPLANT
SHEATH PINNACLE 6F 10CM (SHEATH) ×3 IMPLANT
SHEATH PINNACLE 7F 10CM (SHEATH) ×3 IMPLANT
SHEATH PINNACLE 8F 10CM (SHEATH) ×6 IMPLANT
SHIELD RADPAD SCOOP 12X17 (MISCELLANEOUS) ×3 IMPLANT

## 2016-02-28 NOTE — Progress Notes (Signed)
Pt seen and examined    Groins ok Will ambulate and discharge this evening

## 2016-02-28 NOTE — Progress Notes (Addendum)
Site area: Left  Groin a 6 and 8 french venous sheath was removed  Site Prior to Removal:  Level 0  Pressure Applied For 15 MINUTES    Bedrest Beginning at 1240p  Manual:   Yes.    Patient Status During Pull:  stable  Post Pull Groin Site:  Level 0  Post Pull Instructions Given:  Yes.    Post Pull Pulses Present:  Yes.    Dressing Applied:  Yes.    Comments:  VS remain stable

## 2016-02-28 NOTE — Anesthesia Postprocedure Evaluation (Signed)
Anesthesia Post Note  Patient: Steven [name]  Procedure(s) Performed: Procedure(s) (LRB): A-Flutter Ablation (N/A)  Patient location during evaluation: PACU Anesthesia Type: MAC Level of consciousness: awake and alert Pain management: pain level controlled Vital Signs Assessment: post-procedure vital signs reviewed and stable Respiratory status: spontaneous breathing, nonlabored ventilation, respiratory function stable and patient connected to nasal cannula oxygen Cardiovascular status: stable and blood pressure returned to baseline Anesthetic complications: no    Last Vitals:  Vitals:   02/28/16 1245 02/28/16 1250  BP: (!) 192/64   Pulse: (!) 52 (!) 57  Resp: 14 17  Temp:      Last Pain:  Vitals:   02/28/16 0732  TempSrc: Oral                 Jayson Waterhouse DAVID

## 2016-02-28 NOTE — Progress Notes (Signed)
Pt with discharge order. Discharge instructions given. Groin site care provided & educated. Instructed not to take eliquis as per Dr. Caryl Comes. Hand outs & education about Ablation & A. Flutter provided. IV removed. Walked pt in the hallway without difficulty. V/S stable. No c/o chest pain or any discomfort. Discharged with daughter at bedside.

## 2016-02-28 NOTE — Discharge Instructions (Signed)
No driving for 1 week. No lifting over 5 lbs for 1 week. No vigorous or sexual activity for 1 week. You may return to work on 03/06/16. Keep procedure site clean & dry. If you notice increased pain, swelling, bleeding or pus, call/return!  You may shower, but no soaking baths/hot tubs/pools for 1 week.

## 2016-02-28 NOTE — Progress Notes (Addendum)
Site area: Right groin a 8 french venous sheath was removed  Site Prior to Removal:  Level 0  Pressure Applied For 15 MINUTES    Bedrest Beginning at 1240p  Manual:   Yes.    Patient Status During Pull:  stable  Post Pull Groin Site:  Level 0  Post Pull Instructions Given:  Yes.    Post Pull Pulses Present:  Yes.    Dressing Applied:  Yes.    Comments:  VS remain stable during sheath pull.

## 2016-02-28 NOTE — H&P (View-Only) (Signed)
Patient Care Team: Hulan Fess, MD as PCP - General (Family Medicine)   HPI  Steven [name] is a 81 y.o. male Seen in follow-up for atrial flutter.   He initially came to attention because of some dizziness and palpitations and fatigue. His PCP noted atrial flutter and referred for further consideration.  Retrospectively, he is not sure that he has had any significant attributable symptoms. He has no palpitations.  He's had a  hematologic issue with biopsies a couple years ago for persistent anemia he also has modest leukopenia this was unrevealing  Cardiac evaluation the past included an echocardiogram 2016 with normal LVEF. Echocardiogram 2015 had mild aortic stenosis with a peak gradient of 27 Catheterization 2002 was normal.  History of bilateral hand numbness prompted MRI imaging of his brain. This was associated with chronic ischemic lesions but no acute stroke  The original referral had been for consideration of catheter ablation. Because of the issue of the CNS events, the value of ablation in a patient who had no attributable symptoms came S clear. He is returning today for further discussion   Records and Results Reviewed* As above   Past Medical History:  Diagnosis Date  . Atrial flutter ([place])   . Carpal tunnel syndrome   . Cataract    left eye  . Enlarged prostate   . History of gout   . History of kidney stones    pt has one now but not giving him any problems  . Numbness    both hands pt states pinched. 12-17-14 Gabapentin has improved.  . Psoriasis     Past Surgical History:  Procedure Laterality Date  . CATARACT EXTRACTION W/PHACO Left 03/15/2013   Procedure: CATARACT EXTRACTION PHACO AND INTRAOCULAR LENS PLACEMENT (IOC);  Surgeon: Adonis Brook, MD;  Location: [place];  Service: Ophthalmology;  Laterality: Left;  . COLONOSCOPY    . COLONOSCOPY WITH PROPOFOL N/A 12/24/2014   Procedure: COLONOSCOPY WITH PROPOFOL;  Surgeon: Garlan Fair, MD;   [facility];  Service: Endoscopy;  Laterality: N/A;  . right cataract removed     . TONSILLECTOMY     as child    Current Outpatient Prescriptions  Medication Sig Dispense Refill  . ELIQUIS 5 MG TABS tablet Take 5 mg by mouth 2 (two) times daily.  12   No current facility-administered medications for this visit.     No Known Allergies    Review of Systems negative except from HPI and PMH  Physical Exam BP (!) 148/64   Pulse 70   Ht 6\' 1"  (1.854 m)   Wt 217 lb 3.2 oz (98.5 kg)   SpO2 97%   BMI 28.66 kg/m  Well developed and well nourished in no acute distress HENT normal E scleral and icterus clear Neck Supple JVP flat; carotids brisk and full Clear to ausculation  Regular rate and rhythm, no murmurs gallops or rub Soft with active bowel sounds No clubbing cyanosis  Edema Alert and oriented, grossly normal motor and sensory function Skin Warm and Dry    Assessment and  Plan  Atrial Flutter  MRI  >  bilateral chronic microvascular ischemia; I reviewed this with the radiologist today. These are inconsistent with thromboembolic events which are typically cortical   We have discussed proceeding w catheter ablation to reduce the risk of recurrence and hopefully be able to discontiue anticoagulation   Risk and benefits discussed    Current medicines are reviewed at length with  the patient today .  The patient does not  have concerns regarding medicines.

## 2016-02-28 NOTE — Interval H&P Note (Signed)
History and Physical Interval Note:  02/28/2016 8:09 AM  Steven [name]  has presented today for surgery, with the diagnosis of flutter  The various methods of treatment have been discussed with the patient and family. After consideration of risks, benefits and other options for treatment, the patient has consented to  Procedure(s): A-Flutter Ablation (N/A) as a surgical intervention .  The patient's history has been reviewed, patient examined, no change in status, stable for surgery.  I have reviewed the patient's chart and labs.  Questions were answered to the patient's satisfaction.     [name]

## 2016-02-28 NOTE — Anesthesia Preprocedure Evaluation (Addendum)
Anesthesia Evaluation  Patient identified by MRN, date of birth, ID band Patient awake    Reviewed: Allergy & Precautions, NPO status , Patient's Chart, lab work & pertinent test results  Airway Mallampati: II  TM Distance: >3 FB Neck ROM: Full    Dental  (+) Edentulous Upper, Edentulous Lower, Dental Advisory Given   Pulmonary former smoker,    Pulmonary exam normal        Cardiovascular Normal cardiovascular exam+ dysrhythmias Atrial Fibrillation      Neuro/Psych    GI/Hepatic   Endo/Other    Renal/GU      Musculoskeletal   Abdominal   Peds  Hematology   Anesthesia Other Findings   Reproductive/Obstetrics                           Anesthesia Physical Anesthesia Plan  ASA: III  Anesthesia Plan: MAC   Post-op Pain Management:    Induction: Intravenous  Airway Management Planned: Simple Face Mask and LMA  Additional Equipment:   Intra-op Plan:   Post-operative Plan:   Informed Consent: I have reviewed the patients History and Physical, chart, labs and discussed the procedure including the risks, benefits and alternatives for the proposed anesthesia with the patient or authorized representative who has indicated his/her understanding and acceptance.   Dental advisory given  Plan Discussed with: CRNA, Surgeon and Anesthesiologist  Anesthesia Plan Comments:       Anesthesia Quick Evaluation

## 2016-02-28 NOTE — Anesthesia Procedure Notes (Signed)
Procedure Name: LMA Insertion Date/Time: 02/28/2016 10:42 AM Performed by: Carney Living Pre-anesthesia Checklist: Patient identified, Emergency Drugs available, Suction available, Patient being monitored and Timeout performed Patient Re-evaluated:Patient Re-evaluated prior to inductionOxygen Delivery Method: Circle system utilized Preoxygenation: Pre-oxygenation with 100% oxygen Intubation Type: IV induction Ventilation: Mask ventilation without difficulty LMA Size: 5.0 Number of attempts: 1 Placement Confirmation: positive ETCO2 and breath sounds checked- equal and bilateral Tube secured with: Tape Dental Injury: Teeth and Oropharynx as per pre-operative assessment

## 2016-02-28 NOTE — Transfer of Care (Addendum)
Immediate Anesthesia Transfer of Care Note  Patient: Steven [name]  Procedure(s) Performed: Procedure(s): A-Flutter Ablation (N/A)  Patient Location: Cath Lab  Anesthesia Type:General  Level of Consciousness: awake, alert , oriented and patient cooperative  Airway & Oxygen Therapy: Patient Spontanous Breathing and Patient connected to nasal cannula oxygen  Post-op Assessment: Report given to RN, Post -op Vital signs reviewed and stable and Patient moving all extremities X 4  Post vital signs: Reviewed and stable  Last Vitals:  Vitals:   02/28/16 1245 02/28/16 1250  BP: (!) 192/64   Pulse: (!) 52 (!) 57  Resp: 14 17  Temp:      Last Pain:  Vitals:   02/28/16 0732  TempSrc: Oral      Patients Stated Pain Goal: 3 ([id number])  Complications: No apparent anesthesia complications

## 2016-02-28 NOTE — Care Management Note (Signed)
Case Management Note  Patient Details  Name: Steven [name] MRN: [id number] Date of Birth: 09/28/1934  Subjective/Objective:   Patient is s/p ablation, NCM will cont to follow for dc needs.                 Action/Plan:   Expected Discharge Date:                  Expected Discharge Plan:  Home/Self Care  In-House Referral:     Discharge planning Services     Post Acute Care Choice:    Choice offered to:     DME Arranged:    DME Agency:     HH Arranged:    HH Agency:     Status of Service:  In process, will continue to follow  If discussed at Long Length of Stay Meetings, dates discussed:    Additional Comments:  Zenon Mayo, RN 02/28/2016, 4:34 PM

## 2016-02-29 NOTE — Op Note (Signed)
NAMECORIN, CUZZORT                ACCOUNT NO.:  [id number]  MEDICAL RECORD NO.:  [id number]  LOCATION:  [id number]                        FACILITY:  [facility]  PHYSICIAN:  Deboraha Sprang, MD, FACCDATE OF BIRTH:  04-30-1935  DATE OF PROCEDURE:  02/28/2016 DATE OF DISCHARGE:  02/28/2016                              OPERATIVE REPORT   PREOPERATIVE DIAGNOSIS:  Atrial flutter.  POSTOPERATIVE DIAGNOSIS:  Atrial flutter.  PROCEDURE:  Invasive electrophysiological study with catheter ablation.  DESCRIPTION OF PROCEDURE:  Following obtaining informed consent, the patient was submitted for general anesthesia.  With adjunctive local anesthesia, cardiac catheterization was performed.  Following the procedure, the catheters were removed.  The patient was transferred to the holding area for sheath removal in stable condition.  CATHETERS: 1. A 5-French quadripolar catheter was inserted via the left femoral     vein to the AV junction. 2. A 7-French dual decapolar catheter was inserted via the left     femoral vein to the tricuspid annulus and the coronary sinus. 3. An 8-French 10 mm deflectable tip catheter was inserted via the     right femoral vein to mapping sites in the posterior septal space.  SURFACE LEADS:  1, AVF, and V1 were monitored continuously throughout the procedure.  Following insertion of the catheters, a stimulation protocol included incremental atrial pacing.  Incremental ventricular pacing.  Single atrial extrastimuli.  END-TIDAL RESULTS:  Baseline intervals - surface and intracardiac.  Rhythm sinus; RR interval 1051 milliseconds; PR interval 185 milliseconds; P-wave duration is 132 milliseconds; QRS duration was 102 milliseconds; QTc interval 400 milliseconds; AH interval 91 milliseconds; and HV interval 31 milliseconds.  Final: Rhythm:  Sinus; RR interval 793 milliseconds; PR interval 164 milliseconds; P-wave duration 125 milliseconds; QRS duration 107 milliseconds;  QT interval 403 milliseconds.  AH interval:  105 milliseconds; HV interval 42 milliseconds.  Bundle-branch block was absent.  AV NODAL FUNCTION:  AV Wenckebach was 360 milliseconds. VA Wenckebach was 310 milliseconds. AV nodal effective refractory period at 600 milliseconds was 320 milliseconds without evidence of discontinuity.  ACCESSORY PATHWAY FUNCTION:  No evidence of accessory pathway was identified.  VENTRICULAR RESPONSE PROGRAMMED STIMULATION:  Normal for ventricular stimulation as described.  Arrhythmias:  The patient presented to the lab in sinus rhythm.  Electrogram mapping demonstrated cavotricuspid isthmus conduction.  Catheter ablation across the cavotricuspid isthmus successfully interrupted conduction and gave rise to bidirectional isthmus conduction block eliminating the substrate for the patient's typical atrial flutter.  Radiofrequency energy.  A total 3.3 minutes of RF energy was applied across the cavotricuspid isthmus with elimination of atrial electrograms.  FLUOROSCOPY TIME:  A total of 3.6 minutes of fluoroscopy time was utilized.  CONCLUSION: 1. Sinus bradycardia, but normal sinus function. 2. Abnormal atrial function with prior history of typical atrial     flutter.  Successful ablation across the cavotricuspid isthmus,     interrupted bidirectional conduction gave rise to conduction block. 3. Normal AV nodal function. 4. Normal His-Purkinje system function. 5. No accessory pathway. 6. Normal ventricular response to programmed stimulation.  SUMMARY:  In conclusion, the results of electrophysiological testing confirmed cavotricuspid isthmus conduction likely mechanism for the patient's typical atrial flutter.  Conduction block across the cavotricuspid isthmus was accomplished with radial wave energy.  The patient tolerated the procedure without apparent complications.  ESTIMATED BLOOD LOSS:  Less than 50 mL.     Deboraha Sprang, MD,  [facility]     SCK/MEDQ  D:  02/28/2016  T:  02/29/2016  Job:  [id number]  cc:   Ezzard Standing, M.D.

## 2016-03-04 ENCOUNTER — Telehealth: Payer: Self-pay | Admitting: Internal Medicine

## 2016-03-04 NOTE — Telephone Encounter (Signed)
New message    Pt verbalized that he wants rn to call him because he wants to know when he can take the bandages off

## 2016-03-04 NOTE — Telephone Encounter (Signed)
Spoke with pt and advised him ok to remove bandages from ablation on 9/1.  Pt appreciative for assistance.

## 2016-03-20 ENCOUNTER — Encounter: Payer: Self-pay | Admitting: Internal Medicine

## 2016-03-30 ENCOUNTER — Ambulatory Visit: Payer: Commercial Managed Care - HMO | Admitting: Physician Assistant

## 2016-04-07 DIAGNOSIS — I10 Essential (primary) hypertension: Secondary | ICD-10-CM | POA: Diagnosis not present

## 2016-04-07 DIAGNOSIS — I491 Atrial premature depolarization: Secondary | ICD-10-CM | POA: Diagnosis not present

## 2016-04-07 DIAGNOSIS — I350 Nonrheumatic aortic (valve) stenosis: Secondary | ICD-10-CM | POA: Diagnosis not present

## 2016-04-07 DIAGNOSIS — E668 Other obesity: Secondary | ICD-10-CM | POA: Diagnosis not present

## 2016-04-07 DIAGNOSIS — I483 Typical atrial flutter: Secondary | ICD-10-CM | POA: Diagnosis not present

## 2016-04-13 NOTE — Progress Notes (Signed)
Cardiology Office Note Date:  04/14/2016  Patient ID:  Steven [name], DOB 1934-11-29, MRN [id number] PCP:  Gennette Pac, MD  Cardiologist:  Dr. Wynonia Lawman Electrophysiologist: Dr.Klein   Chief Complaint:  S/p Aflutter ablatioin  History of Present Illness: Steven [name] is a 81 y.o. male with history of PAFlutter, psoriasis, he has had a hematologic issue with biopsies a couple years ago for persistent anemia he also has modest leukopenia this was unrevealing, and history of bilateral hand numbness prompted MRI imaging of his brain. This was associated with chronic ischemic lesions but no acute stroke.   He comes today to be seen for Dr. Caryl Comes, now s/p AFlutter ablation by Dr. Caryl Comes on 02/28/16.  He reports feeling very well, denies any groin pain/complications post procedure.  He reports Dr. Caryl Comes instructed him to stop his Eliquis after the procedure and he did.  He denies any kind of symptoms, no CP, palpitations or SOB, no dizziness, near syncope or syncope.  He saw Dr. Wynonia Lawman las week and reports being told everything was stable planned for routine f/u in 1 year.  Dr. Olin Pia not reports: "Cardiac evaluation the past included an echocardiogram 2016 with normal LVEF. Echocardiogram 2015 had mild aortic stenosis with a peak gradient of 27 Catheterization 2002 was normal."   Past Medical History:  Diagnosis Date  . Atrial flutter ([place])   . Carpal tunnel syndrome   . Cataract    left eye  . Enlarged prostate   . History of gout   . History of kidney stones    pt has one now but not giving him any problems  . Numbness    both hands pt states pinched. 12-17-14 Gabapentin has improved.  . Pneumonia    history of pneumonia  . Psoriasis     Past Surgical History:  Procedure Laterality Date  . CATARACT EXTRACTION W/PHACO Left 03/15/2013   Procedure: CATARACT EXTRACTION PHACO AND INTRAOCULAR LENS PLACEMENT (IOC);  Surgeon: Adonis Brook, MD;  Location: [facility];  Service:  Ophthalmology;  Laterality: Left;  . COLONOSCOPY    . COLONOSCOPY WITH PROPOFOL N/A 12/24/2014   Procedure: COLONOSCOPY WITH PROPOFOL;  Surgeon: Garlan Fair, MD;  Location: [facility];  Service: Endoscopy;  Laterality: N/A;  . ELECTROPHYSIOLOGIC STUDY N/A 02/28/2016   Procedure: A-Flutter Ablation;  Surgeon: Deboraha Sprang, MD;  Location: [facility];  Service: Cardiovascular;  Laterality: N/A;  . right cataract removed     . TONSILLECTOMY     as child    Current Outpatient Prescriptions  Medication Sig Dispense Refill  . castor oil liquid Take 10 mLs by mouth daily as needed for moderate constipation.     No current facility-administered medications for this visit.     Allergies:   Review of patient's allergies indicates no known allergies.   Social History:  The patient  reports that he has quit smoking. His smoking use included Cigars. He has never used smokeless tobacco. He reports that he does not drink alcohol or use drugs.   Family History:  The patient's family history includes Healthy in his father; Other in his mother.  ROS:  Please see the history of present illness.  All other systems are reviewed and otherwise negative.   PHYSICAL EXAM:  VS:  BP (!) 142/66   Pulse 70   Ht 6\' 1"  (1.854 m)   Wt 220 lb (99.8 kg)   BMI 29.03 kg/m  BMI: Body mass index is 29.03 kg/m. Well  nourished, well developed, in no acute distress  HEENT: normocephalic, atraumatic  Neck: no JVD, carotid bruits or masses Cardiac: RRR  2/6 SM, no rubs, or gallops Lungs:  clear to auscultation bilaterally, no wheezing, rhonchi or rales  Abd: soft, nontender MS: no deformity or atrophy Ext: no edema  Skin: warm and dry, no rash Neuro:  No gross deficits appreciated Psych: euthymic mood, full affect   procedure site stable without complication   EKG:  Done today shows SR, nonspecific T changes  02/28/16: AFlutter Ablation SUMMARY:  In conclusion, the results of  electrophysiological testing confirmed cavotricuspid isthmus conduction likely mechanism for the patient's typical atrial flutter.  Conduction block across the cavotricuspid isthmus was accomplished with radial wave energy.  The patient tolerated the procedure without apparent complications  Recent Labs: 11/15/2015: B Natriuretic Peptide 32.5 02/26/2016: BUN 14; Creat 1.05; Hemoglobin 12.3; Platelets 199; Potassium 4.4; Sodium 140  No results found for requested labs within last 8760 hours.   CrCl cannot be calculated (Patient's most recent lab result is older than the maximum 21 days allowed.).   Wt Readings from Last 3 Encounters:  04/14/16 220 lb (99.8 kg)  02/28/16 217 lb (98.4 kg)  02/26/16 217 lb 6.4 oz (98.6 kg)     Other studies reviewed: Additional studies/records reviewed today include: summarized above  ASSESSMENT AND PLAN:  1. PAFlutter     S/p AFlutter ablation 02/28/16     No procedural complication     Pt reports he has already stopped his Eliquis  2. SM on exam     Known mild AS by echo in 2015     Follows with Dr. Wynonia Lawman     No symptoms  Disposition: F/u with Dr. Wynonia Lawman as instructed by him, here PRN.  Current medicines are reviewed at length with the patient today.  The patient did not have any concerns regarding medicines.  Steven Lasso, PA-C 04/14/2016 10:31 AM     [facility] [place] [address] [phone] (office)  [phone] (fax)

## 2016-04-14 ENCOUNTER — Encounter (INDEPENDENT_AMBULATORY_CARE_PROVIDER_SITE_OTHER): Payer: Self-pay

## 2016-04-14 ENCOUNTER — Ambulatory Visit (INDEPENDENT_AMBULATORY_CARE_PROVIDER_SITE_OTHER): Payer: Commercial Managed Care - HMO | Admitting: Physician Assistant

## 2016-04-14 VITALS — BP 142/66 | HR 70 | Ht 73.0 in | Wt 220.0 lb

## 2016-04-14 DIAGNOSIS — I4892 Unspecified atrial flutter: Secondary | ICD-10-CM

## 2016-04-14 NOTE — Patient Instructions (Signed)
Medication Instructions:   Your physician recommends that you continue on your current medications as directed. Please refer to the Current Medication list given to you today.   If you need a refill on your cardiac medications before your next appointment, please call your pharmacy.  Labwork: NONE ORDER TODAY   Testing/Procedures: NONE ORDER TODAY    Follow-Up: CONTACT [facility] [phone] AS NEEDED FOR  ANY CARDIAC RELATED SYMPTOMS    Any Other Special Instructions Will Be Listed Below (If Applicable).

## 2016-06-17 DIAGNOSIS — Z23 Encounter for immunization: Secondary | ICD-10-CM | POA: Diagnosis not present

## 2016-06-17 DIAGNOSIS — J029 Acute pharyngitis, unspecified: Secondary | ICD-10-CM | POA: Diagnosis not present

## 2016-06-26 DIAGNOSIS — H04123 Dry eye syndrome of bilateral lacrimal glands: Secondary | ICD-10-CM | POA: Diagnosis not present

## 2016-06-26 DIAGNOSIS — H43813 Vitreous degeneration, bilateral: Secondary | ICD-10-CM | POA: Diagnosis not present

## 2016-06-26 DIAGNOSIS — H40023 Open angle with borderline findings, high risk, bilateral: Secondary | ICD-10-CM | POA: Diagnosis not present

## 2016-08-07 ENCOUNTER — Encounter (INDEPENDENT_AMBULATORY_CARE_PROVIDER_SITE_OTHER): Payer: Medicare HMO | Admitting: Ophthalmology

## 2016-08-07 DIAGNOSIS — H43813 Vitreous degeneration, bilateral: Secondary | ICD-10-CM | POA: Diagnosis not present

## 2016-08-07 DIAGNOSIS — H26492 Other secondary cataract, left eye: Secondary | ICD-10-CM | POA: Diagnosis not present

## 2016-08-07 DIAGNOSIS — H353121 Nonexudative age-related macular degeneration, left eye, early dry stage: Secondary | ICD-10-CM | POA: Diagnosis not present

## 2016-08-16 ENCOUNTER — Encounter (HOSPITAL_COMMUNITY): Payer: Self-pay

## 2016-08-16 ENCOUNTER — Emergency Department (HOSPITAL_COMMUNITY): Payer: Medicare HMO

## 2016-08-16 ENCOUNTER — Emergency Department (HOSPITAL_COMMUNITY)
Admission: EM | Admit: 2016-08-16 | Discharge: 2016-08-16 | Disposition: A | Discharge status: Home / Self Care | Payer: Medicare HMO | Attending: Emergency Medicine | Admitting: Emergency Medicine

## 2016-08-16 DIAGNOSIS — Z79899 Other long term (current) drug therapy: Secondary | ICD-10-CM | POA: Insufficient documentation

## 2016-08-16 DIAGNOSIS — R531 Weakness: Secondary | ICD-10-CM | POA: Diagnosis not present

## 2016-08-16 DIAGNOSIS — R509 Fever, unspecified: Secondary | ICD-10-CM | POA: Diagnosis present

## 2016-08-16 DIAGNOSIS — N3001 Acute cystitis with hematuria: Secondary | ICD-10-CM | POA: Insufficient documentation

## 2016-08-16 DIAGNOSIS — Z87891 Personal history of nicotine dependence: Secondary | ICD-10-CM | POA: Diagnosis not present

## 2016-08-16 LAB — URINALYSIS, ROUTINE W REFLEX MICROSCOPIC
Bilirubin Urine: NEGATIVE
Glucose, UA: NEGATIVE mg/dL
Ketones, ur: NEGATIVE mg/dL
Nitrite: POSITIVE — AB
PROTEIN: 30 mg/dL — AB
SPECIFIC GRAVITY, URINE: 1.016 (ref 1.005–1.030)
pH: 5 (ref 5.0–8.0)

## 2016-08-16 LAB — BASIC METABOLIC PANEL
ANION GAP: 7 (ref 5–15)
BUN: 19 mg/dL (ref 6–20)
CO2: 27 mmol/L (ref 22–32)
Calcium: 9.4 mg/dL (ref 8.9–10.3)
Chloride: 102 mmol/L (ref 101–111)
Creatinine, Ser: 1.37 mg/dL — ABNORMAL HIGH (ref 0.61–1.24)
GFR calc Af Amer: 55 mL/min — ABNORMAL LOW (ref >60)
GFR calc non Af Amer: 47 mL/min — ABNORMAL LOW (ref >60)
GLUCOSE: 129 mg/dL — AB (ref 65–99)
POTASSIUM: 4.4 mmol/L (ref 3.5–5.1)
Sodium: 136 mmol/L (ref 135–145)

## 2016-08-16 LAB — CBC
HEMATOCRIT: 36.8 % — AB (ref 39.0–52.0)
HEMOGLOBIN: 12.6 g/dL — AB (ref 13.0–17.0)
MCH: 29.4 pg (ref 26.0–34.0)
MCHC: 34.2 g/dL (ref 30.0–36.0)
MCV: 86 fL (ref 78.0–100.0)
Platelets: 186 10*3/uL (ref 150–400)
RBC: 4.28 MIL/uL (ref 4.22–5.81)
RDW: 13.6 % (ref 11.5–15.5)
WBC: 9.6 10*3/uL (ref 4.0–10.5)

## 2016-08-16 LAB — CBG MONITORING, ED: GLUCOSE-CAPILLARY: 119 mg/dL — AB (ref 65–99)

## 2016-08-16 MED ORDER — CIPROFLOXACIN HCL 500 MG PO TABS: 500.0000 mg | ORAL_TABLET | Freq: Two times a day (BID) | ORAL | 0 refills | Status: DC

## 2016-08-16 MED ORDER — ACETAMINOPHEN 325 MG PO TABS
650.0000 mg | ORAL_TABLET | Freq: Once | ORAL | Status: AC | PRN
  Administered 2016-08-16: 650 mg via ORAL
  Filled 2016-08-16: qty 2

## 2016-08-16 NOTE — ED Notes (Signed)
Patient given urinal and family made aware of pending urine sample.

## 2016-08-16 NOTE — ED Notes (Signed)
Patient transported to X-ray 

## 2016-08-16 NOTE — ED Triage Notes (Signed)
Patient reports patient reports that he began having chills, weakness, and not feeling himself  Yesterday. Patient denies any pain

## 2016-08-16 NOTE — Discharge Instructions (Signed)
Urology follow up for male with UTI

## 2016-08-16 NOTE — ED Provider Notes (Signed)
[facility] DEPT Provider Note   CSN: [id number] Arrival date & time: 08/16/16  0854     History   Chief Complaint Chief Complaint  Patient presents with  . Chills  . Fatigue  . Fever    HPI Steven [name] is a 82 y.o. male.  82 yo M with a chief complaint of chills and fever and weakness. Going on for the past day. Denies other symptoms. Denies congestion denies cough denies abdominal pain vomiting diarrhea. He does have some difficulty with urination. Unsure of sick contacts.   The history is provided by the patient and a relative.  Fever   Pertinent negatives include no chest pain, no diarrhea, no vomiting, no congestion and no headaches.  Illness  This is a new problem. The current episode started 6 to 12 hours ago. The problem occurs constantly. The problem has not changed since onset.Pertinent negatives include no chest pain, no abdominal pain, no headaches and no shortness of breath. Nothing aggravates the symptoms. Nothing relieves the symptoms. He has tried nothing for the symptoms. The treatment provided no relief.    Past Medical History:  Diagnosis Date  . Atrial flutter ([place])   . Carpal tunnel syndrome   . Cataract    left eye  . Enlarged prostate   . History of gout   . History of kidney stones    pt has one now but not giving him any problems  . Numbness    both hands pt states pinched. 12-17-14 Gabapentin has improved.  . Pneumonia    history of pneumonia  . Psoriasis     Patient Active Problem List   Diagnosis Date Noted  . Atrial flutter ([place]) 02/28/2016  . Leukocytopenia 03/11/2015  . Deficiency anemia 02/04/2015  . Olecranon bursitis of right elbow 11/24/2013  . Community acquired pneumonia 11/24/2013  . Murmur 11/24/2013    Past Surgical History:  Procedure Laterality Date  . CATARACT EXTRACTION W/PHACO Left 03/15/2013   Procedure: CATARACT EXTRACTION PHACO AND INTRAOCULAR LENS PLACEMENT (IOC);  Surgeon: Adonis Brook, MD;  Location: [facility];  Service: Ophthalmology;  Laterality: Left;  . COLONOSCOPY    . COLONOSCOPY WITH PROPOFOL N/A 12/24/2014   Procedure: COLONOSCOPY WITH PROPOFOL;  Surgeon: Garlan Fair, MD;  Location: [facility];  Service: Endoscopy;  Laterality: N/A;  . ELECTROPHYSIOLOGIC STUDY N/A 02/28/2016   Procedure: A-Flutter Ablation;  Surgeon: Deboraha Sprang, MD;  Location: [facility];  Service: Cardiovascular;  Laterality: N/A;  . right cataract removed     . TONSILLECTOMY     as child       Home Medications    Prior to Admission medications   Medication Sig Start Date End Date Taking? Authorizing Provider  castor oil liquid Take 10 mLs by mouth daily as needed for moderate constipation.    Historical Provider, MD  ciprofloxacin (CIPRO) 500 MG tablet Take 1 tablet (500 mg total) by mouth 2 (two) times daily. One po bid x 7 days 08/16/16   Deno Etienne, DO    Family History Family History  Problem Relation Age of Onset  . Other Mother     Psychologist, forensic  . Healthy Father     Social History Social History  Substance Use Topics  . Smoking status: Former Smoker    Types: Cigars  . Smokeless tobacco: Never Used     Comment: smoked cigars 76yrs ago  . Alcohol use No     Allergies   Patient has  no known allergies.   Review of Systems Review of Systems  Constitutional: Positive for chills and fever.  HENT: Negative for congestion and facial swelling.   Eyes: Negative for discharge and visual disturbance.  Respiratory: Negative for shortness of breath.   Cardiovascular: Negative for chest pain and palpitations.  Gastrointestinal: Negative for abdominal pain, diarrhea and vomiting.  Genitourinary: Positive for difficulty urinating.  Musculoskeletal: Negative for arthralgias and myalgias.  Skin: Negative for color change and rash.  Neurological: Negative for tremors, syncope and headaches.  Psychiatric/Behavioral: Negative for confusion and dysphoric mood.     Physical Exam Updated  Vital Signs BP 152/74   Pulse 89   Temp 99.4 F (37.4 C) (Oral)   Resp 21   Ht 6\' 2"  (1.88 m)   Wt 225 lb (102.1 kg)   SpO2 98%   BMI 28.89 kg/m   Physical Exam  Constitutional: He is oriented to person, place, and time. He appears well-developed and well-nourished.  HENT:  Head: Normocephalic and atraumatic.  Swollen turbinates, posterior nasal drip, no noted sinus ttp, tm normal bilaterally.    Eyes: EOM are normal. Pupils are equal, round, and reactive to light.  Neck: Normal range of motion. Neck supple. No JVD present.  Cardiovascular: Normal rate and regular rhythm.  Exam reveals no gallop and no friction rub.   No murmur heard. Pulmonary/Chest: No respiratory distress. He has no wheezes.  Abdominal: He exhibits no distension and no mass. There is no tenderness. There is no rebound and no guarding.  Musculoskeletal: Normal range of motion.  Neurological: He is alert and oriented to person, place, and time.  Skin: No rash noted. No pallor.  Psychiatric: He has a normal mood and affect. His behavior is normal.  Nursing note and vitals reviewed.    ED Treatments / Results  Labs (all labs ordered are listed, but only abnormal results are displayed) Labs Reviewed  BASIC METABOLIC PANEL - Abnormal; Notable for the following:       Result Value   Glucose, Bld 129 (*)    Creatinine, Ser 1.37 (*)    GFR calc non Af Amer 47 (*)    GFR calc Af Amer 55 (*)    All other components within normal limits  CBC - Abnormal; Notable for the following:    Hemoglobin 12.6 (*)    HCT 36.8 (*)    All other components within normal limits  URINALYSIS, ROUTINE W REFLEX MICROSCOPIC - Abnormal; Notable for the following:    APPearance CLOUDY (*)    Hgb urine dipstick MODERATE (*)    Protein, ur 30 (*)    Nitrite POSITIVE (*)    Leukocytes, UA LARGE (*)    Bacteria, UA MANY (*)    Squamous Epithelial / LPF 0-5 (*)    All other components within normal limits  CBG MONITORING, ED -  Abnormal; Notable for the following:    Glucose-Capillary 119 (*)    All other components within normal limits    EKG  EKG Interpretation  Date/Time:  Sunday August 16 2016 09:14:32 EST Ventricular Rate:  92 PR Interval:    QRS Duration: 93 QT Interval:  338 QTC Calculation: 419 R Axis:   -17 Text Interpretation:  Sinus rhythm Borderline left axis deviation RSR' in V1 or V2, right VCD or RVH Borderline ST elevation, lateral leads Baseline wander in lead(s) II III aVF V6 No significant change since last tracing Confirmed by Gaven Eugene MD, DANIEL [id number]) on 08/16/2016 10:05:50 AM  Radiology Dg Chest 2 View  Result Date: 08/16/2016 CLINICAL DATA:  Chills and weakness EXAM: CHEST  2 VIEW COMPARISON:  Nov 15, 2015 FINDINGS: There is no edema or consolidation. Heart size and pulmonary vascularity are normal. No adenopathy. There is atherosclerotic calcification in the aorta. No bone lesions. IMPRESSION: Aortic atherosclerosis.  No edema or consolidation. Electronically Signed   By: Lowella Grip III M.D.   On: 08/16/2016 09:48    Procedures Procedures (including critical care time)  Medications Ordered in ED Medications  acetaminophen (TYLENOL) tablet 650 mg (650 mg Oral Given 08/16/16 [id number])     Initial Impression / Assessment and Plan / ED Course  I have reviewed the triage vital signs and the nursing notes.  Pertinent labs & imaging results that were available during my care of the patient were reviewed by me and considered in my medical decision making (see chart for details).     82 yo M With a chief complaint of fever.  Workup with likely urinary tract infection. Started on Cipro. Urology follow-up.  3:30 PM:  I have discussed the diagnosis/risks/treatment options with the patient and family and believe the pt to be eligible for discharge home to follow-up with Urology. We also discussed returning to the ED immediately if new or worsening sx occur. We discussed the sx  which are most concerning (e.g., sudden worsening pain, fever, inability to tolerate by mouth) that necessitate immediate return. Medications administered to the patient during their visit and any new prescriptions provided to the patient are listed below.  Medications given during this visit Medications  acetaminophen (TYLENOL) tablet 650 mg (650 mg Oral Given 08/16/16 [id number])     The patient appears reasonably screen and/or stabilized for discharge and I doubt any other medical condition or other [facility] requiring further screening, evaluation, or treatment in the ED at this time prior to discharge.    Final Clinical Impressions(s) / ED Diagnoses   Final diagnoses:  Acute cystitis with hematuria    New Prescriptions Discharge Medication List as of 08/16/2016 12:03 PM    START taking these medications   Details  ciprofloxacin (CIPRO) 500 MG tablet Take 1 tablet (500 mg total) by mouth 2 (two) times daily. One po bid x 7 days, Starting Sun 08/16/2016, [place], DO 08/16/16 1530

## 2016-08-21 DIAGNOSIS — N3000 Acute cystitis without hematuria: Secondary | ICD-10-CM | POA: Diagnosis not present

## 2016-08-21 DIAGNOSIS — N400 Enlarged prostate without lower urinary tract symptoms: Secondary | ICD-10-CM | POA: Diagnosis not present

## 2016-09-03 ENCOUNTER — Encounter (INDEPENDENT_AMBULATORY_CARE_PROVIDER_SITE_OTHER): Payer: Medicare HMO | Admitting: Ophthalmology

## 2016-09-03 DIAGNOSIS — H2702 Aphakia, left eye: Secondary | ICD-10-CM

## 2017-02-22 DIAGNOSIS — N400 Enlarged prostate without lower urinary tract symptoms: Secondary | ICD-10-CM | POA: Diagnosis not present

## 2017-02-22 DIAGNOSIS — N281 Cyst of kidney, acquired: Secondary | ICD-10-CM | POA: Diagnosis not present

## 2017-02-22 DIAGNOSIS — N5201 Erectile dysfunction due to arterial insufficiency: Secondary | ICD-10-CM | POA: Diagnosis not present

## 2017-04-09 DIAGNOSIS — I483 Typical atrial flutter: Secondary | ICD-10-CM | POA: Diagnosis not present

## 2017-04-09 DIAGNOSIS — I359 Nonrheumatic aortic valve disorder, unspecified: Secondary | ICD-10-CM | POA: Diagnosis not present

## 2017-04-09 DIAGNOSIS — I491 Atrial premature depolarization: Secondary | ICD-10-CM | POA: Diagnosis not present

## 2017-04-09 DIAGNOSIS — I119 Hypertensive heart disease without heart failure: Secondary | ICD-10-CM | POA: Diagnosis not present

## 2017-04-09 DIAGNOSIS — I350 Nonrheumatic aortic (valve) stenosis: Secondary | ICD-10-CM | POA: Diagnosis not present

## 2017-04-22 DIAGNOSIS — E669 Obesity, unspecified: Secondary | ICD-10-CM | POA: Diagnosis not present

## 2017-04-22 DIAGNOSIS — Z6830 Body mass index (BMI) 30.0-30.9, adult: Secondary | ICD-10-CM | POA: Diagnosis not present

## 2017-04-22 DIAGNOSIS — Z23 Encounter for immunization: Secondary | ICD-10-CM | POA: Diagnosis not present

## 2017-04-22 DIAGNOSIS — J069 Acute upper respiratory infection, unspecified: Secondary | ICD-10-CM | POA: Diagnosis not present

## 2017-04-29 IMAGING — CR DG CHEST 2V
2 series · 2 of 2 positions shown · non-contrast
Comparison: 12/08/2013

CLINICAL DATA: Shortness of breath and atrial flutter.

EXAM:
CHEST  2 VIEW

[chest pa]
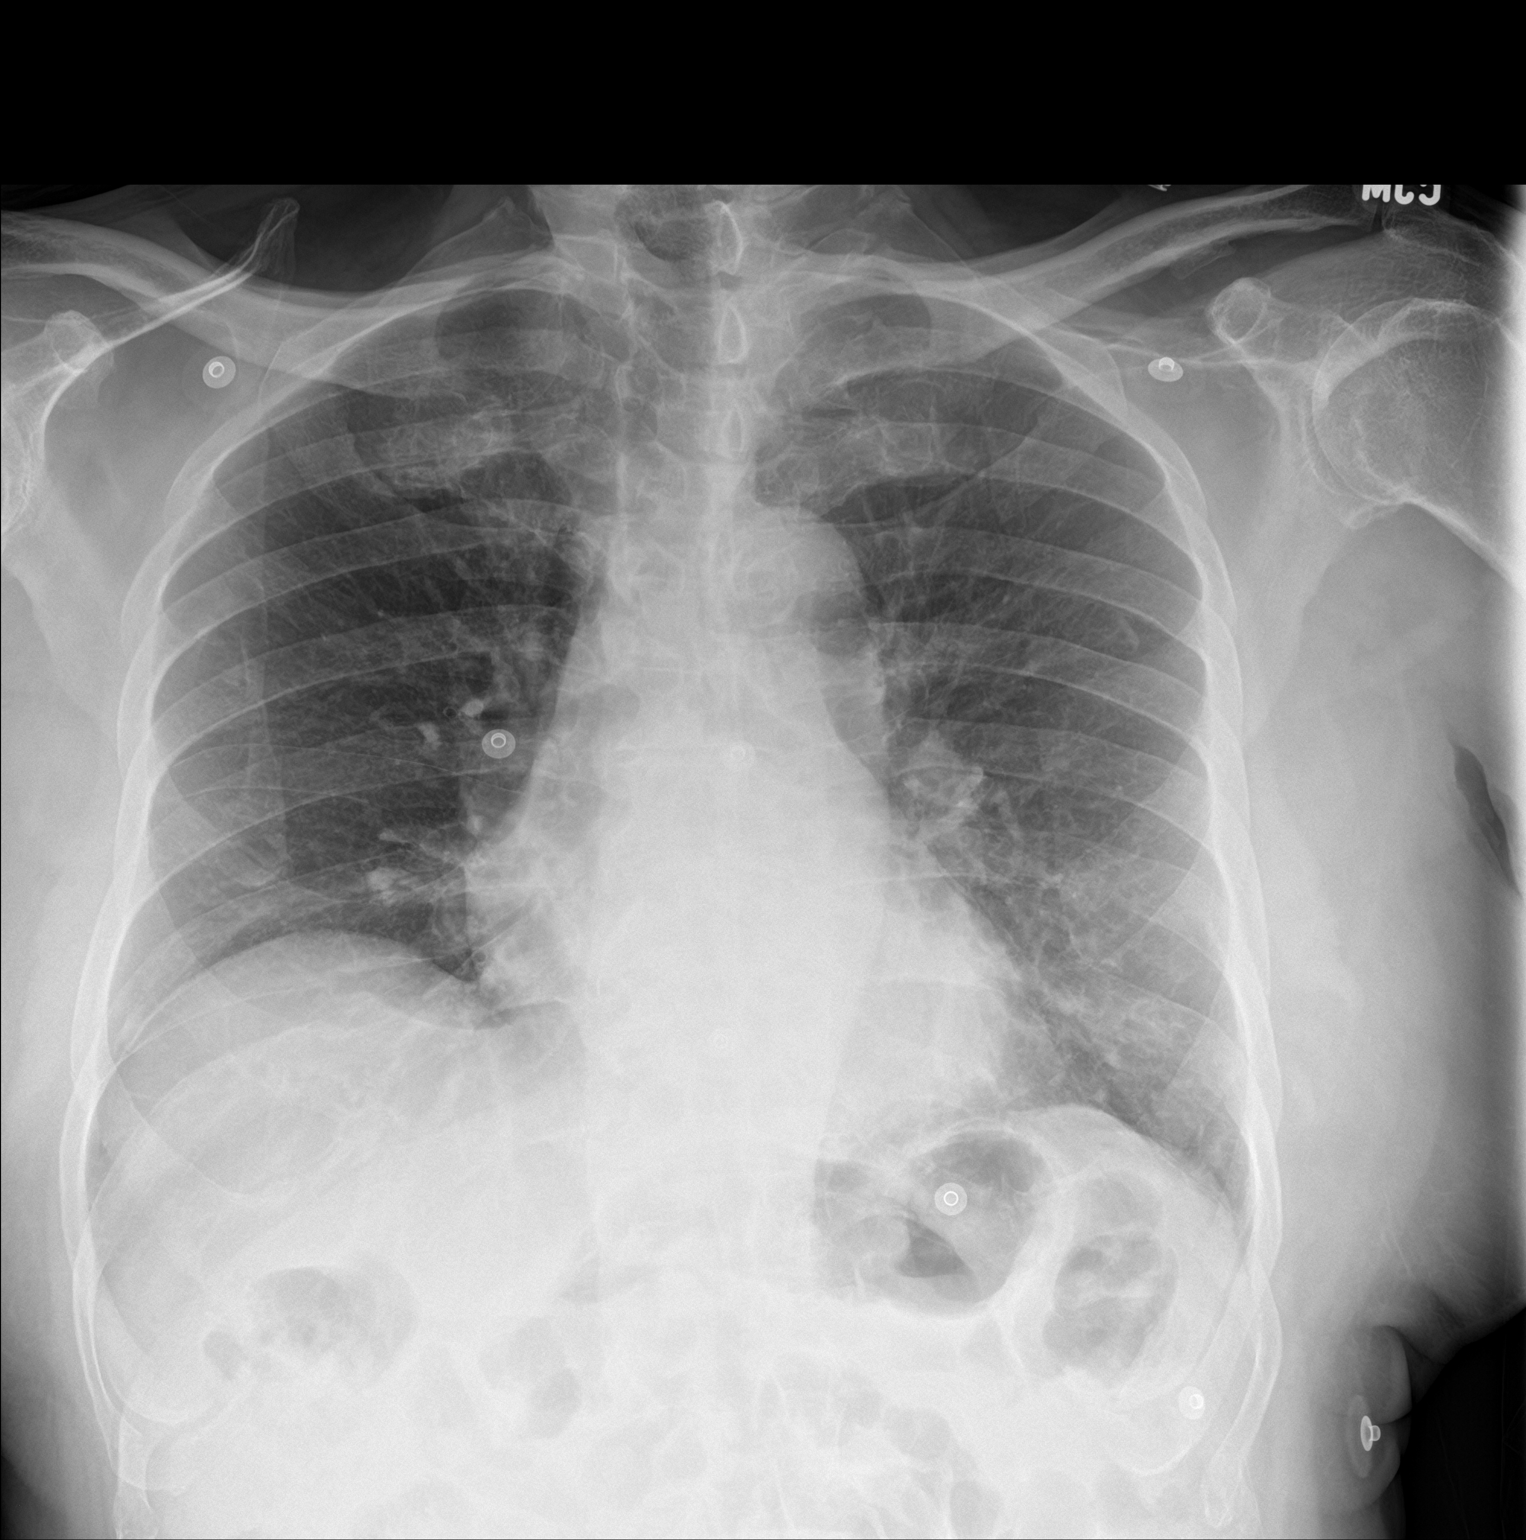

[chest lat]
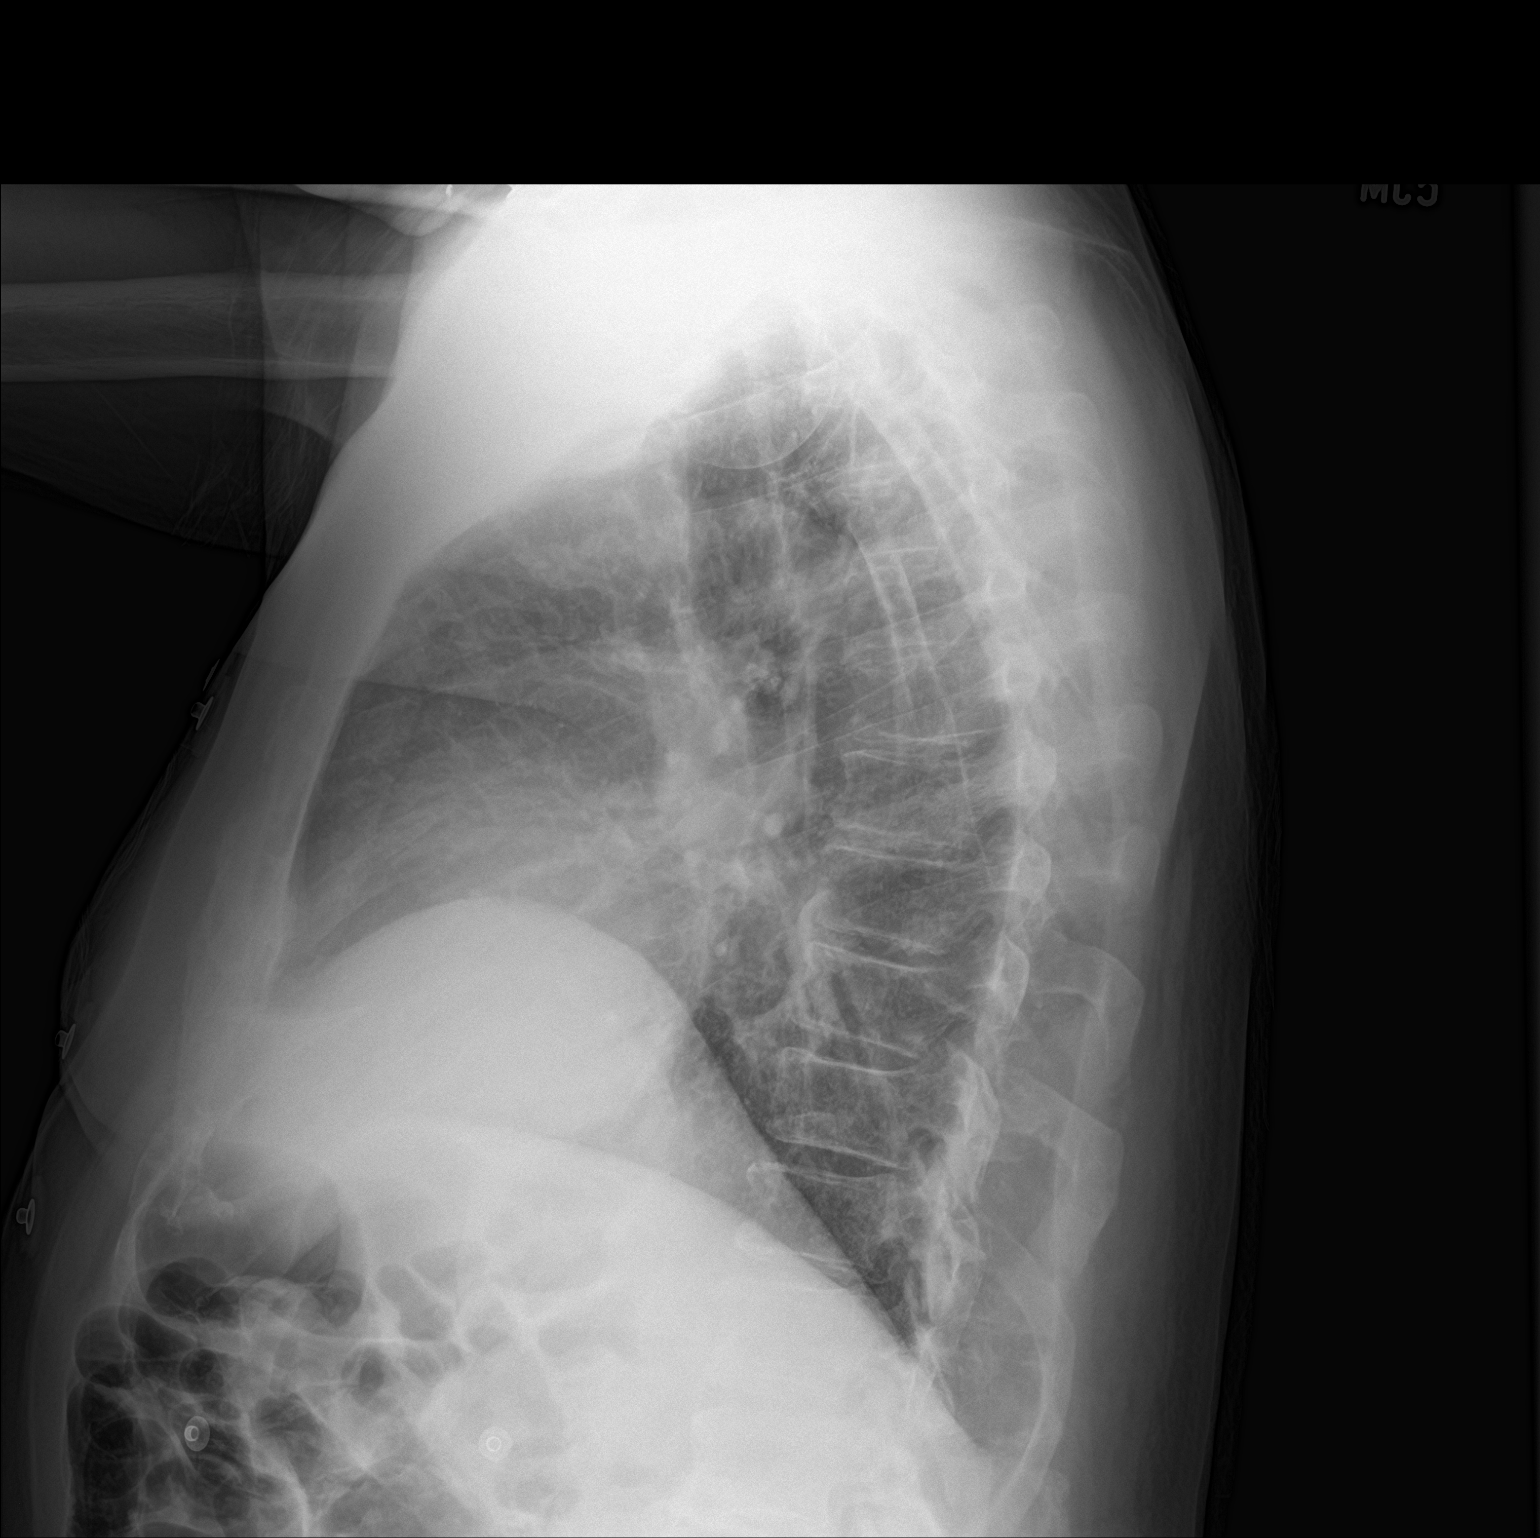

[2 of 2 positions shown; findings below may reference images not displayed]

FINDINGS: Heart and mediastinum are within normal limits. The lungs are clear
without airspace disease or pulmonary edema. Few prominent lung
markings in the left lower chest appear unchanged. Mild elevation of
right hemidiaphragm is unchanged. No large pleural effusions.
Degenerative changes in the lower thoracic spine.
IMPRESSION: No active cardiopulmonary disease.

## 2017-06-25 DIAGNOSIS — H40013 Open angle with borderline findings, low risk, bilateral: Secondary | ICD-10-CM | POA: Diagnosis not present

## 2017-06-25 DIAGNOSIS — H57813 Brow ptosis, bilateral: Secondary | ICD-10-CM | POA: Diagnosis not present

## 2017-06-30 DIAGNOSIS — E669 Obesity, unspecified: Secondary | ICD-10-CM | POA: Diagnosis not present

## 2017-06-30 DIAGNOSIS — I350 Nonrheumatic aortic (valve) stenosis: Secondary | ICD-10-CM | POA: Diagnosis not present

## 2017-06-30 DIAGNOSIS — Z23 Encounter for immunization: Secondary | ICD-10-CM | POA: Diagnosis not present

## 2017-06-30 DIAGNOSIS — Z0000 Encounter for general adult medical examination without abnormal findings: Secondary | ICD-10-CM | POA: Diagnosis not present

## 2017-06-30 DIAGNOSIS — N401 Enlarged prostate with lower urinary tract symptoms: Secondary | ICD-10-CM | POA: Diagnosis not present

## 2017-06-30 DIAGNOSIS — Z6830 Body mass index (BMI) 30.0-30.9, adult: Secondary | ICD-10-CM | POA: Diagnosis not present

## 2017-06-30 DIAGNOSIS — I4892 Unspecified atrial flutter: Secondary | ICD-10-CM | POA: Diagnosis not present

## 2017-06-30 DIAGNOSIS — I10 Essential (primary) hypertension: Secondary | ICD-10-CM | POA: Diagnosis not present

## 2017-06-30 DIAGNOSIS — Z8739 Personal history of other diseases of the musculoskeletal system and connective tissue: Secondary | ICD-10-CM | POA: Diagnosis not present

## 2017-09-09 DIAGNOSIS — M545 Low back pain: Secondary | ICD-10-CM | POA: Diagnosis not present

## 2017-09-09 DIAGNOSIS — I10 Essential (primary) hypertension: Secondary | ICD-10-CM | POA: Diagnosis not present

## 2017-09-27 DIAGNOSIS — D649 Anemia, unspecified: Secondary | ICD-10-CM | POA: Diagnosis not present

## 2017-12-23 DIAGNOSIS — H40013 Open angle with borderline findings, low risk, bilateral: Secondary | ICD-10-CM | POA: Diagnosis not present

## 2017-12-23 DIAGNOSIS — H43813 Vitreous degeneration, bilateral: Secondary | ICD-10-CM | POA: Diagnosis not present

## 2018-01-10 ENCOUNTER — Other Ambulatory Visit: Payer: Self-pay

## 2018-01-10 ENCOUNTER — Emergency Department (HOSPITAL_COMMUNITY): Payer: Medicare HMO

## 2018-01-10 ENCOUNTER — Observation Stay (HOSPITAL_COMMUNITY)
Admission: EM | Admit: 2018-01-10 | Discharge: 2018-01-11 | Disposition: A | Discharge status: Home / Self Care | Payer: Medicare HMO | Attending: Internal Medicine | Admitting: Internal Medicine

## 2018-01-10 ENCOUNTER — Encounter (HOSPITAL_COMMUNITY): Payer: Self-pay | Admitting: Emergency Medicine

## 2018-01-10 DIAGNOSIS — I4891 Unspecified atrial fibrillation: Secondary | ICD-10-CM | POA: Insufficient documentation

## 2018-01-10 DIAGNOSIS — R011 Cardiac murmur, unspecified: Secondary | ICD-10-CM | POA: Insufficient documentation

## 2018-01-10 DIAGNOSIS — R079 Chest pain, unspecified: Secondary | ICD-10-CM | POA: Diagnosis not present

## 2018-01-10 DIAGNOSIS — I209 Angina pectoris, unspecified: Secondary | ICD-10-CM | POA: Diagnosis present

## 2018-01-10 DIAGNOSIS — Z7982 Long term (current) use of aspirin: Secondary | ICD-10-CM | POA: Insufficient documentation

## 2018-01-10 DIAGNOSIS — N183 Chronic kidney disease, stage 3 unspecified: Secondary | ICD-10-CM | POA: Diagnosis present

## 2018-01-10 DIAGNOSIS — M109 Gout, unspecified: Secondary | ICD-10-CM | POA: Insufficient documentation

## 2018-01-10 DIAGNOSIS — R0789 Other chest pain: Secondary | ICD-10-CM | POA: Diagnosis not present

## 2018-01-10 DIAGNOSIS — Z9842 Cataract extraction status, left eye: Secondary | ICD-10-CM | POA: Diagnosis not present

## 2018-01-10 DIAGNOSIS — N400 Enlarged prostate without lower urinary tract symptoms: Secondary | ICD-10-CM | POA: Insufficient documentation

## 2018-01-10 DIAGNOSIS — Z87442 Personal history of urinary calculi: Secondary | ICD-10-CM | POA: Diagnosis not present

## 2018-01-10 DIAGNOSIS — Z87891 Personal history of nicotine dependence: Secondary | ICD-10-CM | POA: Insufficient documentation

## 2018-01-10 DIAGNOSIS — Z961 Presence of intraocular lens: Secondary | ICD-10-CM | POA: Insufficient documentation

## 2018-01-10 DIAGNOSIS — I4892 Unspecified atrial flutter: Secondary | ICD-10-CM | POA: Diagnosis present

## 2018-01-10 HISTORY — DX: Chronic kidney disease, stage 3 (moderate): N18.3

## 2018-01-10 HISTORY — DX: Chest pain, unspecified: R07.9

## 2018-01-10 HISTORY — DX: Chronic kidney disease, stage 3 unspecified: N18.30

## 2018-01-10 LAB — CBC
HCT: 38 % — ABNORMAL LOW (ref 39.0–52.0)
HEMOGLOBIN: 12.5 g/dL — AB (ref 13.0–17.0)
MCH: 28.9 pg (ref 26.0–34.0)
MCHC: 32.9 g/dL (ref 30.0–36.0)
MCV: 88 fL (ref 78.0–100.0)
Platelets: 201 10*3/uL (ref 150–400)
RBC: 4.32 MIL/uL (ref 4.22–5.81)
RDW: 13.6 % (ref 11.5–15.5)
WBC: 4.9 10*3/uL (ref 4.0–10.5)

## 2018-01-10 LAB — I-STAT TROPONIN, ED: TROPONIN I, POC: 0.01 ng/mL (ref 0.00–0.08)

## 2018-01-10 LAB — BASIC METABOLIC PANEL
ANION GAP: 9 (ref 5–15)
BUN: 18 mg/dL (ref 8–23)
CALCIUM: 9.9 mg/dL (ref 8.9–10.3)
CO2: 28 mmol/L (ref 22–32)
Chloride: 104 mmol/L (ref 98–111)
Creatinine, Ser: 1.28 mg/dL — ABNORMAL HIGH (ref 0.61–1.24)
GFR calc Af Amer: 59 mL/min — ABNORMAL LOW (ref >60)
GFR, EST NON AFRICAN AMERICAN: 51 mL/min — AB (ref >60)
GLUCOSE: 100 mg/dL — AB (ref 70–99)
Potassium: 4.5 mmol/L (ref 3.5–5.1)
Sodium: 141 mmol/L (ref 135–145)

## 2018-01-10 NOTE — ED Triage Notes (Signed)
Pt reports intermittent R sided chest pain x 3 weeks. States that today he was mowing the yard and started feeling chest pain and like he was going to "pass out" denies chest pain at this time. Was sent by his PCP. Pt states he has a hx of irregular heart rhythm.

## 2018-01-11 ENCOUNTER — Encounter (HOSPITAL_COMMUNITY): Payer: Self-pay | Admitting: Internal Medicine

## 2018-01-11 DIAGNOSIS — N183 Chronic kidney disease, stage 3 unspecified: Secondary | ICD-10-CM | POA: Diagnosis present

## 2018-01-11 DIAGNOSIS — I4892 Unspecified atrial flutter: Secondary | ICD-10-CM | POA: Diagnosis not present

## 2018-01-11 DIAGNOSIS — R072 Precordial pain: Secondary | ICD-10-CM

## 2018-01-11 DIAGNOSIS — I209 Angina pectoris, unspecified: Secondary | ICD-10-CM | POA: Diagnosis present

## 2018-01-11 DIAGNOSIS — R079 Chest pain, unspecified: Secondary | ICD-10-CM | POA: Diagnosis not present

## 2018-01-11 DIAGNOSIS — I700 Atherosclerosis of aorta: Secondary | ICD-10-CM | POA: Diagnosis not present

## 2018-01-11 LAB — LIPID PANEL
CHOLESTEROL: 174 mg/dL (ref 0–200)
HDL: 54 mg/dL (ref >40)
LDL CALC: 103 mg/dL — AB (ref 0–99)
Total CHOL/HDL Ratio: 3.2 RATIO
Triglycerides: 85 mg/dL (ref <150)
VLDL: 17 mg/dL (ref 0–40)

## 2018-01-11 LAB — TROPONIN I: Troponin I: <0.03 ng/mL (ref <0.03)

## 2018-01-11 MED ORDER — ASPIRIN 81 MG PO CHEW: 81.0000 mg | CHEWABLE_TABLET | Freq: Every day | ORAL | Status: DC | PRN

## 2018-01-11 MED ORDER — ACETAMINOPHEN 325 MG PO TABS: 650.0000 mg | ORAL_TABLET | ORAL | Status: DC | PRN

## 2018-01-11 MED ORDER — ASPIRIN 81 MG PO CHEW
324.0000 mg | CHEWABLE_TABLET | Freq: Once | ORAL | Status: AC
  Administered 2018-01-11: 324 mg via ORAL
  Filled 2018-01-11: qty 4

## 2018-01-11 MED ORDER — MORPHINE SULFATE (PF) 4 MG/ML IV SOLN: 2.0000 mg | INTRAVENOUS | Status: DC | PRN

## 2018-01-11 MED ORDER — GI COCKTAIL ~~LOC~~: 30.0000 mL | Freq: Four times a day (QID) | ORAL | Status: DC | PRN

## 2018-01-11 MED ORDER — ASPIRIN EC 325 MG PO TBEC: 325.0000 mg | DELAYED_RELEASE_TABLET | Freq: Every day | ORAL | Status: DC

## 2018-01-11 MED ORDER — ENOXAPARIN SODIUM 40 MG/0.4ML ~~LOC~~ SOLN
40.0000 mg | SUBCUTANEOUS | Status: DC
  Filled 2018-01-11 (×2): qty 0.4

## 2018-01-11 MED ORDER — ONDANSETRON HCL 4 MG/2ML IJ SOLN: 4.0000 mg | Freq: Four times a day (QID) | INTRAMUSCULAR | Status: DC | PRN

## 2018-01-11 NOTE — H&P (Signed)
History and Physical    Steven [name] OZH:[id number] DOB: March 20, 1935 DOA: 01/10/2018  PCP: Steven Fess, MD Patient coming from: home  Chief Complaint: chest pain  HPI: Steven [name] is a very pleasant 83 y.o. male with medical history significant for chronic kidney disease stage III, atrial fib/flutter status post ablation, gout, psoriasis presents to the emergency Department chief complaint chest pain. [facility] are asked to admit  Information is obtained from the patient and the chart. He states for the last 3 weeks he has had intermittent chest pain. He describes the pain as sharp constant located on the right anterior side radiating to the right lower quadrant. He reports no relationship between activity and/or eating with this pain. He's had the pain awakened him during sleep as well as while he was mowing the lawn. Associated symptoms include feeling like he was going to "pass out". He denies nausea/vomiting, diaphoresis.  He reports he went to his primary care provider who were concerned about EKG changes and sent to the emergency department. He took nothing for the pain. Unsure of the length of the episodes. He denies headache dizziness visual disturbances. Denies abdominal pain dysuria hematuria frequency or urgency. He denies diarrhea constipation melena bright red blood per rectum. He denies any lower extremity edema or orthopnea. Reports compliance with his medications  ED Course: in the emergency department he's afebrile hemodynamically stable and not hypoxic. He is provided with aspirin. At the time of admission he is pain-free  Review of Systems: As per HPI otherwise all other systems reviewed and are negative.   Ambulatory Status:ambulates independently is independent with ADLs  Past Medical History:  Diagnosis Date  . Atrial flutter ([place])   . Carpal tunnel syndrome   . Cataract    left eye  . Chest pain   . CKD (chronic kidney disease), stage III ([place])   .  Enlarged prostate   . History of gout   . History of kidney stones    pt has one now but not giving him any problems  . Numbness    both hands pt states pinched. 12-17-14 Gabapentin has improved.  . Pneumonia    history of pneumonia  . Psoriasis     Past Surgical History:  Procedure Laterality Date  . CATARACT EXTRACTION W/PHACO Left 03/15/2013   Procedure: CATARACT EXTRACTION PHACO AND INTRAOCULAR LENS PLACEMENT (IOC);  Surgeon: Adonis Brook, MD;  Location: [facility];  Service: Ophthalmology;  Laterality: Left;  . COLONOSCOPY    . COLONOSCOPY WITH PROPOFOL N/A 12/24/2014   Procedure: COLONOSCOPY WITH PROPOFOL;  Surgeon: Garlan Fair, MD;  Location: [facility];  Service: Endoscopy;  Laterality: N/A;  . ELECTROPHYSIOLOGIC STUDY N/A 02/28/2016   Procedure: A-Flutter Ablation;  Surgeon: Deboraha Sprang, MD;  Location: [facility];  Service: Cardiovascular;  Laterality: N/A;  . right cataract removed     . TONSILLECTOMY     as child    Social History   Socioeconomic History  . Marital status: Widowed    Spouse name: Not on file  . Number of children: Not on file  . Years of education: Not on file  . Highest education level: Not on file  Occupational History  . Not on file  Social Needs  . Financial resource strain: Not on file  . Food insecurity:    Worry: Not on file    Inability: Not on file  . Transportation needs:    Medical: Not on file  Non-medical: Not on file  Tobacco Use  . Smoking status: Former Smoker    Types: Cigars  . Smokeless tobacco: Never Used  . Tobacco comment: smoked cigars 44yrs ago  Substance and Sexual Activity  . Alcohol use: No  . Drug use: No  . Sexual activity: Never  Lifestyle  . Physical activity:    Days per week: Not on file    Minutes per session: Not on file  . Stress: Not on file  Relationships  . Social connections:    Talks on phone: Not on file    Gets together: Not on file    Attends religious service: Not on file     Active member of club or organization: Not on file    Attends meetings of clubs or organizations: Not on file    Relationship status: Not on file  . Intimate partner violence:    Fear of current or ex partner: Not on file    Emotionally abused: Not on file    Physically abused: Not on file    Forced sexual activity: Not on file  Other Topics Concern  . Not on file  Social History Narrative  . Not on file    No Known Allergies  Family History  Problem Relation Age of Onset  . Other Mother        Psychologist, forensic  . Healthy Father     Prior to Admission medications   Medication Sig Start Date End Date Taking? Authorizing Provider  aspirin 81 MG chewable tablet Chew 81 mg by mouth daily as needed for mild pain.   Yes [provider]    Physical Exam: Vitals:   01/11/18 0645 01/11/18 0700 01/11/18 0715 01/11/18 0750  BP:  121/65  (!) 142/66  Pulse: 78 75 80 73  Resp: 15 17 17 16   Temp:      TempSrc:      SpO2: 96% 95% 97% 96%  Weight:      Height:         General:  Appears calm and comfortable in no acute distress Eyes:  PERRL, EOMI, normal lids, iris ENT:  grossly normal hearing, lips & tongue,  Neck:  no LAD, masses or thyromegaly Cardiovascular:  RRR, +murmur. No LE edema.  Respiratory:  CTA bilaterally, no w/r/r. Normal respiratory effort. Abdomen:  soft, ntnd, positive bowel sounds throughout no guarding or rebounding Skin:  no rash or induration seen on limited exam Musculoskeletal:  grossly normal tone BUE/BLE, good ROM, no bony abnormality Psychiatric:  grossly normal mood and affect, speech fluent and appropriate, AOx3 Neurologic:  CN 2-12 grossly intact, moves all extremities in coordinated fashion, sensation intact  Labs on Admission: I have personally reviewed following labs and imaging studies  CBC: Recent Labs  Lab 01/10/18 1901  WBC 4.9  HGB 12.5*  HCT 38.0*  MCV 88.0  PLT 297   Basic Metabolic Panel: Recent Labs  Lab 01/10/18 1901    NA 141  K 4.5  CL 104  CO2 28  GLUCOSE 100*  BUN 18  CREATININE 1.28*  CALCIUM 9.9   GFR: Estimated Creatinine Clearance: 55.1 mL/min (A) (by C-G formula based on SCr of 1.28 mg/dL (H)). Liver Function Tests: No results for input(s): AST, ALT, ALKPHOS, BILITOT, PROT, ALBUMIN in the last 168 hours. No results for input(s): LIPASE, AMYLASE in the last 168 hours. No results for input(s): AMMONIA in the last 168 hours. Coagulation Profile: No results for input(s): INR, PROTIME in the  last 168 hours. Cardiac Enzymes: Recent Labs  Lab 01/11/18 0408 01/11/18 0701  TROPONINI <0.03 <0.03   BNP (last 3 results) No results for input(s): PROBNP in the last 8760 hours. HbA1C: No results for input(s): HGBA1C in the last 72 hours. CBG: No results for input(s): GLUCAP in the last 168 hours. Lipid Profile: No results for input(s): CHOL, HDL, LDLCALC, TRIG, CHOLHDL, LDLDIRECT in the last 72 hours. Thyroid Function Tests: No results for input(s): TSH, T4TOTAL, FREET4, T3FREE, THYROIDAB in the last 72 hours. Anemia Panel: No results for input(s): VITAMINB12, FOLATE, FERRITIN, TIBC, IRON, RETICCTPCT in the last 72 hours. Urine analysis:    Component Value Date/Time   COLORURINE YELLOW 08/16/2016 1126   APPEARANCEUR CLOUDY (A) 08/16/2016 1126   LABSPEC 1.016 08/16/2016 1126   PHURINE 5.0 08/16/2016 1126   GLUCOSEU NEGATIVE 08/16/2016 1126   HGBUR MODERATE (A) 08/16/2016 1126   BILIRUBINUR NEGATIVE 08/16/2016 1126   KETONESUR NEGATIVE 08/16/2016 1126   PROTEINUR 30 (A) 08/16/2016 1126   UROBILINOGEN 1.0 11/24/2013 2219   NITRITE POSITIVE (A) 08/16/2016 1126   LEUKOCYTESUR LARGE (A) 08/16/2016 1126    Creatinine Clearance: Estimated Creatinine Clearance: 55.1 mL/min (A) (by C-G formula based on SCr of 1.28 mg/dL (H)).  Sepsis Labs: @LABRCNTIP (procalcitonin:4,lacticidven:4) )No results found for this or any previous visit (from the past 240 hour(s)).   Radiological Exams on  Admission: Dg Chest 2 View  Result Date: 01/10/2018 CLINICAL DATA:  Right-sided chest pain and dyspnea x3 weeks. History of atrial flutter pneumonia. EXAM: CHEST - 2 VIEW COMPARISON:  08/16/2016 FINDINGS: Normal size heart. Mild aortic atherosclerosis without aneurysm. Clear lungs without active pulmonary consolidation or effusion. Chronic mild elevation right hemidiaphragm. No acute osseous abnormality. IMPRESSION: No active cardiopulmonary disease. Nonaneurysmal atherosclerotic aorta. Electronically Signed   By: Ashley Royalty M.D.   On: 01/10/2018 19:49    EKG: Independently reviewed. Normal sinus rhythm Left ventricular hypertrophy Abnormal ECG When compared with ECG of 08/16/2016, No significant change was found  Assessment/Plan Principal Problem:   Chest pain Active Problems:   Atrial flutter ([facility])   CKD (chronic kidney disease), stage III ([facility])   Murmur   1. Chest pain. Heart score 4. EKG as noted above. Initial troponin negative. Chest x-ray with no active cardiopulmonary disease. He is pain free on admission.  -admit to tele for observation -Cycle troponin -serial EKG -lipid panel -continue aspirin -may need satin -if rules out OP follow up with Dr Wynonia Lawman  2. Chronic kidney disease stage III. Creatinine 1.28. This appears to be close to his baseline. -Hold nephrotoxins -Monitor urine output  #3. Atrial flutter. Status post ablation 2017. Chart review indicates initially he was on our quest but that is since been discontinued EKG as noted above. Chart review also indicates cardiac evaluation 2016 included echocardiogram with a normal EF.  #4. Systolic murmur on exam. Chart review history of same. Known as mild AF by echo in 2015 -OP follow up Dr Wynonia Lawman   DVT prophylaxis: lovenox Code Status: full  Family Communication: none present  Disposition Plan: home  Consults called: none  Admission status: obs    Radene Gunning MD [facility]  If 7PM-7AM, please contact  night-coverage [email or web address] Password [email or web address]  01/11/2018, 9:01 AM

## 2018-01-11 NOTE — ED Notes (Signed)
Pt did not respond to vitals 1x

## 2018-01-11 NOTE — Plan of Care (Signed)
Received signout from [employer], PA-C. Patient is a 83 year old male with pmh of A. fib/flutter on Eliquis, CKD stage 3; who presented with complaints of chest pain and feeling as though he may pass out while mowing the lawn.  Vital signs otherwise noted to be stable.  Labs revealed initial negative troponin.  EKG similar to previous.  Patient currently reported be chest pain-free. [facility] called to admit given risk factors.  Accepted to a telemetry bed for observation.  Troponin every 3 hours ordered.

## 2018-01-11 NOTE — ED Notes (Signed)
Heart Healthy meal tray ordered.

## 2018-01-11 NOTE — Discharge Instructions (Signed)
Call Dr Wynonia Lawman office for appointment for 1-2 weeks Return to ED if symptoms return

## 2018-01-11 NOTE — ED Notes (Signed)
Spoke with Dr. Lorin Mercy pt will be discharged from [facility]. RN to call floor and bed placement to notify them.

## 2018-01-11 NOTE — ED Provider Notes (Signed)
[facility] EMERGENCY DEPARTMENT Provider Note   CSN: [id number] Arrival date & time: 01/10/18  1846     History   Chief Complaint Chief Complaint  Patient presents with  . Chest Pain    HPI Steven [name] is a 83 y.o. male.  Patient presents to the emergency department with a chief complaint of chest pain.  He states that he was mowing the yard today and began experiencing chest pain.  He felt like he was going to pass out.  He saw his doctor at [facility], and was told that he had EKG changes, and was sent to the emergency department for further evaluation.  Patient states that his pain has since ceased.  He has not taken anything for his symptoms.  Denies any shortness of breath or diaphoresis.  He is followed by Dr. Wynonia Lawman of cardiology.  The history is provided by the patient. No language interpreter was used.    Past Medical History:  Diagnosis Date  . Atrial flutter ([place])   . Carpal tunnel syndrome   . Cataract    left eye  . Enlarged prostate   . History of gout   . History of kidney stones    pt has one now but not giving him any problems  . Numbness    both hands pt states pinched. 12-17-14 Gabapentin has improved.  . Pneumonia    history of pneumonia  . Psoriasis     Patient Active Problem List   Diagnosis Date Noted  . Atrial flutter ([place]) 02/28/2016  . Leukocytopenia 03/11/2015  . Deficiency anemia 02/04/2015  . Olecranon bursitis of right elbow 11/24/2013  . Community acquired pneumonia 11/24/2013  . Murmur 11/24/2013    Past Surgical History:  Procedure Laterality Date  . CATARACT EXTRACTION W/PHACO Left 03/15/2013   Procedure: CATARACT EXTRACTION PHACO AND INTRAOCULAR LENS PLACEMENT (IOC);  Surgeon: Adonis Brook, MD;  Location: [facility];  Service: Ophthalmology;  Laterality: Left;  . COLONOSCOPY    . COLONOSCOPY WITH PROPOFOL N/A 12/24/2014   Procedure: COLONOSCOPY WITH PROPOFOL;  Surgeon: Garlan Fair, MD;  Location:  [facility];  Service: Endoscopy;  Laterality: N/A;  . ELECTROPHYSIOLOGIC STUDY N/A 02/28/2016   Procedure: A-Flutter Ablation;  Surgeon: Deboraha Sprang, MD;  Location: [facility];  Service: Cardiovascular;  Laterality: N/A;  . right cataract removed     . TONSILLECTOMY     as child        Home Medications    Prior to Admission medications   Medication Sig Start Date End Date Taking? Authorizing Provider  castor oil liquid Take 10 mLs by mouth daily as needed for moderate constipation.    [provider]  ciprofloxacin (CIPRO) 500 MG tablet Take 1 tablet (500 mg total) by mouth 2 (two) times daily. One po bid x 7 days 08/16/16   Deno Etienne, DO    Family History Family History  Problem Relation Age of Onset  . Other Mother        Psychologist, forensic  . Healthy Father     Social History Social History   Tobacco Use  . Smoking status: Former Smoker    Types: Cigars  . Smokeless tobacco: Never Used  . Tobacco comment: smoked cigars 68yrs ago  Substance Use Topics  . Alcohol use: No  . Drug use: No     Allergies   Patient has no known allergies.   Review of Systems Review of Systems  All other systems reviewed and are negative.    Physical Exam Updated Vital Signs BP (!) 149/77   Pulse 89   Temp 97.7 F (36.5 C) (Oral)   Resp 18   Ht 6' (1.829 m)   Wt 98.9 kg (218 lb)   SpO2 97%   BMI 29.57 kg/m   Physical Exam  Constitutional: He is oriented to person, place, and time. He appears well-developed and well-nourished.  HENT:  Head: Normocephalic and atraumatic.  Eyes: Pupils are equal, round, and reactive to light. Conjunctivae and EOM are normal. Right eye exhibits no discharge. Left eye exhibits no discharge. No scleral icterus.  Neck: Normal range of motion. Neck supple. No JVD present.  Cardiovascular: Normal rate, regular rhythm and normal heart sounds. Exam reveals no gallop and no friction rub.  No murmur heard. Pulmonary/Chest: Effort  normal and breath sounds normal. No respiratory distress. He has no wheezes. He has no rales. He exhibits no tenderness.  Abdominal: Soft. He exhibits no distension and no mass. There is no tenderness. There is no rebound and no guarding.  Musculoskeletal: Normal range of motion. He exhibits no edema or tenderness.  Neurological: He is alert and oriented to person, place, and time.  Skin: Skin is warm and dry.  Psychiatric: He has a normal mood and affect. His behavior is normal. Judgment and thought content normal.  Nursing note and vitals reviewed.    ED Treatments / Results  Labs (all labs ordered are listed, but only abnormal results are displayed) Labs Reviewed  BASIC METABOLIC PANEL - Abnormal; Notable for the following components:      Result Value   Glucose, Bld 100 (*)    Creatinine, Ser 1.28 (*)    GFR calc non Af Amer 51 (*)    GFR calc Af Amer 59 (*)    All other components within normal limits  CBC - Abnormal; Notable for the following components:   Hemoglobin 12.5 (*)    HCT 38.0 (*)    All other components within normal limits  I-STAT TROPONIN, ED    EKG EKG Interpretation  Date/Time:  Monday January 10 2018 18:53:55 EDT Ventricular Rate:  67 PR Interval:  170 QRS Duration: 96 QT Interval:  384 QTC Calculation: 405 R Axis:   -5 Text Interpretation:  Normal sinus rhythm Left ventricular hypertrophy Abnormal ECG When compared with ECG of 08/16/2016, No significant change was found Confirmed by Delora Fuel ([id number]) on 01/10/2018 11:07:43 PM   Radiology Dg Chest 2 View  Result Date: 01/10/2018 CLINICAL DATA:  Right-sided chest pain and dyspnea x3 weeks. History of atrial flutter pneumonia. EXAM: CHEST - 2 VIEW COMPARISON:  08/16/2016 FINDINGS: Normal size heart. Mild aortic atherosclerosis without aneurysm. Clear lungs without active pulmonary consolidation or effusion. Chronic mild elevation right hemidiaphragm. No acute osseous abnormality. IMPRESSION: No active  cardiopulmonary disease. Nonaneurysmal atherosclerotic aorta. Electronically Signed   By: Ashley Royalty M.D.   On: 01/10/2018 19:49    Procedures Procedures (including critical care time)  Medications Ordered in ED Medications - No data to display   Initial Impression / Assessment and Plan / ED Course  I have reviewed the triage vital signs and the nursing notes.  Pertinent labs & imaging results that were available during my care of the patient were reviewed by me and considered in my medical decision making (see chart for details).     Patient with chest pain.  Reports exertional symptoms today while mowing the lawn.  States  that he felt like he was going to pass out.  Went to his doctor's office and had an EKG, which was reportedly different from priors.  He was sent to the emergency department for further evaluation.   Discussed with Dr. Dina Rich, who states our EKG today looks similar to priors, and his troponin is negative, but given his exertional anginal type symptoms, recommend observation for chest pain rule out.  Appreciate Dr. Tamala Julian for bringing patient in for obs.  Final Clinical Impressions(s) / ED Diagnoses   Final diagnoses:  Chest pain, unspecified type    ED Discharge Orders    None       Montine Circle, PA-C 01/11/18 0315    Merryl Hacker, MD 01/12/18 [phone]

## 2018-01-11 NOTE — Discharge Summary (Signed)
Physician Discharge Summary  Steven [name] ZTI:[id number] DOB: 05-Nov-1934 DOA: 01/10/2018  PCP: Hulan Fess, MD  Admit date: 01/10/2018 Discharge date: 01/11/2018  Time spent: 60 minutes  Recommendations for Outpatient Follow-up:  1. Call Dr. Thurman Coyer office for a follow-up appointment 1-2 weeks  Discharge Diagnoses:  Principal Problem:   Chest pain Active Problems:   Atrial flutter ([place])   CKD (chronic kidney disease), stage III ([place])   Murmur   Discharge Condition: stable  Diet recommendation: heart healthy  Filed Weights   01/10/18 1855  Weight: 98.9 kg (218 lb)    History of present illness:  Patient was a past medical history chronic kidney disease stage III, atrial flutter on aspirin monotherapy,gout presented to the emergency Department chief complaint of chest pain. Patient was admitted for chest pain rule out.  Hospital Course:  1. Chest pain. Heart score 4. EKG as noted above. Initial troponin negative. Chest x-ray with no active cardiopulmonary disease. He gross pain free on admission. troponins negative. EKG without acute changes.lipid panel with LDL 103 otherwise within the limits of normal No further pain. Discharged to home with instructions to follow-up with Dr. telemetry  2. Chronic kidney disease stage III. Creatinine 1.28. This close to his baseline.  #3. Atrial flutter. Status post ablation 2017. Chart review indicates initially he was on eliquis but  since been discontinued EKG as noted above. Chart review also indicated cardiac evaluation 2016 included echocardiogram with a normal EF.  #4. Systolic murmur on exam. Chart review history of same. Known as mild AF by echo in 2015 -OP follow up Dr Wynonia Lawman   DVT prophylaxis: lovenox    Procedures:  Consultations:  none  Discharge Exam: Vitals:   01/11/18 0801 01/11/18 1130  BP: 133/64 (!) 147/43  Pulse: 73 (!) 54  Resp: 16 15  Temp:    SpO2: 95% 99%    General: well nourished in no  acute distress Cardiovascular: RRR +mumur no LE edema Respiratory: normal effort. BS clear bilaterally. No wheeze, rhonchi  Discharge Instructions   Discharge Instructions    Diet - low sodium heart healthy   Complete by:  As directed    Discharge instructions   Complete by:  As directed    Call Dr Thurman Coyer office for follow up with 1-2 weeks Should symtoms return come back to emergency department   Increase activity slowly   Complete by:  As directed      Allergies as of 01/11/2018   No Known Allergies     Medication List    TAKE these medications   aspirin 81 MG chewable tablet Chew 81 mg by mouth daily as needed for mild pain.      No Known Allergies    The results of significant diagnostics from this hospitalization (including imaging, microbiology, ancillary and laboratory) are listed below for reference.    Significant Diagnostic Studies: Dg Chest 2 View  Result Date: 01/10/2018 CLINICAL DATA:  Right-sided chest pain and dyspnea x3 weeks. History of atrial flutter pneumonia. EXAM: CHEST - 2 VIEW COMPARISON:  08/16/2016 FINDINGS: Normal size heart. Mild aortic atherosclerosis without aneurysm. Clear lungs without active pulmonary consolidation or effusion. Chronic mild elevation right hemidiaphragm. No acute osseous abnormality. IMPRESSION: No active cardiopulmonary disease. Nonaneurysmal atherosclerotic aorta. Electronically Signed   By: Ashley Royalty M.D.   On: 01/10/2018 19:49    Microbiology: No results found for this or any previous visit (from the past 240 hour(s)).   Labs: Basic Metabolic Panel: Recent Labs  Lab 01/10/18 1901  NA 141  K 4.5  CL 104  CO2 28  GLUCOSE 100*  BUN 18  CREATININE 1.28*  CALCIUM 9.9   Liver Function Tests: No results for input(s): AST, ALT, ALKPHOS, BILITOT, PROT, ALBUMIN in the last 168 hours. No results for input(s): LIPASE, AMYLASE in the last 168 hours. No results for input(s): AMMONIA in the last 168  hours. CBC: Recent Labs  Lab 01/10/18 1901  WBC 4.9  HGB 12.5*  HCT 38.0*  MCV 88.0  PLT 201   Cardiac Enzymes: Recent Labs  Lab 01/11/18 0408 01/11/18 0701 01/11/18 1115  TROPONINI <0.03 <0.03 <0.03   BNP: BNP (last 3 results) No results for input(s): BNP in the last 8760 hours.  ProBNP (last 3 results) No results for input(s): PROBNP in the last 8760 hours.  CBG: No results for input(s): GLUCAP in the last 168 hours.     Signed:  Radene Gunning MD.  [facility] 01/11/2018, 3:30 PM

## 2018-01-11 NOTE — ED Notes (Signed)
Troponin drawn at 7:50 next troponin due 10:50

## 2018-01-25 DIAGNOSIS — I119 Hypertensive heart disease without heart failure: Secondary | ICD-10-CM | POA: Diagnosis not present

## 2018-01-28 DIAGNOSIS — I483 Typical atrial flutter: Secondary | ICD-10-CM | POA: Diagnosis not present

## 2018-01-28 DIAGNOSIS — I359 Nonrheumatic aortic valve disorder, unspecified: Secondary | ICD-10-CM | POA: Diagnosis not present

## 2018-01-28 DIAGNOSIS — I350 Nonrheumatic aortic (valve) stenosis: Secondary | ICD-10-CM | POA: Diagnosis not present

## 2018-01-28 DIAGNOSIS — E668 Other obesity: Secondary | ICD-10-CM | POA: Diagnosis not present

## 2018-01-28 DIAGNOSIS — I491 Atrial premature depolarization: Secondary | ICD-10-CM | POA: Diagnosis not present

## 2018-01-28 DIAGNOSIS — R0789 Other chest pain: Secondary | ICD-10-CM | POA: Diagnosis not present

## 2018-01-28 DIAGNOSIS — I119 Hypertensive heart disease without heart failure: Secondary | ICD-10-CM | POA: Diagnosis not present

## 2018-01-29 IMAGING — CR DG CHEST 2V
2 series · 2 of 2 positions shown · non-contrast
Comparison: November 15, 2015

CLINICAL DATA: Chills and weakness

EXAM:
CHEST  2 VIEW

[w chest pa]
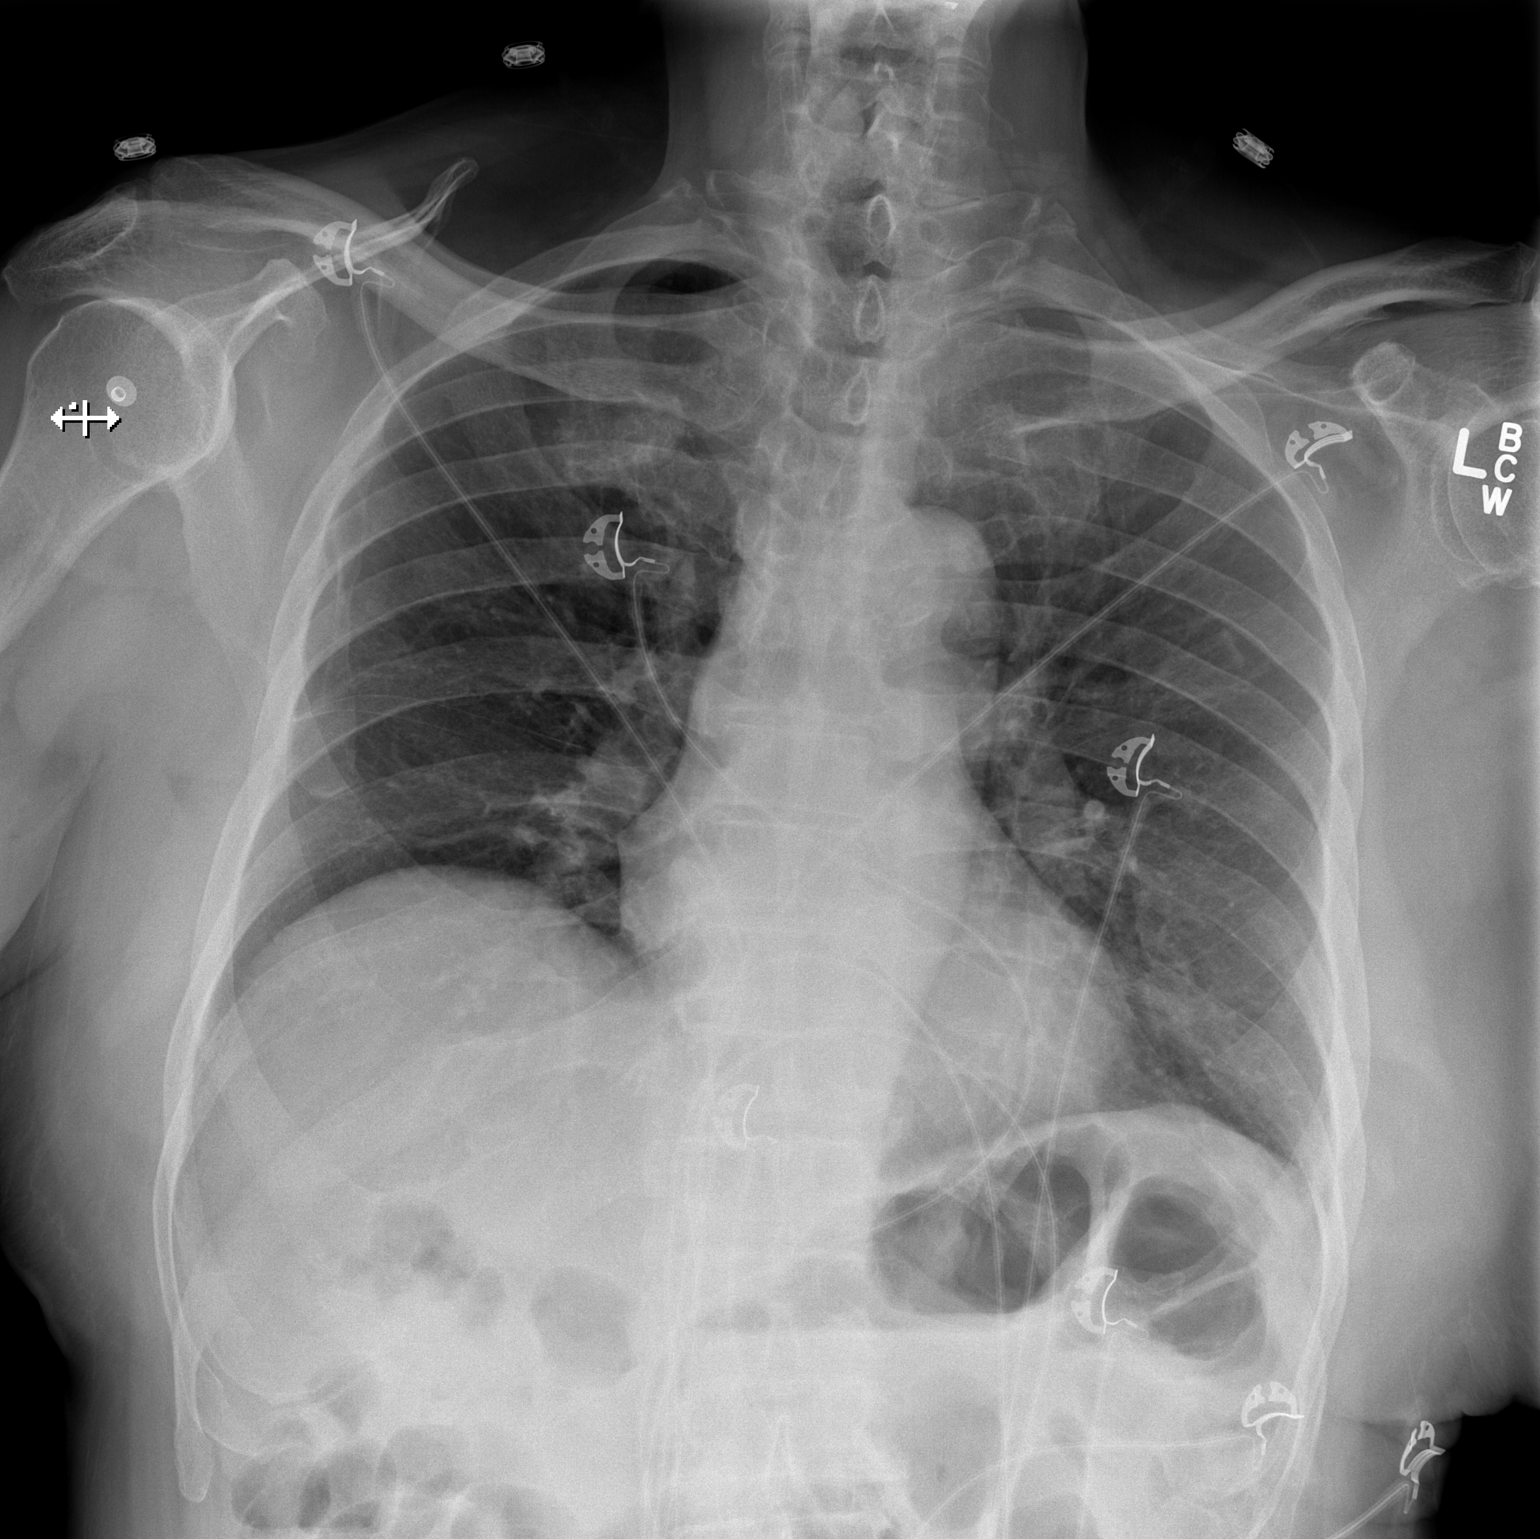

[w chest lat]
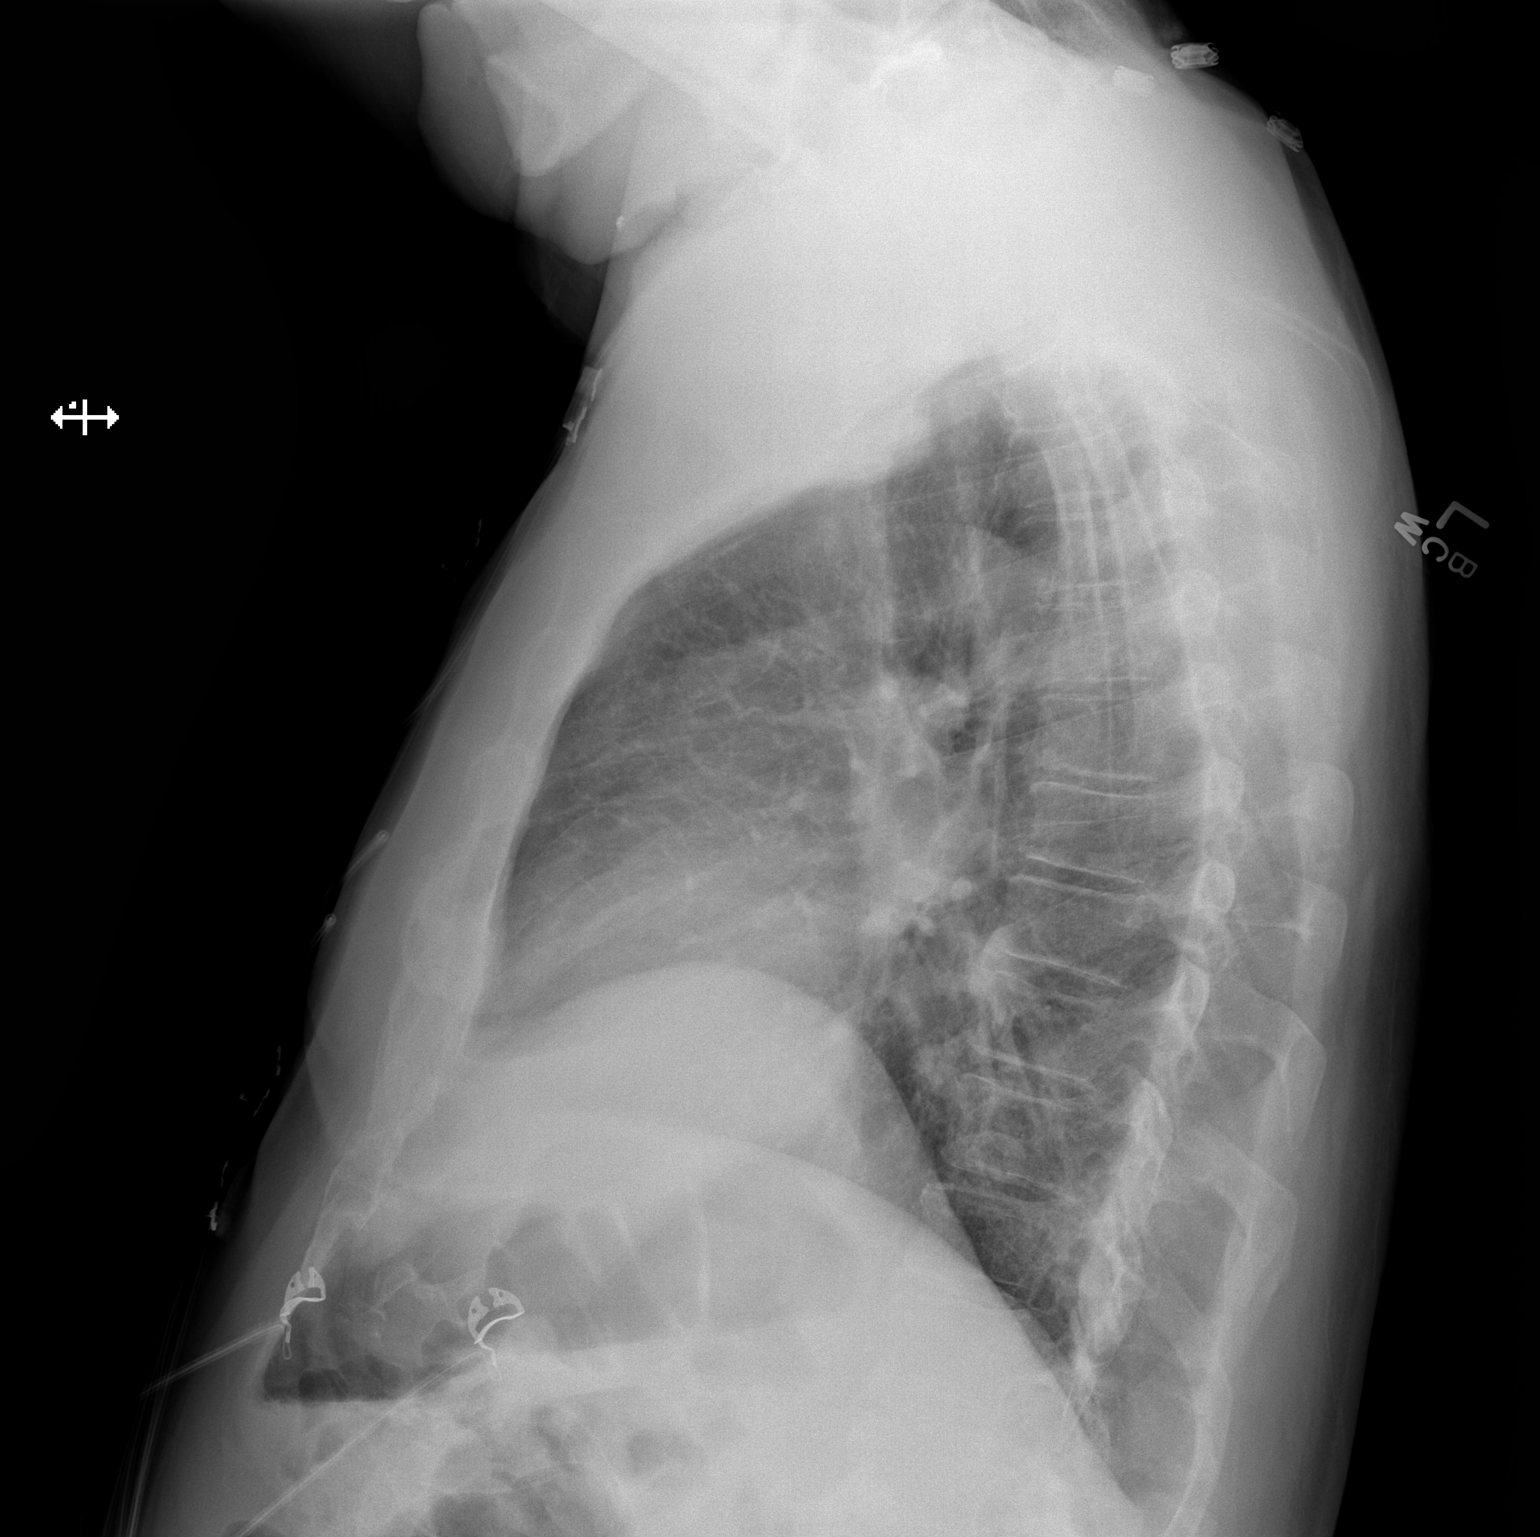

[2 of 2 positions shown; findings below may reference images not displayed]

FINDINGS: There is no edema or consolidation. Heart size and pulmonary
vascularity are normal. No adenopathy. There is atherosclerotic
calcification in the aorta. No bone lesions.
IMPRESSION: Aortic atherosclerosis.  No edema or consolidation.

## 2018-02-10 DIAGNOSIS — I350 Nonrheumatic aortic (valve) stenosis: Secondary | ICD-10-CM | POA: Diagnosis not present

## 2018-02-10 DIAGNOSIS — R0789 Other chest pain: Secondary | ICD-10-CM | POA: Diagnosis not present

## 2018-02-10 DIAGNOSIS — I359 Nonrheumatic aortic valve disorder, unspecified: Secondary | ICD-10-CM | POA: Diagnosis not present

## 2018-02-10 DIAGNOSIS — I483 Typical atrial flutter: Secondary | ICD-10-CM | POA: Diagnosis not present

## 2018-02-10 DIAGNOSIS — E668 Other obesity: Secondary | ICD-10-CM | POA: Diagnosis not present

## 2018-02-10 DIAGNOSIS — I491 Atrial premature depolarization: Secondary | ICD-10-CM | POA: Diagnosis not present

## 2018-02-10 DIAGNOSIS — I119 Hypertensive heart disease without heart failure: Secondary | ICD-10-CM | POA: Diagnosis not present

## 2018-02-11 ENCOUNTER — Encounter: Payer: Self-pay | Admitting: Cardiology

## 2018-02-11 DIAGNOSIS — I359 Nonrheumatic aortic valve disorder, unspecified: Secondary | ICD-10-CM | POA: Diagnosis not present

## 2018-02-11 DIAGNOSIS — I119 Hypertensive heart disease without heart failure: Secondary | ICD-10-CM | POA: Diagnosis not present

## 2018-02-11 DIAGNOSIS — E668 Other obesity: Secondary | ICD-10-CM | POA: Diagnosis not present

## 2018-02-11 DIAGNOSIS — I483 Typical atrial flutter: Secondary | ICD-10-CM | POA: Diagnosis not present

## 2018-02-11 DIAGNOSIS — I350 Nonrheumatic aortic (valve) stenosis: Secondary | ICD-10-CM | POA: Diagnosis not present

## 2018-02-11 DIAGNOSIS — I491 Atrial premature depolarization: Secondary | ICD-10-CM | POA: Diagnosis not present

## 2018-02-11 DIAGNOSIS — R0789 Other chest pain: Secondary | ICD-10-CM | POA: Diagnosis not present

## 2018-02-11 NOTE — H&P (View-Only) (Signed)
Steven [name], Steven [name]  Date of visit:  02/11/2018 DOB:  [birth year]    Age:  83 yrs. Medical record number:  [id number]     Account number:  [id number] Primary Care Provider: Hulan Fess ____________________________ CURRENT DIAGNOSES  1. Aortic valve stenosis  2. Chest pain  3. Atrial premature depolarization  4. Hypertensive heart disease without heart failure  5. Obesity  6. Atrial flutter, typical ____________________________ ALLERGIES  No Known Allergies ____________________________ MEDICATIONS  1. aspirin 81 mg chewable tablet, Take as directed  2. atorvastatin 40 mg tablet, 1 p.o. daily  3. cyanocobalamin (vitamin B-12) 1,000 mcg capsule, 1 p.o. daily  4. Fish Oil 1,000 mg (120 mg-180 mg) capsule, 1 p.o. daily  5. metoprolol succinate ER 25 mg tablet,extended release 24 hr, 1 p.o. daily ____________________________ HISTORY OF PRESENT ILLNESS The patient was seen for a precath evaluation. The patient has a history of atrial flutter and had a previous atrial flutter ablation. He also has moderate aortic stenosis. He had a catheterization in 2002 because of chest discomfort and an abnormal stress test that did not show any significant CAD but had mild disease in the circumflex. He was recently admitted to the hospital with chest discomfort. He was admitted overnight and discharge without much of a cardiac workup. He was later followed back up in the office. The chest discomfort is somewhat atypical. It is described as right-sided and pretty much is there all of the time. He was able to exercise and going to the gym and working out without difficulty and so we deferred workup on him but he continued to have chest discomfort. An echocardiogram showed a peak gradient of 30 mm across the aortic valve. Myocardial perfusion scan done on 8:15 showed 3-4 mm of ST depression in the anterior leads with T wave changes in recovery associated with typical angina that radiated up into his neck at a low level of  exercise. His myocardial perfusion images however did not show ischemia. Because of ongoing chest discomfort, abnormal response the exercise at a low level and continued chest pain catheterization is recommended to exclude global ischemia or severe critical disease. He was started on low-dose metoprolol as well as atorvastatin. ____________________________ PAST HISTORY  Past Medical Illnesses:  hypertension, BPH, gout, peripheral edema, pneumonia, TMJ, obesity, cervical disc disease, cervical spondylosis;  Cardiovascular Illnesses:  aortic stenosis, atrial flutter;  Surgical Procedures:  cataract extraction OU, finger injury;  NYHA Classification:  I;  Canadian Angina Classification:  Class 0: Asymptomatic;  Cardiology Procedures-Invasive:  cardiac cath (left) 2002, RF ablation for atrial flutter September 2017;  Cardiology Procedures-Noninvasive:  treadmill, echocardiogram May 2016, echocardiogram October 2018, echocardiogram August 2019;  Cardiac Cath Results:  mild disease involving circumflex;  LVEF of 60% documented via nuclear study on 02/10/2018,   ____________________________ CARDIO-PULMONARY TEST DATES EKG Date:  01/25/2018;   Cardiac Cath Date:  11/15/2000;  Nuclear Study Date:  02/10/2018;  Echocardiography Date: 01/28/2018;  Chest Xray Date: 01/10/2018;   ____________________________ SOCIAL HISTORY Alcohol Use:  no alcohol use;  Smoking:  used to smoke but quit Prior to 1980;  Diet:  regular diet;  Lifestyle:  widower;  Exercise:  some exercise;  Occupation:  retired and [employer];  Residence:  lives with son;   ____________________________ REVIEW OF SYSTEMS General:  denies recent weight change, fatique or change in exercise tolerance.  Integumentary:psoriasis Eyes: wears eye glasses/contact lenses, decreased acuity Ears, Nose, Throat, Mouth:  denies any hearing loss, epistaxis, hoarseness or difficulty speaking. Respiratory:  denies dyspnea, cough, wheezing or hemoptysis. Cardiovascular:   please review HPI Abdominal: denies dyspepsia, GI bleeding, constipation, or diarrhea Genitourinary-Male: frequency, nocturia, BPH  Musculoskeletal:  cervical spondylosis Neurological:  denies headaches, stroke, or TIA Psychiatric:  denies depression or anxiety  ____________________________ PHYSICAL EXAMINATION VITAL SIGNS  Blood Pressure:  126/66 Sitting, Left arm, regular cuff  , 130/62 Standing, Left arm and regular cuff   Pulse:  60/min. Weight:  223.00 lbs. Height:  73"BMI: 29  Constitutional:  pleasant African Americian male in no acute distress Skin:  warm and dry to touch, no apparent skin lesions, or masses noted. Head:  normocephalic, balding male hair pattern Eyes:  EOMS Intact, PERRLA, C and S clear, Funduscopic exam not done. ENT:  ears, nose and throat reveal no gross abnormalities.  Dentition good. Neck:  supple, without massess. No JVD, thyromegaly or carotid bruits. Carotid upstroke normal. Chest:  normal symmetry, clear to auscultation. Cardiac:  regular rhythm, normal S1 and S2, no S3 or S4, grade 2/6 systolic murmur at aortic area radiating to neck Abdomen:  abdomen soft,non-tender, no masses, no hepatospenomegaly, or aneurysm noted Peripheral Pulses:  the femoral,dorsalis pedis, and posterior tibial pulses are full and equal bilaterally with no bruits auscultated. Extremities & Back:  no deformities, clubbing, cyanosis, erythema or edema observed. Normal muscle strength and tone. Neurological:  no gross motor or sensory deficits noted, affect appropriate, oriented x3. ____________________________ MOST RECENT LIPID PANEL 01/11/18  CHOL TOTL 174 mg/dl, LDL 103 NM, HDL 54 mg/dl, TRIGLYCER 85 mg/dl, CHOL/HDL 3.2 (Calc) and VLDL 17 ____________________________ IMPRESSIONS/PLAN  1. Chest discomfort with some atypical features but also with typical angina with abnormal EKG response at a low level of exercise 2. Hypertension 3. Mild to moderate aortic stenosis 4. History of  atrial flutter with previous ablation  Recommendations:  The patient will be brought in for same-day right and left heart catheterization.Cardiac catheterization was discussed with the patient including risks of myocardial infarction, death, stroke, bleeding, arrhythmia, dye allergy, or renal insufficiency. He understands and is willing to proceed. Possibility of percutaneous intervention at the same setting was also discussed with the patient including risks. ____________________________ TODAYS ORDERS  1. Comprehensive Metabolic Panel: Today  2. Complete Blood Count: Today  3. Left Heart Cath/Poss PCI: First Available                       ____________________________ Cardiology Physician:  Kerry Hough MD [facility]

## 2018-02-11 NOTE — Progress Notes (Signed)
Steven [name], Steven [name]  Date of visit:  02/11/2018 DOB:  24-Jun-1935    Age:  83 yrs. Medical record number:  [id number]     Account number:  [id number] Primary Care Provider: Hulan Fess ____________________________ CURRENT DIAGNOSES  1. Aortic valve stenosis  2. Chest pain  3. Atrial premature depolarization  4. Hypertensive heart disease without heart failure  5. Obesity  6. Atrial flutter, typical ____________________________ ALLERGIES  No Known Allergies ____________________________ MEDICATIONS  1. aspirin 81 mg chewable tablet, Take as directed  2. atorvastatin 40 mg tablet, 1 p.o. daily  3. cyanocobalamin (vitamin B-12) 1,000 mcg capsule, 1 p.o. daily  4. Fish Oil 1,000 mg (120 mg-180 mg) capsule, 1 p.o. daily  5. metoprolol succinate ER 25 mg tablet,extended release 24 hr, 1 p.o. daily ____________________________ HISTORY OF PRESENT ILLNESS The patient was seen for a precath evaluation. The patient has a history of atrial flutter and had a previous atrial flutter ablation. He also has moderate aortic stenosis. He had a catheterization in 2002 because of chest discomfort and an abnormal stress test that did not show any significant CAD but had mild disease in the circumflex. He was recently admitted to the hospital with chest discomfort. He was admitted overnight and discharge without much of a cardiac workup. He was later followed back up in the office. The chest discomfort is somewhat atypical. It is described as right-sided and pretty much is there all of the time. He was able to exercise and going to the gym and working out without difficulty and so we deferred workup on him but he continued to have chest discomfort. An echocardiogram showed a peak gradient of 30 mm across the aortic valve. Myocardial perfusion scan done on 8:15 showed 3-4 mm of ST depression in the anterior leads with T wave changes in recovery associated with typical angina that radiated up into his neck at a low level of  exercise. His myocardial perfusion images however did not show ischemia. Because of ongoing chest discomfort, abnormal response the exercise at a low level and continued chest pain catheterization is recommended to exclude global ischemia or severe critical disease. He was started on low-dose metoprolol as well as atorvastatin. ____________________________ PAST HISTORY  Past Medical Illnesses:  hypertension, BPH, gout, peripheral edema, pneumonia, TMJ, obesity, cervical disc disease, cervical spondylosis;  Cardiovascular Illnesses:  aortic stenosis, atrial flutter;  Surgical Procedures:  cataract extraction OU, finger injury;  NYHA Classification:  I;  Canadian Angina Classification:  Class 0: Asymptomatic;  Cardiology Procedures-Invasive:  cardiac cath (left) 2002, RF ablation for atrial flutter September 2017;  Cardiology Procedures-Noninvasive:  treadmill, echocardiogram May 2016, echocardiogram October 2018, echocardiogram August 2019;  Cardiac Cath Results:  mild disease involving circumflex;  LVEF of 60% documented via nuclear study on 02/10/2018,   ____________________________ CARDIO-PULMONARY TEST DATES EKG Date:  01/25/2018;   Cardiac Cath Date:  11/15/2000;  Nuclear Study Date:  02/10/2018;  Echocardiography Date: 01/28/2018;  Chest Xray Date: 01/10/2018;   ____________________________ SOCIAL HISTORY Alcohol Use:  no alcohol use;  Smoking:  used to smoke but quit Prior to 1980;  Diet:  regular diet;  Lifestyle:  widower;  Exercise:  some exercise;  Occupation:  retired and [employer];  Residence:  lives with son;   ____________________________ REVIEW OF SYSTEMS General:  denies recent weight change, fatique or change in exercise tolerance.  Integumentary:psoriasis Eyes: wears eye glasses/contact lenses, decreased acuity Ears, Nose, Throat, Mouth:  denies any hearing loss, epistaxis, hoarseness or difficulty speaking. Respiratory:  denies dyspnea, cough, wheezing or hemoptysis. Cardiovascular:   please review HPI Abdominal: denies dyspepsia, GI bleeding, constipation, or diarrhea Genitourinary-Male: frequency, nocturia, BPH  Musculoskeletal:  cervical spondylosis Neurological:  denies headaches, stroke, or TIA Psychiatric:  denies depression or anxiety  ____________________________ PHYSICAL EXAMINATION VITAL SIGNS  Blood Pressure:  126/66 Sitting, Left arm, regular cuff  , 130/62 Standing, Left arm and regular cuff   Pulse:  60/min. Weight:  223.00 lbs. Height:  73"BMI: 29  Constitutional:  pleasant African Americian male in no acute distress Skin:  warm and dry to touch, no apparent skin lesions, or masses noted. Head:  normocephalic, balding male hair pattern Eyes:  EOMS Intact, PERRLA, C and S clear, Funduscopic exam not done. ENT:  ears, nose and throat reveal no gross abnormalities.  Dentition good. Neck:  supple, without massess. No JVD, thyromegaly or carotid bruits. Carotid upstroke normal. Chest:  normal symmetry, clear to auscultation. Cardiac:  regular rhythm, normal S1 and S2, no S3 or S4, grade 2/6 systolic murmur at aortic area radiating to neck Abdomen:  abdomen soft,non-tender, no masses, no hepatospenomegaly, or aneurysm noted Peripheral Pulses:  the femoral,dorsalis pedis, and posterior tibial pulses are full and equal bilaterally with no bruits auscultated. Extremities & Back:  no deformities, clubbing, cyanosis, erythema or edema observed. Normal muscle strength and tone. Neurological:  no gross motor or sensory deficits noted, affect appropriate, oriented x3. ____________________________ MOST RECENT LIPID PANEL 01/11/18  CHOL TOTL 174 mg/dl, LDL 103 NM, HDL 54 mg/dl, TRIGLYCER 85 mg/dl, CHOL/HDL 3.2 (Calc) and VLDL 17 ____________________________ IMPRESSIONS/PLAN  1. Chest discomfort with some atypical features but also with typical angina with abnormal EKG response at a low level of exercise 2. Hypertension 3. Mild to moderate aortic stenosis 4. History of  atrial flutter with previous ablation  Recommendations:  The patient will be brought in for same-day right and left heart catheterization.Cardiac catheterization was discussed with the patient including risks of myocardial infarction, death, stroke, bleeding, arrhythmia, dye allergy, or renal insufficiency. He understands and is willing to proceed. Possibility of percutaneous intervention at the same setting was also discussed with the patient including risks. ____________________________ TODAYS ORDERS  1. Comprehensive Metabolic Panel: Today  2. Complete Blood Count: Today  3. Left Heart Cath/Poss PCI: First Available                       ____________________________ Cardiology Physician:  Kerry Hough MD [facility]

## 2018-02-18 DIAGNOSIS — N281 Cyst of kidney, acquired: Secondary | ICD-10-CM | POA: Diagnosis not present

## 2018-02-18 DIAGNOSIS — N400 Enlarged prostate without lower urinary tract symptoms: Secondary | ICD-10-CM | POA: Diagnosis not present

## 2018-02-18 DIAGNOSIS — N5201 Erectile dysfunction due to arterial insufficiency: Secondary | ICD-10-CM | POA: Diagnosis not present

## 2018-02-24 ENCOUNTER — Ambulatory Visit (HOSPITAL_COMMUNITY)
Admission: RE | Disposition: A | Discharge status: Home / Self Care | Payer: Self-pay | Source: Ambulatory Visit | Attending: Interventional Cardiology

## 2018-02-24 ENCOUNTER — Ambulatory Visit (HOSPITAL_COMMUNITY)
Admission: RE | Admit: 2018-02-24 | Discharge: 2018-02-24 | Disposition: A | Discharge status: Home / Self Care | Payer: Medicare HMO | Source: Ambulatory Visit | Attending: Interventional Cardiology | Admitting: Interventional Cardiology

## 2018-02-24 ENCOUNTER — Encounter (HOSPITAL_COMMUNITY): Payer: Self-pay | Admitting: Cardiology

## 2018-02-24 DIAGNOSIS — I119 Hypertensive heart disease without heart failure: Secondary | ICD-10-CM | POA: Diagnosis not present

## 2018-02-24 DIAGNOSIS — I209 Angina pectoris, unspecified: Secondary | ICD-10-CM

## 2018-02-24 DIAGNOSIS — E669 Obesity, unspecified: Secondary | ICD-10-CM | POA: Diagnosis not present

## 2018-02-24 DIAGNOSIS — Z6829 Body mass index (BMI) 29.0-29.9, adult: Secondary | ICD-10-CM | POA: Insufficient documentation

## 2018-02-24 DIAGNOSIS — M109 Gout, unspecified: Secondary | ICD-10-CM | POA: Diagnosis not present

## 2018-02-24 DIAGNOSIS — I491 Atrial premature depolarization: Secondary | ICD-10-CM | POA: Diagnosis not present

## 2018-02-24 DIAGNOSIS — Z7982 Long term (current) use of aspirin: Secondary | ICD-10-CM | POA: Insufficient documentation

## 2018-02-24 DIAGNOSIS — N400 Enlarged prostate without lower urinary tract symptoms: Secondary | ICD-10-CM | POA: Diagnosis not present

## 2018-02-24 DIAGNOSIS — Z87891 Personal history of nicotine dependence: Secondary | ICD-10-CM | POA: Insufficient documentation

## 2018-02-24 DIAGNOSIS — I25119 Atherosclerotic heart disease of native coronary artery with unspecified angina pectoris: Secondary | ICD-10-CM

## 2018-02-24 DIAGNOSIS — Z955 Presence of coronary angioplasty implant and graft: Secondary | ICD-10-CM

## 2018-02-24 DIAGNOSIS — I350 Nonrheumatic aortic (valve) stenosis: Secondary | ICD-10-CM | POA: Insufficient documentation

## 2018-02-24 HISTORY — DX: Atherosclerotic heart disease of native coronary artery without angina pectoris: I25.10

## 2018-02-24 HISTORY — PX: CORONARY STENT INTERVENTION: CATH118234

## 2018-02-24 HISTORY — PX: RIGHT/LEFT HEART CATH AND CORONARY ANGIOGRAPHY: CATH118266

## 2018-02-24 LAB — POCT I-STAT 3, VENOUS BLOOD GAS (G3P V)
Acid-Base Excess: 2 mmol/L (ref 0.0–2.0)
BICARBONATE: 28.3 mmol/L — AB (ref 20.0–28.0)
O2 Saturation: 67 %
PCO2 VEN: 52.7 mmHg (ref 44.0–60.0)
PH VEN: 7.338 (ref 7.250–7.430)
PO2 VEN: 38 mmHg (ref 32.0–45.0)
TCO2: 30 mmol/L (ref 22–32)

## 2018-02-24 LAB — CBC
HEMATOCRIT: 35 % — AB (ref 39.0–52.0)
HEMOGLOBIN: 11.4 g/dL — AB (ref 13.0–17.0)
MCH: 29.2 pg (ref 26.0–34.0)
MCHC: 32.6 g/dL (ref 30.0–36.0)
MCV: 89.5 fL (ref 78.0–100.0)
Platelets: 168 10*3/uL (ref 150–400)
RBC: 3.91 MIL/uL — ABNORMAL LOW (ref 4.22–5.81)
RDW: 13.6 % (ref 11.5–15.5)
WBC: 4.1 10*3/uL (ref 4.0–10.5)

## 2018-02-24 LAB — BASIC METABOLIC PANEL
Anion gap: 7 (ref 5–15)
BUN: 13 mg/dL (ref 8–23)
CALCIUM: 9.2 mg/dL (ref 8.9–10.3)
CHLORIDE: 106 mmol/L (ref 98–111)
CO2: 29 mmol/L (ref 22–32)
Creatinine, Ser: 1.17 mg/dL (ref 0.61–1.24)
GFR calc Af Amer: >60 mL/min (ref >60)
GFR calc non Af Amer: 57 mL/min — ABNORMAL LOW (ref >60)
GLUCOSE: 87 mg/dL (ref 70–99)
POTASSIUM: 4.1 mmol/L (ref 3.5–5.1)
Sodium: 142 mmol/L (ref 135–145)

## 2018-02-24 LAB — POCT I-STAT 3, ART BLOOD GAS (G3+)
Bicarbonate: 26 mmol/L (ref 20.0–28.0)
O2 Saturation: 94 %
PCO2 ART: 46 mmHg (ref 32.0–48.0)
PH ART: 7.36 (ref 7.350–7.450)
PO2 ART: 77 mmHg — AB (ref 83.0–108.0)
TCO2: 27 mmol/L (ref 22–32)

## 2018-02-24 LAB — POCT ACTIVATED CLOTTING TIME: Activated Clotting Time: 351 seconds

## 2018-02-24 SURGERY — RIGHT/LEFT HEART CATH AND CORONARY ANGIOGRAPHY
Anesthesia: LOCAL

## 2018-02-24 MED ORDER — CLOPIDOGREL BISULFATE 300 MG PO TABS
ORAL_TABLET | ORAL | Status: DC | PRN
  Administered 2018-02-24: 600 mg via ORAL

## 2018-02-24 MED ORDER — HYDRALAZINE HCL 20 MG/ML IJ SOLN: 5.0000 mg | INTRAMUSCULAR | Status: AC | PRN

## 2018-02-24 MED ORDER — HEPARIN (PORCINE) IN NACL 1000-0.9 UT/500ML-% IV SOLN
INTRAVENOUS | Status: DC | PRN
  Administered 2018-02-24 (×2): 500 mL

## 2018-02-24 MED ORDER — LIDOCAINE HCL (PF) 1 % IJ SOLN
INTRAMUSCULAR | Status: AC
  Filled 2018-02-24: qty 30

## 2018-02-24 MED ORDER — ASPIRIN EC 81 MG PO TBEC: 81.0000 mg | DELAYED_RELEASE_TABLET | Freq: Every day | ORAL | 0 refills | Status: DC

## 2018-02-24 MED ORDER — NITROGLYCERIN 1 MG/10 ML FOR IR/CATH LAB
INTRA_ARTERIAL | Status: DC | PRN
  Administered 2018-02-24: 200 ug
  Administered 2018-02-24: 400 ug

## 2018-02-24 MED ORDER — LIDOCAINE HCL (PF) 1 % IJ SOLN
INTRAMUSCULAR | Status: DC | PRN
  Administered 2018-02-24: 2 mL

## 2018-02-24 MED ORDER — IOHEXOL 350 MG/ML SOLN
INTRAVENOUS | Status: DC | PRN
  Administered 2018-02-24: 115 mL via INTRAVENOUS

## 2018-02-24 MED ORDER — SODIUM CHLORIDE 0.9% FLUSH: 3.0000 mL | INTRAVENOUS | Status: DC | PRN

## 2018-02-24 MED ORDER — SODIUM CHLORIDE 0.9% FLUSH: 3.0000 mL | Freq: Two times a day (BID) | INTRAVENOUS | Status: DC

## 2018-02-24 MED ORDER — MIDAZOLAM HCL 2 MG/2ML IJ SOLN
INTRAMUSCULAR | Status: AC
  Filled 2018-02-24: qty 2

## 2018-02-24 MED ORDER — ASPIRIN 81 MG PO CHEW: 81.0000 mg | CHEWABLE_TABLET | Freq: Every day | ORAL | Status: DC

## 2018-02-24 MED ORDER — CLOPIDOGREL BISULFATE 75 MG PO TABS: 75.0000 mg | ORAL_TABLET | Freq: Every day | ORAL | 0 refills | Status: DC

## 2018-02-24 MED ORDER — SODIUM CHLORIDE 0.9 % WEIGHT BASED INFUSION: 1.0000 mL/kg/h | INTRAVENOUS | Status: DC

## 2018-02-24 MED ORDER — SODIUM CHLORIDE 0.9 % IV SOLN: 250.0000 mL | INTRAVENOUS | Status: DC | PRN

## 2018-02-24 MED ORDER — SODIUM CHLORIDE 0.9 % WEIGHT BASED INFUSION
3.0000 mL/kg/h | INTRAVENOUS | Status: AC
  Administered 2018-02-24: 3 mL/kg/h via INTRAVENOUS

## 2018-02-24 MED ORDER — ACETAMINOPHEN 325 MG PO TABS: 650.0000 mg | ORAL_TABLET | ORAL | Status: DC | PRN

## 2018-02-24 MED ORDER — LABETALOL HCL 5 MG/ML IV SOLN: 10.0000 mg | INTRAVENOUS | Status: AC | PRN

## 2018-02-24 MED ORDER — NITROGLYCERIN 0.4 MG SL SUBL: 0.4000 mg | SUBLINGUAL_TABLET | SUBLINGUAL | 2 refills | Status: DC | PRN

## 2018-02-24 MED ORDER — TIROFIBAN (AGGRASTAT) BOLUS VIA INFUSION
INTRAVENOUS | Status: DC | PRN
  Administered 2018-02-24: 2472.5 ug via INTRAVENOUS

## 2018-02-24 MED ORDER — SODIUM CHLORIDE 0.9 % IV SOLN: INTRAVENOUS | Status: AC

## 2018-02-24 MED ORDER — MIDAZOLAM HCL 2 MG/2ML IJ SOLN
INTRAMUSCULAR | Status: DC | PRN
  Administered 2018-02-24: 2 mg via INTRAVENOUS

## 2018-02-24 MED ORDER — VERAPAMIL HCL 2.5 MG/ML IV SOLN
INTRAVENOUS | Status: AC
  Filled 2018-02-24: qty 2

## 2018-02-24 MED ORDER — TIROFIBAN HCL IV 12.5 MG/250 ML
INTRAVENOUS | Status: AC
  Filled 2018-02-24: qty 250

## 2018-02-24 MED ORDER — ASPIRIN 81 MG PO CHEW
81.0000 mg | CHEWABLE_TABLET | ORAL | Status: AC
  Administered 2018-02-24: 81 mg via ORAL
  Filled 2018-02-24: qty 1

## 2018-02-24 MED ORDER — NITROGLYCERIN 1 MG/10 ML FOR IR/CATH LAB
INTRA_ARTERIAL | Status: AC
  Filled 2018-02-24: qty 10

## 2018-02-24 MED ORDER — VERAPAMIL HCL 2.5 MG/ML IV SOLN
INTRAVENOUS | Status: DC | PRN
  Administered 2018-02-24: 09:00:00 via INTRA_ARTERIAL

## 2018-02-24 MED ORDER — HEPARIN SODIUM (PORCINE) 1000 UNIT/ML IJ SOLN
INTRAMUSCULAR | Status: DC | PRN
  Administered 2018-02-24: 4000 [IU] via INTRAVENOUS
  Administered 2018-02-24: 5000 [IU] via INTRAVENOUS

## 2018-02-24 MED ORDER — HEPARIN SODIUM (PORCINE) 1000 UNIT/ML IJ SOLN
INTRAMUSCULAR | Status: AC
  Filled 2018-02-24: qty 1

## 2018-02-24 MED ORDER — ONDANSETRON HCL 4 MG/2ML IJ SOLN: 4.0000 mg | Freq: Four times a day (QID) | INTRAMUSCULAR | Status: DC | PRN

## 2018-02-24 MED ORDER — HEPARIN (PORCINE) IN NACL 1000-0.9 UT/500ML-% IV SOLN
INTRAVENOUS | Status: AC
  Filled 2018-02-24: qty 1000

## 2018-02-24 MED ORDER — FENTANYL CITRATE (PF) 100 MCG/2ML IJ SOLN
INTRAMUSCULAR | Status: AC
  Filled 2018-02-24: qty 2

## 2018-02-24 MED ORDER — CLOPIDOGREL BISULFATE 300 MG PO TABS
ORAL_TABLET | ORAL | Status: AC
  Filled 2018-02-24: qty 2

## 2018-02-24 MED ORDER — CLOPIDOGREL BISULFATE 75 MG PO TABS: 75.0000 mg | ORAL_TABLET | Freq: Every day | ORAL | Status: DC

## 2018-02-24 MED ORDER — FENTANYL CITRATE (PF) 100 MCG/2ML IJ SOLN
INTRAMUSCULAR | Status: DC | PRN
  Administered 2018-02-24: 25 ug via INTRAVENOUS

## 2018-02-24 MED FILL — CLOPIDOGREL 75 MG TABLET: 75 | 30 days supply | Qty: 30 | Fill #0

## 2018-02-24 SURGICAL SUPPLY — 23 items
BALLN SAPPHIRE 2.5X15 (BALLOONS) ×2
BALLN SAPPHIRE ~~LOC~~ 3.5X8 (BALLOONS) ×2 IMPLANT
BALLOON SAPPHIRE 2.5X15 (BALLOONS) ×1 IMPLANT
CATH BALLN WEDGE 5F 110CM (CATHETERS) ×2 IMPLANT
CATH IMPULSE 5F ANG/FL3.5 (CATHETERS) ×2 IMPLANT
CATH LANGSTON DUAL LUM PIG 6FR (CATHETERS) ×2 IMPLANT
CATH LAUNCHER 6FR EBU 3 (CATHETERS) ×2 IMPLANT
DEVICE RAD COMP TR BAND LRG (VASCULAR PRODUCTS) ×2 IMPLANT
GLIDESHEATH SLEND SS 6F .021 (SHEATH) ×2 IMPLANT
GUIDEWIRE .025 260CM (WIRE) ×2 IMPLANT
GUIDEWIRE INQWIRE 1.5J.035X260 (WIRE) ×1 IMPLANT
INQWIRE 1.5J .035X260CM (WIRE) ×2
KIT ENCORE 26 ADVANTAGE (KITS) ×2 IMPLANT
KIT HEART LEFT (KITS) ×2 IMPLANT
KIT HEMO VALVE WATCHDOG (MISCELLANEOUS) ×2 IMPLANT
PACK CARDIAC CATHETERIZATION (CUSTOM PROCEDURE TRAY) ×2 IMPLANT
SHEATH GLIDE SLENDER 4/5FR (SHEATH) ×2 IMPLANT
STENT SIERRA 3.50 X 15 MM (Permanent Stent) ×2 IMPLANT
TRANSDUCER W/STOPCOCK (MISCELLANEOUS) ×4 IMPLANT
TUBING ART PRESS 72  MALE/FEM (TUBING) ×1
TUBING ART PRESS 72 MALE/FEM (TUBING) ×1 IMPLANT
TUBING CIL FLEX 10 FLL-RA (TUBING) ×2 IMPLANT
WIRE ASAHI PROWATER 180CM (WIRE) ×2 IMPLANT

## 2018-02-24 NOTE — Discharge Summary (Addendum)
Discharge Summary    Patient ID: Steven [name],  MRN: [id number], DOB/AGE: [birth year] 83 y.o.  Admit date: 02/24/2018 Discharge date: 02/24/2018  Primary Care Provider: Hulan Fess Primary Cardiologist: Dr. Wynonia Lawman  Discharge Diagnoses    Active Problems:   Angina pectoris [place])   Nonrheumatic aortic valve stenosis   Allergies No Known Allergies  Diagnostic Studies/Procedures    Cath: 02/24/18   Ost RCA lesion is 25% stenosed.  Prox LAD lesion is 80% stenosed.  A drug-eluting stent was successfully placed using a STENT SIERRA 3.50 X 15 MM.  Post intervention, there is a 0% residual stenosis.  Mid LAD lesion is 40% stenosed.  Mid Cx lesion is 10% stenosed.  There is mild aortic valve stenosis.  The left ventricular systolic function is normal.  LV end diastolic pressure is mildly elevated. LVEDP 21 mm Hg.  The left ventricular ejection fraction is 55-65% by visual estimate.  CO 7.0 L/min; CI 3.1; PA pressure 36/15; mean 23 mm Hg; PA sat 67%, Ao sat 94%   Normal right heart pressures.  Mild aortic stenosis.   Recommend uninterrupted dual antiplatelet therapy with Aspirin 81mg  daily and Clopidogrel 75mg  daily for a minimum of 6 months (stable ischemic heart disease - Class I recommendation).   He will be considered for a short DAPT trial.    Plan for discharge later today. _____________   History of Present Illness     The patient was seen by Dr. Wynonia Lawman for a precath evaluation. The patient has a history of atrial flutter and had a previous atrial flutter ablation. He also has moderate aortic stenosis. He had a catheterization in 2002 because of chest discomfort and an abnormal stress test that did not show any significant CAD but had mild disease in the circumflex. He was recently admitted to the hospital with chest discomfort. He was admitted overnight and discharge. He was later followed back up in the office. The chest discomfort was somewhat  atypical. It was described as right-sided and pretty much is there all of the time. He was able to exercise and going to the gym and working out without difficulty and therefore deferred workup on him but he continued to have chest discomfort. An echocardiogram showed a peak gradient of 30 mm across the aortic valve. Myocardial perfusion scan done on 8:15 showed 3-4 mm of ST depression in the anterior leads with T wave changes in recovery associated with typical angina that radiated up into his neck at a low level of exercise. His myocardial perfusion images however did not show ischemia. Because of ongoing chest discomfort, abnormal response the exercise at a low level and continued chest pain catheterization was recommended to exclude global ischemia or severe critical disease. He was started on low-dose metoprolol as well as atorvastatin.   Hospital Course     Underwent cardiac cath noted above with successful PCI/DESx1 of the pLAD, mildly elevated LVEDP with normal LVEF. Plan for DAPT with ASA/plavix for at least 6 months. No complications noted post cath. Seen by cardiac rehab while in short stay. Instructions/precautions given prior to discharge. No BB given his bradycardia.   Belton Peplinski [place] was seen by Dr. Irish Lack and determined stable for discharge home. Follow up in the office has been arranged. Medications are listed below.   _____________  Discharge Vitals Blood pressure (!) 148/68, pulse (!) 53, temperature 98.8 F (37.1 C), temperature source Oral, resp. rate 18, height 6' (1.829 m), weight 98.9 kg, SpO2  100 %.  Filed Weights   02/24/18 0606  Weight: 98.9 kg    Labs & Radiologic Studies    CBC Recent Labs    02/24/18 0658  WBC 4.1  HGB 11.4*  HCT 35.0*  MCV 89.5  PLT 967   Basic Metabolic Panel Recent Labs    02/24/18 0658  NA 142  K 4.1  CL 106  CO2 29  GLUCOSE 87  BUN 13  CREATININE 1.17  CALCIUM 9.2   Liver Function Tests No results for input(s): AST, ALT,  ALKPHOS, BILITOT, PROT, ALBUMIN in the last 72 hours. No results for input(s): LIPASE, AMYLASE in the last 72 hours. Cardiac Enzymes No results for input(s): CKTOTAL, CKMB, CKMBINDEX, TROPONINI in the last 72 hours. BNP Invalid input(s): POCBNP D-Dimer No results for input(s): DDIMER in the last 72 hours. Hemoglobin A1C No results for input(s): HGBA1C in the last 72 hours. Fasting Lipid Panel No results for input(s): CHOL, HDL, LDLCALC, TRIG, CHOLHDL, LDLDIRECT in the last 72 hours. Thyroid Function Tests No results for input(s): TSH, T4TOTAL, T3FREE, THYROIDAB in the last 72 hours.  Invalid input(s): FREET3 _____________  No results found. Disposition   Pt is being discharged home today in good condition.  Follow-up Plans & Appointments    Follow-up Information    Jacolyn Reedy, MD Follow up.   Specialty:  Cardiology Why:  Please call the office for arrange for follow up within 2 weeks.  Contact information: [address] [place] [address] [phone]          Discharge Instructions    Amb Referral to Cardiac Rehabilitation   Complete by:  As directed    Diagnosis:   Coronary Stents PTCA         Discharge Medications     Medication List    STOP taking these medications   aspirin 81 MG chewable tablet Replaced by:  aspirin EC 81 MG tablet     TAKE these medications   aspirin EC 81 MG tablet Take 1 tablet (81 mg total) by mouth daily. Replaces:  aspirin 81 MG chewable tablet   atorvastatin 40 MG tablet Commonly known as:  LIPITOR Take 40 mg by mouth daily.   clopidogrel 75 MG tablet Commonly known as:  PLAVIX Take 1 tablet (75 mg total) by mouth daily.   metoprolol succinate 25 MG 24 hr tablet Commonly known as:  TOPROL-XL Take 25 mg by mouth daily.   nitroGLYCERIN 0.4 MG SL tablet Commonly known as:  NITROSTAT Place 1 tablet (0.4 mg total) under the tongue every 5 (five) minutes as needed.   VITAMIN B-12 PO Take  1 tablet by mouth daily.        Acute coronary syndrome (MI, NSTEMI, STEMI, etc) this admission?: No.     Outstanding Labs/Studies   N/a   Duration of Discharge Encounter   Greater than 30 minutes including physician time.  Signed, Reino Bellis NP-C 02/24/2018, 12:42 PM   I have examined the patient and reviewed assessment and plan and discussed with patient.  Agree with above as stated.  Right wrist stable.  No chest pain.  Plan for ASA and Plavix along with aggressive secondary prevention.  F/u with Dr. Wynonia Lawman.   Larae Grooms

## 2018-02-24 NOTE — Progress Notes (Signed)
Ed completed with pt and family. Good reception. Understands the importance of his Plavix/ASA. Encouraged increasing walking slowly to get back to gym (TM and bike). Will also refer to [place].  [place], ACSM 12:03 PM 02/24/2018

## 2018-02-24 NOTE — Interval H&P Note (Signed)
Cath Lab Visit (complete for each Cath Lab visit)  Clinical Evaluation Leading to the Procedure:   ACS: No.  Non-ACS:    Anginal Classification: CCS III  Anti-ischemic medical therapy: Minimal Therapy (1 class of medications)  Non-Invasive Test Results: Intermediate-risk stress test findings: cardiac mortality 1-3%/year  Prior CABG: No previous CABG      History and Physical Interval Note:  02/24/2018 8:33 AM  Steven [name]  has presented today for surgery, with the diagnosis of severe AS  The various methods of treatment have been discussed with the patient and family. After consideration of risks, benefits and other options for treatment, the patient has consented to  Procedure(s): RIGHT/LEFT HEART CATH AND CORONARY ANGIOGRAPHY (N/A) as a surgical intervention .  The patient's history has been reviewed, patient examined, no change in status, stable for surgery.  I have reviewed the patient's chart and labs.  Questions were answered to the patient's satisfaction.     Larae Grooms

## 2018-02-24 NOTE — Discharge Instructions (Signed)
**Note Kosei Rhodes-identified via Obfuscation** Radial Site Care °Refer to this sheet in the next few weeks. These instructions provide you with information about caring for yourself after your procedure. Your health care provider may also give you more specific instructions. Your treatment has been planned according to current medical practices, but problems sometimes occur. Call your health care provider if you have any problems or questions after your procedure. °What can I expect after the procedure? °After your procedure, it is typical to have the following: °· Bruising at the radial site that usually fades within 1-2 weeks. °· Blood collecting in the tissue (hematoma) that may be painful to the touch. It should usually decrease in size and tenderness within 1-2 weeks. ° °Follow these instructions at home: °· Take medicines only as directed by your health care provider. °· You may shower 24-48 hours after the procedure or as directed by your health care provider. Remove the bandage (dressing) and gently wash the site with plain soap and water. Pat the area dry with a clean towel. Do not rub the site, because this may cause bleeding. °· Do not take baths, swim, or use a hot tub until your health care provider approves. °· Check your insertion site every day for redness, swelling, or drainage. °· Do not apply powder or lotion to the site. °· Do not flex or bend the affected arm for 24 hours or as directed by your health care provider. °· Do not push or pull heavy objects with the affected arm for 24 hours or as directed by your health care provider. °· Do not lift over 10 lb (4.5 kg) for 5 days after your procedure or as directed by your health care provider. °· Ask your health care provider when it is okay to: °? Return to work or school. °? Resume usual physical activities or sports. °? Resume sexual activity. °· Do not drive home if you are discharged the same day as the procedure. Have someone else drive you. °· You may drive 24 hours after the procedure  unless otherwise instructed by your health care provider. °· Do not operate machinery or power tools for 24 hours after the procedure. °· If your procedure was done as an outpatient procedure, which means that you went home the same day as your procedure, a responsible adult should be with you for the first 24 hours after you arrive home. °· Keep all follow-up visits as directed by your health care provider. This is important. °Contact a health care provider if: °· You have a fever. °· You have chills. °· You have increased bleeding from the radial site. Hold pressure on the site. °Get help right away if: °· You have unusual pain at the radial site. °· You have redness, warmth, or swelling at the radial site. °· You have drainage (other than a small amount of blood on the dressing) from the radial site. °· The radial site is bleeding, and the bleeding does not stop after 30 minutes of holding steady pressure on the site. °· Your arm or hand becomes pale, cool, tingly, or numb. °This information is not intended to replace advice given to you by your health care provider. Make sure you discuss any questions you have with your health care provider. °Document Released: 07/18/2010 Document Revised: 11/21/2015 Document Reviewed: 01/01/2014 °Elsevier Interactive Patient Education © 2018 Elsevier Inc. ° °

## 2018-02-25 ENCOUNTER — Telehealth (HOSPITAL_COMMUNITY): Payer: Self-pay

## 2018-02-25 ENCOUNTER — Encounter (HOSPITAL_COMMUNITY): Payer: Self-pay | Admitting: Interventional Cardiology

## 2018-02-25 NOTE — Telephone Encounter (Signed)
Patients insurance is active and benefits verified through [facility] - $10.00 co-pay, no deductible, out of pocket amount of $3,400/$276.36 has been met, no co-insurance, and no pre-authorization is required. Spoke with [facility] reference [id number]  Will contact patient in regards to f/u appt and cardiac rehab. Patient will not be able to participate until f/u is scheduled and completed.

## 2018-02-25 NOTE — Telephone Encounter (Signed)
Called and spoke with patient in regards to Cardiac Rehab - patient stated he is not interested in the Cardiac Rehab program. Closed referral.

## 2018-03-01 MED FILL — Tirofiban HCl in NaCl 0.9% IV Soln 12.5 MG/250ML (Base Eq): INTRAVENOUS | Qty: 250 | Status: AC

## 2018-03-04 ENCOUNTER — Encounter: Payer: Self-pay | Admitting: Cardiology

## 2018-03-04 DIAGNOSIS — R0789 Other chest pain: Secondary | ICD-10-CM | POA: Diagnosis not present

## 2018-03-04 DIAGNOSIS — I359 Nonrheumatic aortic valve disorder, unspecified: Secondary | ICD-10-CM | POA: Diagnosis not present

## 2018-03-04 DIAGNOSIS — E668 Other obesity: Secondary | ICD-10-CM | POA: Diagnosis not present

## 2018-03-04 DIAGNOSIS — I483 Typical atrial flutter: Secondary | ICD-10-CM | POA: Diagnosis not present

## 2018-03-04 DIAGNOSIS — I119 Hypertensive heart disease without heart failure: Secondary | ICD-10-CM | POA: Diagnosis not present

## 2018-03-04 DIAGNOSIS — I2510 Atherosclerotic heart disease of native coronary artery without angina pectoris: Secondary | ICD-10-CM | POA: Diagnosis not present

## 2018-03-04 DIAGNOSIS — Z955 Presence of coronary angioplasty implant and graft: Secondary | ICD-10-CM | POA: Diagnosis not present

## 2018-03-04 DIAGNOSIS — I491 Atrial premature depolarization: Secondary | ICD-10-CM | POA: Diagnosis not present

## 2018-03-04 DIAGNOSIS — I350 Nonrheumatic aortic (valve) stenosis: Secondary | ICD-10-CM | POA: Diagnosis not present

## 2018-03-17 ENCOUNTER — Telehealth (HOSPITAL_COMMUNITY): Payer: Self-pay

## 2018-03-17 NOTE — Telephone Encounter (Signed)
Patient called and would like to know join CR. Patient will come in for orientation on 05/10/18 @ 8:15AM and will attend the 1:15PM exercise class.  Mailed homework package.

## 2018-04-01 DIAGNOSIS — R05 Cough: Secondary | ICD-10-CM | POA: Diagnosis not present

## 2018-04-11 ENCOUNTER — Encounter: Payer: Self-pay | Admitting: Cardiology

## 2018-04-11 DIAGNOSIS — E785 Hyperlipidemia, unspecified: Secondary | ICD-10-CM

## 2018-04-11 DIAGNOSIS — I119 Hypertensive heart disease without heart failure: Secondary | ICD-10-CM

## 2018-04-11 DIAGNOSIS — I2510 Atherosclerotic heart disease of native coronary artery without angina pectoris: Secondary | ICD-10-CM

## 2018-04-11 DIAGNOSIS — E669 Obesity, unspecified: Secondary | ICD-10-CM

## 2018-04-11 HISTORY — DX: Hypertensive heart disease without heart failure: I11.9

## 2018-04-11 HISTORY — DX: Hyperlipidemia, unspecified: E78.5

## 2018-04-11 HISTORY — DX: Obesity, unspecified: E66.9

## 2018-04-11 HISTORY — DX: Atherosclerotic heart disease of native coronary artery without angina pectoris: I25.10

## 2018-04-11 NOTE — Progress Notes (Signed)
Steven [name]  Date of visit:  03/04/2018 DOB:  December 13, 1934    Age:  83 yrs. Medical record number:  [id number]     Account number:  [id number] Primary Care Provider: Hulan [name] ____________________________ CURRENT DIAGNOSES  1. CAD Native without angina  2. Aortic valve stenosis  3. Atrial premature depolarization  4. Hypertensive heart disease without heart failure  5. Atrial flutter, typical  6. Obesity  7. Hyperlipidemia  8. Presence of coronary stent ____________________________ ALLERGIES  No Known Allergies ____________________________ MEDICATIONS  1. aspirin 81 mg chewable tablet, 1 p.o. daily  2. atorvastatin 40 mg tablet, 1 p.o. daily  3. clopidogrel 75 mg tablet, 1 p.o. daily  4. cyanocobalamin (vitamin B-12) 1,000 mcg capsule, 1 p.o. daily  5. Fish Oil 1,000 mg (120 mg-180 mg) capsule, 1 p.o. daily  6. metoprolol succinate ER 25 mg tablet,extended release 24 hr, 1 p.o. daily ____________________________ HISTORY OF PRESENT ILLNESS Patient returns for cardiac followup. He recently underwent cardiac catheterization one week ago by the radial approach was found to have a severe proximal LAD stenosis that was stented by Steven [name]. He went home the same day. He has not had any chest pain since he went home. [facility] He denies angina at this time and has no PND orthopnea syncope or palpitations. He had only mild aortic stenosis and there was minimal disease in the other vessels. He denies other cardiac symptoms. ____________________________ PAST HISTORY  Past Medical Illnesses:  hypertension, BPH, gout, peripheral edema, pneumonia, TMJ, obesity, cervical disc disease, cervical spondylosis;  Cardiovascular Illnesses:  aortic stenosis, atrial flutter, CAD;  Surgical Procedures:  cataract extraction OU, finger injury;  NYHA Classification:  I;  Canadian Angina Classification:  Class 0: Asymptomatic;  Cardiology Procedures-Invasive:  cardiac cath  (left) 2002, RF ablation for atrial flutter September 2017, cardiac cath (left) August 2019, stent August 2019;  Cardiology Procedures-Noninvasive:  treadmill, echocardiogram May 2016, echocardiogram October 2018, echocardiogram August 2019;  Cardiac Cath Results:  normal Left main, 80% stenosis proximal LAD, no significant disease RCA, 10 % stenosis CFX, mild aortic stenosis, Sierra 3.5 x 72mm to prox LAD Steven [name];  LVEF of 60% documented via nuclear study on 02/10/2018,   ____________________________ CARDIO-PULMONARY TEST DATES EKG Date:  03/04/2018;   Cardiac Cath Date:  02/24/2018;  Stent Placement Date: 02/24/2018;  Nuclear Study Date:  02/10/2018;  Echocardiography Date: 01/28/2018;  Chest Xray Date: 01/10/2018;   ____________________________ SOCIAL HISTORY Alcohol Use:  no alcohol use;  Smoking:  used to smoke but quit Prior to 1980;  Diet:  regular diet;  Lifestyle:  widower;  Exercise:  some exercise;  Occupation:  retired and [employer];  Residence:  lives with son;   ____________________________ PHYSICAL EXAMINATION VITAL SIGNS  Blood Pressure:  120/64 Sitting, Left arm, regular cuff  , 130/62 Standing, Left arm and regular cuff   Pulse:  60/min. Weight:  224.00 lbs. Height:  73"BMI: 29  Constitutional:  pleasant African Americian male in no acute distress Skin:  warm and dry to touch, no apparent skin lesions, or masses noted. Head:  normocephalic, balding male hair pattern Neck:  supple, without massess. No JVD, thyromegaly or carotid bruits. Carotid upstroke normal. Chest:  normal symmetry, clear to auscultation. Cardiac:  regular rhythm, normal S1 and S2, no S3 or S4, grade 2/6 systolic murmur at aortic area radiating to neck Peripheral Pulses:  the femoral,dorsalis pedis, and posterior tibial pulses are full and equal bilaterally  with no bruits auscultated. Extremities & Back:  no deformities, clubbing, cyanosis, erythema or edema observed. Normal muscle strength and tone.,  radial cath site clean and dry Neurological:  no gross motor or sensory deficits noted, affect appropriate, oriented x3. ____________________________ MOST RECENT LIPID PANEL 01/11/18  CHOL TOTL 174 mg/dl, LDL 103 NM, HDL 54 mg/dl, TRIGLYCER 85 mg/dl, CHOL/HDL 3.2 (Calc) and VLDL 17 ____________________________ IMPRESSIONS/PLAN  1. Recent coronary artery disease with proximal LAD disease treated with stenting resolution of angina 2. Mild to moderate aortic stenosis 3. Hypertensive heart disease 4. Hyperlipidemia under treatment 5. History of atrial flutter with prior ablation  Recommendations:  He is clinically doing well following his recent PCI. We discussed getting involved in rehabilitation and he was given a referral there. He is to continue aspirin and Plavix as well as his other medications. We discussed exercise at the [facility]. He should followup with me in 6 weeks. 12 lead EKG shows voltage for LVH but no significant ST changes.  ____________________________ TODAYS ORDERS  1. Lipid Panel: 6 weeks  2. 12 Lead EKG: Today                       ____________________________ Cardiology Physician:  Steven Hough MD [facility]

## 2018-04-17 ENCOUNTER — Other Ambulatory Visit: Payer: Self-pay | Admitting: Interventional Cardiology

## 2018-04-26 DIAGNOSIS — H40013 Open angle with borderline findings, low risk, bilateral: Secondary | ICD-10-CM | POA: Diagnosis not present

## 2018-04-26 DIAGNOSIS — H43813 Vitreous degeneration, bilateral: Secondary | ICD-10-CM | POA: Diagnosis not present

## 2018-04-26 DIAGNOSIS — H57813 Brow ptosis, bilateral: Secondary | ICD-10-CM | POA: Diagnosis not present

## 2018-04-26 DIAGNOSIS — H35033 Hypertensive retinopathy, bilateral: Secondary | ICD-10-CM | POA: Diagnosis not present

## 2018-04-26 NOTE — Progress Notes (Signed)
Cardiology Office Note:    Date:  04/27/2018   ID:  Steven [name], DOB 1934/12/09, MRN [id number]  PCP:  Hulan Fess, MD  Cardiologist:  Shirlee More, MD    Referring MD: Hulan Fess, MD    ASSESSMENT:    1. Coronary artery disease of native artery of native heart with stable angina pectoris ([place])   2. Hypertensive heart disease without CHF   3. Nonrheumatic aortic valve stenosis   4. CKD (chronic kidney disease), stage III ([place])   5. Atrial flutter, unspecified type ([place])   6. Hyperlipidemia, unspecified hyperlipidemia type    PLAN:    In order of problems listed above:  1. CAD is stable improved having no angina New [place] Heart Association class I continue his current medical treatment including dual antiplatelet aspirin Plavix beta-blocker and his high intensity statin 2. Stable blood pressure target continue beta-blocker 3. Stable asymptomatic need follow-up echocardiogram in 6 to 12 months 4. Recheck renal function 5. Stable maintaining sinus rhythm not anticoagulated 6. Stable continue statin and goal LDL is less than 70  25 minutes was spent in the visit and 50% or greater in education and coordination of care.  If Dr. Wynonia Lawman is unavailable to see him in 6 months I told him that he can come and see me in the office.  Next appointment: 6 months however he will be enrolled in our warfarin Pharm.D. clinic   Medication Adjustments/Labs and Tests Ordered: Current medicines are reviewed at length with the patient today.  Concerns regarding medicines are outlined above.  Orders Placed This Encounter  Procedures  . Lipid Profile  . Comprehensive Metabolic Panel (CMET)  . EKG 12-Lead   Meds ordered this encounter  Medications  . clopidogrel (PLAVIX) 75 MG tablet    Sig: Take 1 tablet (75 mg total) by mouth daily.    Dispense:  90 tablet    Refill:  1    Same Day PCI    No chief complaint on file.   History of Present Illness:    Steven [name] is a 83 y.o.  male with a hx of CAD with PCI and stent left anterior descending coronary artery 02/24/2018, mild aortic valve stenosis, atrial flutter with EP ablation September 2017, hypertension, hyperlipidemia and obesity last seen by Dr. Wynonia Lawman 03/04/2018. Compliance with diet, lifestyle and medications: Yes  He is improved after recent PCI and stent back to exercise and no recurrent angina on current treatment he is concerned because his peak exercise heart rate is in the range of 110-1 12 and I told him that his white worry should be with his age and CAD.  He is not in cardiac rehab.  He was unaware he had aortic stenosis I reviewed the diagnosis natural course of the disease and he will follow-up subsequently with Dr. Wynonia Lawman and need serial echocardiograms fortunately degree of aortic stenosis is not severe and he is asymptomatic.  He is compliant with medications including dual antiplatelet and I will check labs today including a CMP and lipid profile with his high intensity statin and recheck renal function with his CKD and recent contrast dye.  No edema orthopnea dyspnea palpitation or syncope. Past Medical History:  Diagnosis Date  . Atrial flutter ([place])   . CAD (coronary artery disease)    02/24/18 PCI/DES x1 to the pLAD  . Carpal tunnel syndrome   . Cataract    left eye  . Chest pain   . CKD (chronic kidney disease),  stage III ([place])   . Enlarged prostate   . History of gout   . History of kidney stones    pt has one now but not giving him any problems  . Numbness    both hands pt states pinched. 12-17-14 Gabapentin has improved.  . Pneumonia    history of pneumonia  . Psoriasis     Past Surgical History:  Procedure Laterality Date  . CATARACT EXTRACTION W/PHACO Left 03/15/2013   Procedure: CATARACT EXTRACTION PHACO AND INTRAOCULAR LENS PLACEMENT (IOC);  Surgeon: Adonis Brook, MD;  Location: [facility];  Service: Ophthalmology;  Laterality: Left;  . COLONOSCOPY    . COLONOSCOPY WITH PROPOFOL N/A  12/24/2014   Procedure: COLONOSCOPY WITH PROPOFOL;  Surgeon: Garlan Fair, MD;  Location: [facility];  Service: Endoscopy;  Laterality: N/A;  . CORONARY STENT INTERVENTION N/A 02/24/2018   Procedure: CORONARY STENT INTERVENTION;  Surgeon: Jettie Booze, MD;  Location: [facility];  Service: Cardiovascular;  Laterality: N/A;  . ELECTROPHYSIOLOGIC STUDY N/A 02/28/2016   Procedure: A-Flutter Ablation;  Surgeon: Deboraha Sprang, MD;  Location: [place] [facility];  Service: Cardiovascular;  Laterality: N/A;  . right cataract removed     . RIGHT/LEFT HEART CATH AND CORONARY ANGIOGRAPHY N/A 02/24/2018   Procedure: RIGHT/LEFT HEART CATH AND CORONARY ANGIOGRAPHY;  Surgeon: Jettie Booze, MD;  Location: [place] [facility];  Service: Cardiovascular;  Laterality: N/A;  . TONSILLECTOMY     as child    Current Medications: Current Meds  Medication Sig  . aspirin EC 81 MG tablet Take 1 tablet (81 mg total) by mouth daily.  Marland Kitchen atorvastatin (LIPITOR) 40 MG tablet Take 40 mg by mouth daily.  . clopidogrel (PLAVIX) 75 MG tablet Take 1 tablet (75 mg total) by mouth daily.  . Cyanocobalamin (VITAMIN B-12 PO) Take 1 tablet by mouth daily.  . metoprolol succinate (TOPROL-XL) 25 MG 24 hr tablet Take 25 mg by mouth daily.  . nitroGLYCERIN (NITROSTAT) 0.4 MG SL tablet Place 1 tablet (0.4 mg total) under the tongue every 5 (five) minutes as needed.  . [DISCONTINUED] clopidogrel (PLAVIX) 75 MG tablet Take 1 tablet (75 mg total) by mouth daily.     Allergies:   Patient has no known allergies.   Social History   Socioeconomic History  . Marital status: Widowed    Spouse name: Not on file  . Number of children: Not on file  . Years of education: Not on file  . Highest education level: Not on file  Occupational History  . Not on file  Social Needs  . Financial resource strain: Not on file  . Food insecurity:    Worry: Not on file    Inability: Not on file  . Transportation needs:     Medical: Not on file    Non-medical: Not on file  Tobacco Use  . Smoking status: Former Smoker    Types: Cigars  . Smokeless tobacco: Former Systems developer    Types: Chew  . Tobacco comment: smoked cigars 56yrs ago  Substance and Sexual Activity  . Alcohol use: No  . Drug use: No  . Sexual activity: Never  Lifestyle  . Physical activity:    Days per week: Not on file    Minutes per session: Not on file  . Stress: Not on file  Relationships  . Social connections:    Talks on phone: Not on file    Gets together: Not on file    Attends  religious service: Not on file    Active member of club or organization: Not on file    Attends meetings of clubs or organizations: Not on file    Relationship status: Not on file  Other Topics Concern  . Not on file  Social History Narrative  . Not on file     Family History: The patient's family history includes Diabetes in his son; Healthy in his father; Kidney failure in his sister and sister; Other in his mother. There is no history of Heart attack. ROS:   Please see the history of present illness.    All other systems reviewed and are negative.  EKGs/Labs/Other Studies Reviewed:    The following studies were reviewed today:  EKG:  EKG ordered today.  The ekg ordered today demonstrates sinus rhythm and is normal   Right and left heart cath 02/24/18 moderate AS mean gradient 26 mm Hg, AVA 1.7 cm2   Ost RCA lesion is 25% stenosed.  Prox LAD lesion is 80% stenosed.  A drug-eluting stent was successfully placed using a STENT SIERRA 3.50 X 15 MM.  Post intervention, there is a 0% residual stenosis.  Mid LAD lesion is 40% stenosed.  Mid Cx lesion is 10% stenosed.  The left ventricular systolic function is normal.  LV end diastolic pressure is mildly elevated. LVEDP 21 mm Hg.  The left ventricular ejection fraction is 55-65% by visual estimate.  Recent Labs: 02/24/2018: BUN 13; Creatinine, Ser 1.17; Hemoglobin 11.4; Platelets 168;  Potassium 4.1; Sodium 142  Recent Lipid Panel    Component Value Date/Time   CHOL 174 01/11/2018 0701   TRIG 85 01/11/2018 0701   HDL 54 01/11/2018 0701   CHOLHDL 3.2 01/11/2018 0701   VLDL 17 01/11/2018 0701   LDLCALC 103 (H) 01/11/2018 0701    Physical Exam:    VS:  BP 134/62 (BP Location: Right Arm, Patient Position: Sitting, Cuff Size: Large)   Pulse 60   Ht 6\' 1"  (1.854 m)   Wt 220 lb 12.8 oz (100.2 kg)   SpO2 97%   BMI 29.13 kg/m     Wt Readings from Last 3 Encounters:  04/27/18 220 lb 12.8 oz (100.2 kg)  02/24/18 218 lb (98.9 kg)  01/10/18 218 lb (98.9 kg)     GEN:  Well nourished, well developed in no acute distress HEENT: Normal NECK: No JVD; No carotid bruits LYMPHATICS: No lymphadenopathy CARDIAC: 2/6 harsh Sem aortic area s2 is normalRRR, no murmurs, rubs, gallops RESPIRATORY:  Clear to auscultation without rales, wheezing or rhonchi  ABDOMEN: Soft, non-tender, non-distended MUSCULOSKELETAL:  No edema; No deformity  SKIN: Warm and dry NEUROLOGIC:  Alert and oriented x 3 PSYCHIATRIC:  Normal affect    Signed, Shirlee More, MD  04/27/2018 11:23 AM    [facility]

## 2018-04-27 ENCOUNTER — Ambulatory Visit (INDEPENDENT_AMBULATORY_CARE_PROVIDER_SITE_OTHER): Payer: Medicare HMO | Admitting: Cardiology

## 2018-04-27 ENCOUNTER — Encounter: Payer: Self-pay | Admitting: Cardiology

## 2018-04-27 VITALS — BP 134/62 | HR 60 | Ht 73.0 in | Wt 220.8 lb

## 2018-04-27 DIAGNOSIS — I350 Nonrheumatic aortic (valve) stenosis: Secondary | ICD-10-CM | POA: Diagnosis not present

## 2018-04-27 DIAGNOSIS — E785 Hyperlipidemia, unspecified: Secondary | ICD-10-CM

## 2018-04-27 DIAGNOSIS — N183 Chronic kidney disease, stage 3 unspecified: Secondary | ICD-10-CM

## 2018-04-27 DIAGNOSIS — I25118 Atherosclerotic heart disease of native coronary artery with other forms of angina pectoris: Secondary | ICD-10-CM

## 2018-04-27 DIAGNOSIS — I4892 Unspecified atrial flutter: Secondary | ICD-10-CM

## 2018-04-27 DIAGNOSIS — I119 Hypertensive heart disease without heart failure: Secondary | ICD-10-CM

## 2018-04-27 MED ORDER — CLOPIDOGREL BISULFATE 75 MG PO TABS: 75.0000 mg | ORAL_TABLET | Freq: Every day | ORAL | 1 refills | Status: DC

## 2018-04-27 NOTE — Patient Instructions (Signed)
Medication Instructions:  Your physician recommends that you continue on your current medications as directed. Please refer to the Current Medication list given to you today.  If you need a refill on your cardiac medications before your next appointment, please call your pharmacy.   Lab work: Your physician recommends that you return for lab work today: CMP, Lipid panel.   If you have labs (blood work) drawn today and your tests are completely normal, you will receive your results only by: Marland Kitchen MyChart Message (if you have MyChart) OR . A paper copy in the mail If you have any lab test that is abnormal or we need to change your treatment, we will call you to review the results.  Testing/Procedures: You had an EKG today.   Follow-Up: At [facility], you and your health needs are our priority.  As part of our continuing mission to provide you with exceptional heart care, we have created designated Provider Care Teams.  These Care Teams include your primary Cardiologist (physician) and Advanced Practice Providers (APPs -  Physician Assistants and Nurse Practitioners) who all work together to provide you with the care you need, when you need it.  Your physician recommends that you schedule a follow-up appointment in 6 months with Dr. Wynonia Lawman.

## 2018-04-28 LAB — COMPREHENSIVE METABOLIC PANEL
A/G RATIO: 1.5 (ref 1.2–2.2)
ALBUMIN: 4.4 g/dL (ref 3.5–4.7)
ALK PHOS: 63 IU/L (ref 39–117)
ALT: 17 IU/L (ref 0–44)
AST: 24 IU/L (ref 0–40)
BILIRUBIN TOTAL: 0.3 mg/dL (ref 0.0–1.2)
BUN / CREAT RATIO: 15 (ref 10–24)
BUN: 16 mg/dL (ref 8–27)
CHLORIDE: 104 mmol/L (ref 96–106)
CO2: 25 mmol/L (ref 20–29)
Calcium: 9.3 mg/dL (ref 8.6–10.2)
Creatinine, Ser: 1.05 mg/dL (ref 0.76–1.27)
GFR calc Af Amer: 77 mL/min/{1.73_m2} (ref >59)
GFR calc non Af Amer: 66 mL/min/{1.73_m2} (ref >59)
GLUCOSE: 90 mg/dL (ref 65–99)
Globulin, Total: 2.9 g/dL (ref 1.5–4.5)
POTASSIUM: 4.6 mmol/L (ref 3.5–5.2)
Sodium: 143 mmol/L (ref 134–144)
Total Protein: 7.3 g/dL (ref 6.0–8.5)

## 2018-04-28 LAB — LIPID PANEL
CHOL/HDL RATIO: 2.5 ratio (ref 0.0–5.0)
Cholesterol, Total: 113 mg/dL (ref 100–199)
HDL: 46 mg/dL (ref >39)
LDL Calculated: 54 mg/dL (ref 0–99)
TRIGLYCERIDES: 64 mg/dL (ref 0–149)
VLDL CHOLESTEROL CAL: 13 mg/dL (ref 5–40)

## 2018-05-04 ENCOUNTER — Telehealth (HOSPITAL_COMMUNITY): Payer: Self-pay | Admitting: Pharmacist

## 2018-05-04 NOTE — Telephone Encounter (Signed)
Cardiac Rehab Medication Review by a Pharmacist  Does the patient  feel that his/her medications are working for him/her?  yes  Has the patient been experiencing any side effects to the medications prescribed?  no  Does the patient measure his/her own blood pressure or blood glucose at home?  no   Does the patient have any problems obtaining medications due to transportation or finances?   no  Understanding of regimen: good Understanding of indications: good Potential of compliance: good    Pharmacist comments: Patient reports having a blood pressure monitor at home but does not normally check blood pressure.   Gwenlyn Found, Sherian Rein D PGY1 Pharmacy Resident  Phone [phone] 05/04/2018   10:03 AM

## 2018-05-09 NOTE — Progress Notes (Signed)
Steven [name] 83 y.o. male DOB August 16, 1934 MRN [id number]       Nutrition  No diagnosis found. Past Medical History:  Diagnosis Date  . Atrial flutter ([place])   . CAD (coronary artery disease)    02/24/18 PCI/DES x1 to the pLAD  . Carpal tunnel syndrome   . Cataract    left eye  . Chest pain   . CKD (chronic kidney disease), stage III ([place])   . Enlarged prostate   . History of gout   . History of kidney stones    pt has one now but not giving him any problems  . Numbness    both hands pt states pinched. 12-17-14 Gabapentin has improved.  . Pneumonia    history of pneumonia  . Psoriasis    Meds reviewed.     Current Outpatient Medications (Cardiovascular):  .  atorvastatin (LIPITOR) 40 MG tablet, Take 40 mg by mouth daily. .  metoprolol succinate (TOPROL-XL) 25 MG 24 hr tablet, Take 25 mg by mouth daily. .  nitroGLYCERIN (NITROSTAT) 0.4 MG SL tablet, Place 1 tablet (0.4 mg total) under the tongue every 5 (five) minutes as needed.   Current Outpatient Medications (Analgesics):  .  aspirin EC 81 MG tablet, Take 1 tablet (81 mg total) by mouth daily.  Current Outpatient Medications (Hematological):  .  clopidogrel (PLAVIX) 75 MG tablet, Take 1 tablet (75 mg total) by mouth daily. .  Cyanocobalamin (VITAMIN B-12 PO), Take 1 tablet by mouth daily.    HT: Ht Readings from Last 1 Encounters:  04/27/18 6\' 1"  (1.854 m)    WT: Wt Readings from Last 5 Encounters:  04/27/18 220 lb 12.8 oz (100.2 kg)  02/24/18 218 lb (98.9 kg)  01/10/18 218 lb (98.9 kg)  08/16/16 225 lb (102.1 kg)  04/14/16 220 lb (99.8 kg)      BMI = 29.14 (04/27/18)  Current tobacco use? No       Labs:  Lipid Panel     Component Value Date/Time   CHOL 113 04/27/2018 1120   TRIG 64 04/27/2018 1120   HDL 46 04/27/2018 1120   CHOLHDL 2.5 04/27/2018 1120   CHOLHDL 3.2 01/11/2018 0701   VLDL 17 01/11/2018 0701   LDLCALC 54 04/27/2018 1120    Lab Results  Component Value Date   HGBA1C 5.8 (H)  11/25/2013   CBG (last 3)  No results for input(s): GLUCAP in the last 72 hours.  Nutrition Diagnosis ? Food-and nutrition-related knowledge deficit related to lack of exposure to information as related to diagnosis of: ? CVD  ? Overweight  related to excessive energy intake as evidenced by a BMI 29.14  Nutrition Goal(s):  ? To be determined  Plan:  Pt to attend nutrition classes ? Nutrition I ? Nutrition II ? Portion Distortion  Will provide client-centered nutrition education as part of interdisciplinary care.   Monitor and evaluate progress toward nutrition goal with team.  Laurina Bustle, MS, RD, LDN 05/09/2018 10:47 AM

## 2018-05-10 ENCOUNTER — Encounter (HOSPITAL_COMMUNITY)
Admission: RE | Admit: 2018-05-10 | Discharge: 2018-05-10 | Disposition: A | Discharge status: Home / Self Care | Payer: Medicare HMO | Source: Ambulatory Visit | Attending: Cardiology | Admitting: Cardiology

## 2018-05-10 ENCOUNTER — Encounter (HOSPITAL_COMMUNITY): Payer: Self-pay

## 2018-05-10 VITALS — Ht 71.5 in | Wt 221.6 lb

## 2018-05-10 DIAGNOSIS — Z955 Presence of coronary angioplasty implant and graft: Secondary | ICD-10-CM | POA: Insufficient documentation

## 2018-05-10 NOTE — Progress Notes (Signed)
Steven [name] 83 y.o. male DOB: Jan 15, 1935 MRN: [id number]      Nutrition Note  1. 02/24/2018 Stented coronary artery    Past Medical History:  Diagnosis Date  . Atrial flutter ([place])   . CAD (coronary artery disease)    02/24/18 PCI/DES x1 to the pLAD  . Carpal tunnel syndrome   . Cataract    left eye  . Chest pain   . CKD (chronic kidney disease), stage III ([place])   . Enlarged prostate   . History of gout   . History of kidney stones    pt has one now but not giving him any problems  . Numbness    both hands pt states pinched. 12-17-14 Gabapentin has improved.  . Pneumonia    history of pneumonia  . Psoriasis    Meds reviewed.    Current Outpatient Medications (Cardiovascular):  .  atorvastatin (LIPITOR) 40 MG tablet, Take 40 mg by mouth daily. .  metoprolol succinate (TOPROL-XL) 25 MG 24 hr tablet, Take 25 mg by mouth daily. .  nitroGLYCERIN (NITROSTAT) 0.4 MG SL tablet, Place 1 tablet (0.4 mg total) under the tongue every 5 (five) minutes as needed.   Current Outpatient Medications (Analgesics):  .  aspirin EC 81 MG tablet, Take 1 tablet (81 mg total) by mouth daily.  Current Outpatient Medications (Hematological):  .  clopidogrel (PLAVIX) 75 MG tablet, Take 1 tablet (75 mg total) by mouth daily. .  Cyanocobalamin (VITAMIN B-12 PO), Take 1 tablet by mouth daily.    HT: Ht Readings from Last 1 Encounters:  05/10/18 5' 11.5" (1.816 m)    WT: Wt Readings from Last 5 Encounters:  05/10/18 221 lb 9 oz (100.5 kg)  04/27/18 220 lb 12.8 oz (100.2 kg)  02/24/18 218 lb (98.9 kg)  01/10/18 218 lb (98.9 kg)  08/16/16 225 lb (102.1 kg)     Body mass index is 30.47 kg/m.   Current tobacco use? No  Labs:  Lipid Panel     Component Value Date/Time   CHOL 113 04/27/2018 1120   TRIG 64 04/27/2018 1120   HDL 46 04/27/2018 1120   CHOLHDL 2.5 04/27/2018 1120   CHOLHDL 3.2 01/11/2018 0701   VLDL 17 01/11/2018 0701   LDLCALC 54 04/27/2018 1120    Lab Results   Component Value Date   HGBA1C 5.8 (H) 11/25/2013   CBG (last 3)  No results for input(s): GLUCAP in the last 72 hours.  Nutrition Note Spoke with pt. Nutrition plan and goals reviewed with pt. Pt is following Step 1 of the Therapeutic Lifestyle Changes diet. Pt wants to learn how to eat heart healthy and how to eat for prediabetes. Heart healthy diabetic tips reviewed (label reading, how to build a healthy plate, portion sizes, eating frequently across the day). Reviewed the difference between kinds of carbohydrates and recommended the pt choose complex carbohydrates. Pt shared he does not cook for himself, daughter does majority of the cooking. Distributed recipes to try and RD contact information in case daughter has any questions. Per discussion, pt does not use canned/convenience foods often. Pt does not add salt to food. Pt does not eat out frequently. Pt expressed understanding of the information reviewed. Pt aware of nutrition education classes offered and would like to attend nutrition classes.  Nutrition Diagnosis ? Food-and nutrition-related knowledge deficit related to lack of exposure to information as related to diagnosis of: ? CVD ? Pre-diabetes ? Obese  I = 30-34.9 related to excessive  energy intake as evidenced by a Body mass index is 30.47 kg/m.  Nutrition Intervention ? Pt's individual nutrition plan and goals reviewed with pt. ? Pt given handouts for: ? Nutrition I class ? Nutrition II class  ? pre-diabetes  Nutrition Goal(s):  ? Pt to identify and limit food sources of saturated fat, trans fat, refined carbohydrates and sodium ? Pt to eat a small lunch meal instead of skipping lunch ? Pt able to name foods that affect blood glucose.   Plan:  ? Pt to attend nutrition classes ? Nutrition I ? Nutrition II ? Portion Distortion  ? Diabetes Blitz ? Diabetes Q & Ae determined ? Will provide client-centered nutrition education as part of interdisciplinary care ? Monitor and  evaluate progress toward nutrition goal with team.   Laurina Bustle, MS, RD, LDN 05/10/2018 10:28 AM

## 2018-05-10 NOTE — Progress Notes (Signed)
Cardiac Individual Treatment Plan  Patient Details  Name: Steven [name] MRN: [id number] Date of Birth: [birth year] Referring Provider:   Flowsheet Row CARDIAC REHAB PHASE II ORIENTATION from 05/10/2018 in [facility]  Referring Provider  Dr. Wynonia Lawman       Initial Encounter Date:  Flowsheet Row CARDIAC REHAB PHASE II ORIENTATION from 05/10/2018 in [place]  Date  05/10/18      Visit Diagnosis: 02/24/2018 Stented coronary artery  Patient's Home Medications on Admission:  Current Outpatient Medications:  .  aspirin EC 81 MG tablet, Take 1 tablet (81 mg total) by mouth daily., Disp: 30 tablet, Rfl: 0 .  atorvastatin (LIPITOR) 40 MG tablet, Take 40 mg by mouth daily., Disp: , Rfl: 12 .  clopidogrel (PLAVIX) 75 MG tablet, Take 1 tablet (75 mg total) by mouth daily., Disp: 90 tablet, Rfl: 1 .  Cyanocobalamin (VITAMIN B-12 PO), Take 1 tablet by mouth daily., Disp: , Rfl:  .  metoprolol succinate (TOPROL-XL) 25 MG 24 hr tablet, Take 25 mg by mouth daily., Disp: , Rfl: 12 .  nitroGLYCERIN (NITROSTAT) 0.4 MG SL tablet, Place 1 tablet (0.4 mg total) under the tongue every 5 (five) minutes as needed., Disp: 25 tablet, Rfl: 2  Past Medical History: Past Medical History:  Diagnosis Date  . Atrial flutter ([place])   . CAD (coronary artery disease)    02/24/18 PCI/DES x1 to the pLAD  . Carpal tunnel syndrome   . Cataract    left eye  . Chest pain   . CKD (chronic kidney disease), stage III ([place])   . Enlarged prostate   . History of gout   . History of kidney stones    pt has one now but not giving him any problems  . Numbness    both hands pt states pinched. 12-17-14 Gabapentin has improved.  . Pneumonia    history of pneumonia  . Psoriasis     Tobacco Use: Social History   Tobacco Use  Smoking Status Former Smoker  . Types: Cigars  Smokeless Tobacco Former Systems developer  . Types: Chew  Tobacco Comment   smoked cigars 18yr  ago    Labs: Recent Review Flowsheet Data    Labs for ITP Cardiac and Pulmonary Rehab Latest Ref Rng & Units 11/25/2013 01/11/2018 02/24/2018 02/24/2018 04/27/2018   Cholestrol 100 - 199 mg/dL - 174 - - 113   LDLCALC 0 - 99 mg/dL - 103(H) - - 54   HDL >39 mg/dL - 54 - - 46   Trlycerides 0 - 149 mg/dL - 85 - - 64   Hemoglobin A1c <5.7 % 5.8(H) - - - -   PHART 7.350 - 7.450 - - 7.360 - -   PCO2ART 32.0 - 48.0 mmHg - - 46.0 - -   HCO3 20.0 - 28.0 mmol/L - - 26.0 28.3(H) -   TCO2 22 - 32 mmol/L - - 27 30 -   O2SAT % - - 94.0 67.0 -      Capillary Blood Glucose: Lab Results  Component Value Date   GLUCAP 119 (H) 08/16/2016   GLUCAP 102 (H) 11/27/2013   GLUCAP 145 (H) 11/26/2013   GLUCAP 97 11/25/2013     Exercise Target Goals: Exercise Program Goal: Individual exercise prescription set using results from initial 6 min walk test and THRR while considering  patient's activity barriers and safety.   Exercise Prescription Goal: Initial exercise prescription builds to 30-45 minutes a day  of aerobic activity, 2-3 days per week.  Home exercise guidelines will be given to patient during program as part of exercise prescription that the participant will acknowledge.  Activity Barriers & Risk Stratification: Activity Barriers & Cardiac Risk Stratification - 05/10/18 1001    Activity Barriers & Cardiac Risk Stratification          Activity Barriers  Muscular Weakness;Deconditioning    Cardiac Risk Stratification  High           6 Minute Walk: 6 Minute Walk    6 Minute Walk    Row Name 05/10/18 0958   Phase  Initial   Distance  1575 feet   Walk Time  6 minutes   # of Rest Breaks  0   MPH  2.98   METS  2.83   RPE  11   VO2 Peak  9.9   Symptoms  No   Resting HR  78 bpm   Resting BP  132/74   Resting Oxygen Saturation   99 %   Exercise Oxygen Saturation  during 6 min walk  97 %   Max Ex. HR  119 bpm   Max Ex. BP  130/80   2 Minute Post BP  128/78          Oxygen  Initial Assessment:   Oxygen Re-Evaluation:   Oxygen Discharge (Final Oxygen Re-Evaluation):   Initial Exercise Prescription: Initial Exercise Prescription - 05/10/18 1000    Date of Initial Exercise RX and Referring Provider          Date  05/10/18    Referring Provider  Dr. Wynonia Lawman     Expected Discharge Date  08/19/18        Bike          Level  3    Minutes  10    METs  2.7        NuStep          Level  3    SPM  85    Minutes  10    METs  2.8        Track          Laps  11    Minutes  10    METs  2.91        Prescription Details          Frequency (times per week)  3    Duration  Progress to 30 minutes of continuous aerobic without signs/symptoms of physical distress        Intensity          THRR 40-80% of Max Heartrate  56-111    Ratings of Perceived Exertion  11-13        Progression          Progression  Continue to progress workloads to maintain intensity without signs/symptoms of physical distress.        Resistance Training          Training Prescription  Yes    Weight  4 lbs.    Reps  10-15           Perform Capillary Blood Glucose checks as needed.  Exercise Prescription Changes:   Exercise Comments:   Exercise Goals and Review: Exercise Goals    Exercise Goals    Row Name 05/10/18 1001   Increase Physical Activity  Yes   Intervention  Provide advice, education, support and counseling about physical activity/exercise needs.;Develop an individualized exercise prescription  for aerobic and resistive training based on initial evaluation findings, risk stratification, comorbidities and participant's personal goals.   Expected Outcomes  Short Term: Attend rehab on a regular basis to increase amount of physical activity.   Increase Strength and Stamina  Yes   Intervention  Provide advice, education, support and counseling about physical activity/exercise needs.;Develop an individualized exercise prescription for aerobic and  resistive training based on initial evaluation findings, risk stratification, comorbidities and participant's personal goals.   Expected Outcomes  Short Term: Increase workloads from initial exercise prescription for resistance, speed, and METs.   Able to understand and use rate of perceived exertion (RPE) scale  Yes   Intervention  Provide education and explanation on how to use RPE scale   Expected Outcomes  Short Term: Able to use RPE daily in rehab to express subjective intensity level;Long Term:  Able to use RPE to guide intensity level when exercising independently   Knowledge and understanding of Target Heart Rate Range (THRR)  Yes   Intervention  Provide education and explanation of THRR including how the numbers were predicted and where they are located for reference   Expected Outcomes  Short Term: Able to state/look up THRR;Long Term: Able to use THRR to govern intensity when exercising independently;Short Term: Able to use daily as guideline for intensity in rehab   Able to check pulse independently  Yes   Intervention  Provide education and demonstration on how to check pulse in carotid and radial arteries.;Review the importance of being able to check your own pulse for safety during independent exercise   Expected Outcomes  Short Term: Able to explain why pulse checking is important during independent exercise;Long Term: Able to check pulse independently and accurately   Understanding of Exercise Prescription  Yes   Intervention  Provide education, explanation, and written materials on patient's individual exercise prescription   Expected Outcomes  Short Term: Able to explain program exercise prescription;Long Term: Able to explain home exercise prescription to exercise independently          Exercise Goals Re-Evaluation :   Discharge Exercise Prescription (Final Exercise Prescription Changes):   Nutrition:  Target Goals: Understanding of nutrition guidelines, daily intake of  sodium 1500mg , cholesterol 200mg , calories 30% from fat and 7% or less from saturated fats, daily to have 5 or more servings of fruits and vegetables.  Biometrics: Pre Biometrics - 05/10/18 0959    Pre Biometrics          Height  5' 11.5" (1.816 m)    Weight  100.5 kg    Waist Circumference  42 inches    Hip Circumference  49 inches    Waist to Hip Ratio  0.86 %    BMI (Calculated)  30.47    Triceps Skinfold  25 mm    % Body Fat  31.7 %    Grip Strength  33 kg    Flexibility  13 in    Single Leg Stand  7.4 seconds            Nutrition Therapy Plan and Nutrition Goals: Nutrition Therapy & Goals - 05/10/18 1033    Nutrition Therapy          Diet  heart healthy, carb modified        Personal Nutrition Goals          Nutrition Goal  Pt to identify and limit food sources of saturated fat, trans fat, refined carbohydrates and sodium    Personal Goal #  2  Pt to eat a small lunch meal instead of skipping lunch    Personal Goal #3  Pt able to name foods that affect blood glucose.        Intervention Plan          Intervention  Prescribe, educate and counsel regarding individualized specific dietary modifications aiming towards targeted core components such as weight, hypertension, lipid management, diabetes, heart failure and other comorbidities.    Expected Outcomes  Short Term Goal: Understand basic principles of dietary content, such as calories, fat, sodium, cholesterol and nutrients.;Long Term Goal: Adherence to prescribed nutrition plan.           Nutrition Assessments: Nutrition Assessments - 05/10/18 1034    MEDFICTS Scores          Pre Score  42           Nutrition Goals Re-Evaluation:   Nutrition Goals Re-Evaluation:   Nutrition Goals Discharge (Final Nutrition Goals Re-Evaluation):   Psychosocial: Target Goals: Acknowledge presence or absence of significant depression and/or stress, maximize coping skills, provide positive support system.  Participant is able to verbalize types and ability to use techniques and skills needed for reducing stress and depression.  Initial Review & Psychosocial Screening: Initial Psych Review & Screening - 05/10/18 1213    Initial Review          Current issues with  None Identified        Family Dynamics          Good Support System?  Yes   FAMILY        Barriers          Psychosocial barriers to participate in program  There are no identifiable barriers or psychosocial needs.        Screening Interventions          Interventions  Encouraged to exercise           Quality of Life Scores: Quality of Life - 05/10/18 1004    Quality of Life          Select  Quality of Life          Scores of 19 and below usually indicate a poorer quality of life in these areas.  A difference of  2-3 points is a clinically meaningful difference.  A difference of 2-3 points in the total score of the Quality of Life Index has been associated with significant improvement in overall quality of life, self-image, physical symptoms, and general health in studies assessing change in quality of life.  PHQ-9: Recent Review Flowsheet Data    There is no flowsheet data to display.     Interpretation of Total Score  Total Score Depression Severity:  1-4 = Minimal depression, 5-9 = Mild depression, 10-14 = Moderate depression, 15-19 = Moderately severe depression, 20-27 = Severe depression   Psychosocial Evaluation and Intervention:   Psychosocial Re-Evaluation:   Psychosocial Discharge (Final Psychosocial Re-Evaluation):   Vocational Rehabilitation: Provide vocational rehab assistance to qualifying candidates.   Vocational Rehab Evaluation & Intervention: Vocational Rehab - 05/10/18 1214    Initial Vocational Rehab Evaluation & Intervention          Assessment shows need for Vocational Rehabilitation  No           Education: Education Goals: Education classes will be provided on a  weekly basis, covering required topics. Participant will state understanding/return demonstration of topics presented.  Learning Barriers/Preferences: Learning Barriers/Preferences - 05/10/18 1004  Learning Barriers/Preferences          Learning Barriers  Sight   Memory deficits.    Learning Preferences  Video;Pictoral           Education Topics: Count Your Pulse:  -Group instruction provided by verbal instruction, demonstration, patient participation and written materials to support subject.  Instructors address importance of being able to find your pulse and how to count your pulse when at home without a heart monitor.  Patients get hands on experience counting their pulse with staff help and individually.   Heart Attack, Angina, and Risk Factor Modification:  -Group instruction provided by verbal instruction, video, and written materials to support subject.  Instructors address signs and symptoms of angina and heart attacks.    Also discuss risk factors for heart disease and how to make changes to improve heart health risk factors.   Functional Fitness:  -Group instruction provided by verbal instruction, demonstration, patient participation, and written materials to support subject.  Instructors address safety measures for doing things around the house.  Discuss how to get up and down off the floor, how to pick things up properly, how to safely get out of a chair without assistance, and balance training.   Meditation and Mindfulness:  -Group instruction provided by verbal instruction, patient participation, and written materials to support subject.  Instructor addresses importance of mindfulness and meditation practice to help reduce stress and improve awareness.  Instructor also leads participants through a meditation exercise.    Stretching for Flexibility and Mobility:  -Group instruction provided by verbal instruction, patient participation, and written materials to support  subject.  Instructors lead participants through series of stretches that are designed to increase flexibility thus improving mobility.  These stretches are additional exercise for major muscle groups that are typically performed during regular warm up and cool down.   Hands Only CPR:  -Group verbal, video, and participation provides a basic overview of AHA guidelines for community CPR. Role-play of emergencies allow participants the opportunity to practice calling for help and chest compression technique with discussion of AED use.   Hypertension: -Group verbal and written instruction that provides a basic overview of hypertension including the most recent diagnostic guidelines, risk factor reduction with self-care instructions and medication management.    Nutrition I class: Heart Healthy Eating:  -Group instruction provided by PowerPoint slides, verbal discussion, and written materials to support subject matter. The instructor gives an explanation and review of the Therapeutic Lifestyle Changes diet recommendations, which includes a discussion on lipid goals, dietary fat, sodium, fiber, plant stanol/sterol esters, sugar, and the components of a well-balanced, healthy diet.   Nutrition II class: Lifestyle Skills:  -Group instruction provided by PowerPoint slides, verbal discussion, and written materials to support subject matter. The instructor gives an explanation and review of label reading, grocery shopping for heart health, heart healthy recipe modifications, and ways to make healthier choices when eating out.   Diabetes Question & Answer:  -Group instruction provided by PowerPoint slides, verbal discussion, and written materials to support subject matter. The instructor gives an explanation and review of diabetes co-morbidities, pre- and post-prandial blood glucose goals, pre-exercise blood glucose goals, signs, symptoms, and treatment of hypoglycemia and hyperglycemia, and foot care  basics.   Diabetes Blitz:  -Group instruction provided by PowerPoint slides, verbal discussion, and written materials to support subject matter. The instructor gives an explanation and review of the physiology behind type 1 and type 2 diabetes, diabetes medications and rational behind  using different medications, pre- and post-prandial blood glucose recommendations and Hemoglobin A1c goals, diabetes diet, and exercise including blood glucose guidelines for exercising safely.    Portion Distortion:  -Group instruction provided by PowerPoint slides, verbal discussion, written materials, and food models to support subject matter. The instructor gives an explanation of serving size versus portion size, changes in portions sizes over the last 20 years, and what consists of a serving from each food group.   Stress Management:  -Group instruction provided by verbal instruction, video, and written materials to support subject matter.  Instructors review role of stress in heart disease and how to cope with stress positively.     Exercising on Your Own:  -Group instruction provided by verbal instruction, power point, and written materials to support subject.  Instructors discuss benefits of exercise, components of exercise, frequency and intensity of exercise, and end points for exercise.  Also discuss use of nitroglycerin and activating EMS.  Review options of places to exercise outside of rehab.  Review guidelines for sex with heart disease.   Cardiac Drugs I:  -Group instruction provided by verbal instruction and written materials to support subject.  Instructor reviews cardiac drug classes: antiplatelets, anticoagulants, beta blockers, and statins.  Instructor discusses reasons, side effects, and lifestyle considerations for each drug class.   Cardiac Drugs II:  -Group instruction provided by verbal instruction and written materials to support subject.  Instructor reviews cardiac drug classes:  angiotensin converting enzyme inhibitors (ACE-I), angiotensin II receptor blockers (ARBs), nitrates, and calcium channel blockers.  Instructor discusses reasons, side effects, and lifestyle considerations for each drug class.   Anatomy and Physiology of the Circulatory System:  Group verbal and written instruction and models provide basic cardiac anatomy and physiology, with the coronary electrical and arterial systems. Review of: AMI, Angina, Valve disease, Heart Failure, Peripheral Artery Disease, Cardiac Arrhythmia, Pacemakers, and the ICD.   Other Education:  -Group or individual verbal, written, or video instructions that support the educational goals of the cardiac rehab program.   Holiday Eating Survival Tips:  -Group instruction provided by PowerPoint slides, verbal discussion, and written materials to support subject matter. The instructor gives patients tips, tricks, and techniques to help them not only survive but enjoy the holidays despite the onslaught of food that accompanies the holidays.   Knowledge Questionnaire Score: Knowledge Questionnaire Score - 05/10/18 0931    Knowledge Questionnaire Score          Pre Score  18/24           Core Components/Risk Factors/Patient Goals at Admission: Personal Goals and Risk Factors at Admission - 05/10/18 1005    Core Components/Risk Factors/Patient Goals on Admission           Weight Management  Yes;Weight Maintenance;Weight Loss    Intervention  Weight Management: Develop a combined nutrition and exercise program designed to reach desired caloric intake, while maintaining appropriate intake of nutrient and fiber, sodium and fats, and appropriate energy expenditure required for the weight goal.;Weight Management: Provide education and appropriate resources to help participant work on and attain dietary goals.;Weight Management/Obesity: Establish reasonable short term and long term weight goals.    Admit Weight  221 lb 9 oz (100.5  kg)    Expected Outcomes  Short Term: Continue to assess and modify interventions until short term weight is achieved;Long Term: Adherence to nutrition and physical activity/exercise program aimed toward attainment of established weight goal;Weight Maintenance: Understanding of the daily nutrition guidelines, which includes 25-35%  calories from fat, 7% or less cal from saturated fats, less than 200mg  cholesterol, less than 1.5gm of sodium, & 5 or more servings of fruits and vegetables daily;Weight Loss: Understanding of general recommendations for a balanced deficit meal plan, which promotes 1-2 lb weight loss per week and includes a negative energy balance of [phone] kcal/d;Understanding recommendations for meals to include 15-35% energy as protein, 25-35% energy from fat, 35-60% energy from carbohydrates, less than 200mg  of dietary cholesterol, 20-35 gm of total fiber daily;Understanding of distribution of calorie intake throughout the day with the consumption of 4-5 meals/snacks    Hypertension  Yes    Intervention  Provide education on lifestyle modifcations including regular physical activity/exercise, weight management, moderate sodium restriction and increased consumption of fresh fruit, vegetables, and low fat dairy, alcohol moderation, and smoking cessation.;Monitor prescription use compliance.    Expected Outcomes  Short Term: Continued assessment and intervention until BP is < 140/45mm HG in hypertensive participants. < 130/19mm HG in hypertensive participants with diabetes, heart failure or chronic kidney disease.;Long Term: Maintenance of blood pressure at goal levels.    Lipids  Yes    Intervention  Provide education and support for participant on nutrition & aerobic/resistive exercise along with prescribed medications to achieve LDL 70mg , HDL >40mg .    Expected Outcomes  Short Term: Participant states understanding of desired cholesterol values and is compliant with medications prescribed.  Participant is following exercise prescription and nutrition guidelines.;Long Term: Cholesterol controlled with medications as prescribed, with individualized exercise RX and with personalized nutrition plan. Value goals: LDL < 70mg , HDL > 40 mg.    Stress  Yes    Intervention  Offer individual and/or small group education and counseling on adjustment to heart disease, stress management and health-related lifestyle change. Teach and support self-help strategies.;Refer participants experiencing significant psychosocial distress to appropriate mental health specialists for further evaluation and treatment. When possible, include family members and significant others in education/counseling sessions.    Expected Outcomes  Short Term: Participant demonstrates changes in health-related behavior, relaxation and other stress management skills, ability to obtain effective social support, and compliance with psychotropic medications if prescribed.;Long Term: Emotional wellbeing is indicated by absence of clinically significant psychosocial distress or social isolation.           Core Components/Risk Factors/Patient Goals Review:    Core Components/Risk Factors/Patient Goals at Discharge (Final Review):    ITP Comments: ITP Comments    Row Name 05/10/18 0927   ITP Comments  Dr. Fransico Him, Medical Director       Comments: Patient attended orientation from [phone] to [phone] to review rules and guidelines for program. Completed 6 minute walk test, Intitial ITP, and exercise prescription.  VSS. Telemetry-sinus rhythm, frequent PVC,   Asymptomatic.  Andi Hence, RN, BSN Cardiac Pulmonary Rehab 05/10/18 12:16 PM

## 2018-05-16 ENCOUNTER — Encounter (HOSPITAL_COMMUNITY)
Admission: RE | Admit: 2018-05-16 | Discharge: 2018-05-16 | Disposition: A | Discharge status: Home / Self Care | Payer: Medicare HMO | Source: Ambulatory Visit | Attending: Cardiology | Admitting: Cardiology

## 2018-05-16 ENCOUNTER — Ambulatory Visit (HOSPITAL_COMMUNITY): Payer: Medicare HMO

## 2018-05-16 ENCOUNTER — Encounter (HOSPITAL_COMMUNITY): Payer: Self-pay

## 2018-05-16 DIAGNOSIS — Z955 Presence of coronary angioplasty implant and graft: Secondary | ICD-10-CM | POA: Diagnosis not present

## 2018-05-16 NOTE — Progress Notes (Signed)
Daily Session Note  Patient Details  Name: Steven [name] MRN: [id number] Date of Birth: 11-29-1934 Referring Provider:   Flowsheet Row CARDIAC REHAB PHASE II ORIENTATION from 05/10/2018 in [place]  Referring Provider  Dr. Wynonia Lawman       Encounter Date: 05/16/2018  Check In: Session Check In - 05/16/18 1433    Check-In          Supervising physician immediately available to respond to emergencies  [facility] immediately available    Physician(s)  Dr Maryland Pink    Location  [facility]    Staff Present  Andi Hence, RN, BSN;Brittany Durene Fruits, BS, ACSM CEP, Exercise Physiologist;Olinty Celesta Aver, MS, ACSM CEP, Exercise Physiologist;Maria Whitaker, RN, BSN    Medication changes reported      No    Fall or balance concerns reported     No    Tobacco Cessation  No Change    Warm-up and Cool-down  Performed as group-led instruction    Resistance Training Performed  Yes    VAD Patient?  No    PAD/SET Patient?  No        Pain Assessment          Currently in Pain?  No/denies           Capillary Blood Glucose: No results found for this or any previous visit (from the past 24 hour(s)).    Social History   Tobacco Use  Smoking Status Former Smoker  . Types: Cigars  Smokeless Tobacco Former Systems developer  . Types: Chew  Tobacco Comment   smoked cigars 34yr ago    Goals Met:  Exercise tolerated well  Goals Unmet:  Not Applicable  Comments: Pt started cardiac rehab today.  Pt tolerated light exercise without difficulty. VSS, telemetry-sinus rhythm,, asymptomatic.  Medication list reconciled. Pt denies barriers to medicaiton compliance.  PSYCHOSOCIAL ASSESSMENT:  PHQ-0. Pt exhibits positive coping skills, hopeful outlook with supportive family. No psychosocial needs identified at this time, no psychosocial interventions necessary.   Pt oriented to exercise equipment and routine.    Understanding verbalized.   Dr. TFransico Himis  [facility]

## 2018-05-18 ENCOUNTER — Encounter (HOSPITAL_COMMUNITY)
Admission: RE | Admit: 2018-05-18 | Discharge: 2018-05-18 | Disposition: A | Discharge status: Home / Self Care | Payer: Medicare HMO | Source: Ambulatory Visit | Attending: Cardiology | Admitting: Cardiology

## 2018-05-18 ENCOUNTER — Ambulatory Visit (HOSPITAL_COMMUNITY): Payer: Medicare HMO

## 2018-05-18 DIAGNOSIS — Z955 Presence of coronary angioplasty implant and graft: Secondary | ICD-10-CM | POA: Diagnosis not present

## 2018-05-19 ENCOUNTER — Encounter (HOSPITAL_COMMUNITY): Payer: Self-pay

## 2018-05-19 NOTE — Progress Notes (Signed)
Cardiac Individual Treatment Plan  Patient Details  Name: Steven [name] MRN: [id number] Date of Birth: June 25, 1935 Referring Provider:   Flowsheet Row CARDIAC REHAB PHASE II ORIENTATION from 05/10/2018 in [place]  Referring Provider  Dr. Wynonia Lawman       Initial Encounter Date:  Flowsheet Row CARDIAC REHAB PHASE II ORIENTATION from 05/10/2018 in [place]  Date  05/10/18      Visit Diagnosis: 02/24/2018 Stented coronary artery  Patient's Home Medications on Admission:  Current Outpatient Medications:  .  aspirin EC 81 MG tablet, Take 1 tablet (81 mg total) by mouth daily., Disp: 30 tablet, Rfl: 0 .  atorvastatin (LIPITOR) 40 MG tablet, Take 40 mg by mouth daily., Disp: , Rfl: 12 .  clopidogrel (PLAVIX) 75 MG tablet, Take 1 tablet (75 mg total) by mouth daily., Disp: 90 tablet, Rfl: 1 .  Cyanocobalamin (VITAMIN B-12 PO), Take 1 tablet by mouth daily., Disp: , Rfl:  .  metoprolol succinate (TOPROL-XL) 25 MG 24 hr tablet, Take 25 mg by mouth daily., Disp: , Rfl: 12 .  nitroGLYCERIN (NITROSTAT) 0.4 MG SL tablet, Place 1 tablet (0.4 mg total) under the tongue every 5 (five) minutes as needed., Disp: 25 tablet, Rfl: 2  Past Medical History: Past Medical History:  Diagnosis Date  . Atrial flutter ([place])   . CAD (coronary artery disease)    02/24/18 PCI/DES x1 to the pLAD  . Carpal tunnel syndrome   . Cataract    left eye  . Chest pain   . CKD (chronic kidney disease), stage III ([place])   . Enlarged prostate   . History of gout   . History of kidney stones    pt has one now but not giving him any problems  . Numbness    both hands pt states pinched. 12-17-14 Gabapentin has improved.  . Pneumonia    history of pneumonia  . Psoriasis     Tobacco Use: Social History   Tobacco Use  Smoking Status Former Smoker  . Types: Cigars  Smokeless Tobacco Former Systems developer  . Types: Chew  Tobacco Comment   smoked cigars 83yr  ago    Labs: Recent Review Flowsheet Data    Labs for ITP Cardiac and Pulmonary Rehab Latest Ref Rng & Units 11/25/2013 01/11/2018 02/24/2018 02/24/2018 04/27/2018   Cholestrol 100 - 199 mg/dL - 174 - - 113   LDLCALC 0 - 99 mg/dL - 103(H) - - 54   HDL >39 mg/dL - 54 - - 46   Trlycerides 0 - 149 mg/dL - 85 - - 64   Hemoglobin A1c <5.7 % 5.8(H) - - - -   PHART 7.350 - 7.450 - - 7.360 - -   PCO2ART 32.0 - 48.0 mmHg - - 46.0 - -   HCO3 20.0 - 28.0 mmol/L - - 26.0 28.3(H) -   TCO2 22 - 32 mmol/L - - 27 30 -   O2SAT % - - 94.0 67.0 -      Capillary Blood Glucose: Lab Results  Component Value Date   GLUCAP 119 (H) 08/16/2016   GLUCAP 102 (H) 11/27/2013   GLUCAP 145 (H) 11/26/2013   GLUCAP 97 11/25/2013     Exercise Target Goals: Exercise Program Goal: Individual exercise prescription set using results from initial 6 min walk test and THRR while considering  patient's activity barriers and safety.   Exercise Prescription Goal: Initial exercise prescription builds to 30-45 minutes a day  of aerobic activity, 2-3 days per week.  Home exercise guidelines will be given to patient during program as part of exercise prescription that the participant will acknowledge.  Activity Barriers & Risk Stratification: Activity Barriers & Cardiac Risk Stratification - 05/10/18 1001    Activity Barriers & Cardiac Risk Stratification          Activity Barriers  Muscular Weakness;Deconditioning    Cardiac Risk Stratification  High           6 Minute Walk: 6 Minute Walk    6 Minute Walk    Row Name 05/10/18 0958   Phase  Initial   Distance  1575 feet   Walk Time  6 minutes   # of Rest Breaks  0   MPH  2.98   METS  2.83   RPE  11   VO2 Peak  9.9   Symptoms  No   Resting HR  78 bpm   Resting BP  132/74   Resting Oxygen Saturation   99 %   Exercise Oxygen Saturation  during 6 min walk  97 %   Max Ex. HR  119 bpm   Max Ex. BP  130/80   2 Minute Post BP  128/78          Oxygen  Initial Assessment:   Oxygen Re-Evaluation:   Oxygen Discharge (Final Oxygen Re-Evaluation):   Initial Exercise Prescription: Initial Exercise Prescription - 05/10/18 1000    Date of Initial Exercise RX and Referring Provider          Date  05/10/18    Referring Provider  Dr. Wynonia Lawman     Expected Discharge Date  08/19/18        Bike          Level  3    Minutes  10    METs  2.7        NuStep          Level  3    SPM  85    Minutes  10    METs  2.8        Track          Laps  11    Minutes  10    METs  2.91        Prescription Details          Frequency (times per week)  3    Duration  Progress to 30 minutes of continuous aerobic without signs/symptoms of physical distress        Intensity          THRR 40-80% of Max Heartrate  56-111    Ratings of Perceived Exertion  11-13        Progression          Progression  Continue to progress workloads to maintain intensity without signs/symptoms of physical distress.        Resistance Training          Training Prescription  Yes    Weight  4 lbs.    Reps  10-15           Perform Capillary Blood Glucose checks as needed.  Exercise Prescription Changes: Exercise Prescription Changes    Response to Exercise    Row Name 05/17/18 1528   Blood Pressure (Admit)  100/60   Blood Pressure (Exercise)  158/80   Blood Pressure (Exit)  108/70   Heart Rate (Admit)  71 bpm   Heart Rate (  Exercise)  111 bpm   Heart Rate (Exit)  71 bpm   Rating of Perceived Exertion (Exercise)  11   Comments  Pt first day of exercise   Duration  Continue with 30 min of aerobic exercise without signs/symptoms of physical distress.   Intensity  THRR unchanged       Progression    Row Name 05/17/18 1528   Progression  Continue to progress workloads to maintain intensity without signs/symptoms of physical distress.   Average METs  2.67       Resistance Training    Row Name 05/17/18 1528   Training Prescription  Yes    Weight  4 lbs.   Reps  10-15   Time  [place] Name 05/17/18 1528   Level  3   Minutes  10   METs  2.7       NuStep    Row Name 05/17/18 1528   Level  3   SPM  85   Minutes  10   METs  2.4       Track    Row Name 05/17/18 1528   Laps  11   Minutes  10   METs  2.91          Exercise Comments: Exercise Comments    Row Name 05/18/18 1451   Exercise Comments  Reviewed METs and goals with PT. Plans to continue exercising at the [facility] in addition to cardiac rehab.       Exercise Goals and Review: Exercise Goals    Exercise Goals    Row Name 05/10/18 1001   Increase Physical Activity  Yes   Intervention  Provide advice, education, support and counseling about physical activity/exercise needs.;Develop an individualized exercise prescription for aerobic and resistive training based on initial evaluation findings, risk stratification, comorbidities and participant's personal goals.   Expected Outcomes  Short Term: Attend rehab on a regular basis to increase amount of physical activity.   Increase Strength and Stamina  Yes   Intervention  Provide advice, education, support and counseling about physical activity/exercise needs.;Develop an individualized exercise prescription for aerobic and resistive training based on initial evaluation findings, risk stratification, comorbidities and participant's personal goals.   Expected Outcomes  Short Term: Increase workloads from initial exercise prescription for resistance, speed, and METs.   Able to understand and use rate of perceived exertion (RPE) scale  Yes   Intervention  Provide education and explanation on how to use RPE scale   Expected Outcomes  Short Term: Able to use RPE daily in rehab to express subjective intensity level;Long Term:  Able to use RPE to guide intensity level when exercising independently   Knowledge and understanding of Target Heart Rate Range (THRR)  Yes   Intervention  Provide education  and explanation of THRR including how the numbers were predicted and where they are located for reference   Expected Outcomes  Short Term: Able to state/look up THRR;Long Term: Able to use THRR to govern intensity when exercising independently;Short Term: Able to use daily as guideline for intensity in rehab   Able to check pulse independently  Yes   Intervention  Provide education and demonstration on how to check pulse in carotid and radial arteries.;Review the importance of being able to check your own pulse for safety during independent exercise   Expected Outcomes  Short Term: Able to explain why pulse checking is important during independent exercise;Long  Term: Able to check pulse independently and accurately   Understanding of Exercise Prescription  Yes   Intervention  Provide education, explanation, and written materials on patient's individual exercise prescription   Expected Outcomes  Short Term: Able to explain program exercise prescription;Long Term: Able to explain home exercise prescription to exercise independently          Exercise Goals Re-Evaluation : Exercise Goals Re-Evaluation    Exercise Goal Re-Evaluation    Row Name 05/18/18 1448   Exercise Goals Review  Increase Physical Activity;Increase Strength and Stamina;Able to understand and use rate of perceived exertion (RPE) scale;Knowledge and understanding of Target Heart Rate Range (THRR);Understanding of Exercise Prescription;Able to check pulse independently   Comments  Reviewed METs and goals with Pt. Pt is new to the program and has a MET level of 2.67. Pt is currently going to the [facility] 2 days a week for 2 hours.    Expected Outcomes  Will continue to monitor and progress Pt as tolerated.           Discharge Exercise Prescription (Final Exercise Prescription Changes): Exercise Prescription Changes - 05/17/18 1528    Response to Exercise          Blood Pressure (Admit)  100/60    Blood Pressure (Exercise)  158/80     Blood Pressure (Exit)  108/70    Heart Rate (Admit)  71 bpm    Heart Rate (Exercise)  111 bpm    Heart Rate (Exit)  71 bpm    Rating of Perceived Exertion (Exercise)  11    Comments  Pt first day of exercise    Duration  Continue with 30 min of aerobic exercise without signs/symptoms of physical distress.    Intensity  THRR unchanged        Progression          Progression  Continue to progress workloads to maintain intensity without signs/symptoms of physical distress.    Average METs  2.67        Resistance Training          Training Prescription  Yes    Weight  4 lbs.    Reps  10-15    Time  10 Minutes        Bike          Level  3    Minutes  10    METs  2.7        NuStep          Level  3    SPM  85    Minutes  10    METs  2.4        Track          Laps  11    Minutes  10    METs  2.91           Nutrition:  Target Goals: Understanding of nutrition guidelines, daily intake of sodium <1550m, cholesterol <2066m calories 30% from fat and 7% or less from saturated fats, daily to have 5 or more servings of fruits and vegetables.  Biometrics: Pre Biometrics - 05/10/18 0959    Pre Biometrics          Height  5' 11.5" (1.816 m)    Weight  100.5 kg    Waist Circumference  42 inches    Hip Circumference  49 inches    Waist to Hip Ratio  0.86 %    BMI (Calculated)  30.47  Triceps Skinfold  25 mm    % Body Fat  31.7 %    Grip Strength  33 kg    Flexibility  13 in    Single Leg Stand  7.4 seconds            Nutrition Therapy Plan and Nutrition Goals: Nutrition Therapy & Goals - 05/10/18 1033    Nutrition Therapy          Diet  heart healthy, carb modified        Personal Nutrition Goals          Nutrition Goal  Pt to identify and limit food sources of saturated fat, trans fat, refined carbohydrates and sodium    Personal Goal #2  Pt to eat a small lunch meal instead of skipping lunch    Personal Goal #3  Pt able to name foods that  affect blood glucose.        Intervention Plan          Intervention  Prescribe, educate and counsel regarding individualized specific dietary modifications aiming towards targeted core components such as weight, hypertension, lipid management, diabetes, heart failure and other comorbidities.    Expected Outcomes  Short Term Goal: Understand basic principles of dietary content, such as calories, fat, sodium, cholesterol and nutrients.;Long Term Goal: Adherence to prescribed nutrition plan.           Nutrition Assessments: Nutrition Assessments - 05/10/18 1034    MEDFICTS Scores          Pre Score  42           Nutrition Goals Re-Evaluation:   Nutrition Goals Re-Evaluation:   Nutrition Goals Discharge (Final Nutrition Goals Re-Evaluation):   Psychosocial: Target Goals: Acknowledge presence or absence of significant depression and/or stress, maximize coping skills, provide positive support system. Participant is able to verbalize types and ability to use techniques and skills needed for reducing stress and depression.  Initial Review & Psychosocial Screening: Initial Psych Review & Screening - 05/10/18 1213    Initial Review          Current issues with  None Identified        Family Dynamics          Good Support System?  Yes   FAMILY        Barriers          Psychosocial barriers to participate in program  There are no identifiable barriers or psychosocial needs.        Screening Interventions          Interventions  Encouraged to exercise           Quality of Life Scores: Quality of Life - 05/10/18 1004    Quality of Life          Select  Quality of Life          Scores of 19 and below usually indicate a poorer quality of life in these areas.  A difference of  2-3 points is a clinically meaningful difference.  A difference of 2-3 points in the total score of the Quality of Life Index has been associated with significant improvement in overall  quality of life, self-image, physical symptoms, and general health in studies assessing change in quality of life.  PHQ-9: Recent Review Flowsheet Data    Depression screen [facility] 2/9 05/16/2018   Decreased Interest 0   Down, Depressed, Hopeless 0   PHQ - 2 Score 0  Interpretation of Total Score  Total Score Depression Severity:  1-4 = Minimal depression, 5-9 = Mild depression, 10-14 = Moderate depression, 15-19 = Moderately severe depression, 20-27 = Severe depression   Psychosocial Evaluation and Intervention: Psychosocial Evaluation - 05/16/18 1434    Psychosocial Evaluation & Interventions          Interventions  Encouraged to exercise with the program and follow exercise prescription    Comments  no psychosocial needs identified, no interventions necessary     Expected Outcomes  pt will exhibit positive outlook with good coping skills.    Continue Psychosocial Services   No Follow up required           Psychosocial Re-Evaluation: Psychosocial Re-Evaluation    Psychosocial Re-Evaluation    [place] Name 05/19/18 1216   Current issues with  None Identified   Comments  no psychosocial needs identified, no interventions necessary    Expected Outcomes  pt will exhibit positive outlook with good coping skills.    Interventions  Encouraged to attend Cardiac Rehabilitation for the exercise   Continue Psychosocial Services   No Follow up required          Psychosocial Discharge (Final Psychosocial Re-Evaluation): Psychosocial Re-Evaluation - 05/19/18 1216    Psychosocial Re-Evaluation          Current issues with  None Identified    Comments  no psychosocial needs identified, no interventions necessary     Expected Outcomes  pt will exhibit positive outlook with good coping skills.     Interventions  Encouraged to attend Cardiac Rehabilitation for the exercise    Continue Psychosocial Services   No Follow up required           Vocational Rehabilitation: Provide  vocational rehab assistance to qualifying candidates.   Vocational Rehab Evaluation & Intervention: Vocational Rehab - 05/10/18 1214    Initial Vocational Rehab Evaluation & Intervention          Assessment shows need for Vocational Rehabilitation  No           Education: Education Goals: Education classes will be provided on a weekly basis, covering required topics. Participant will state understanding/return demonstration of topics presented.  Learning Barriers/Preferences: Learning Barriers/Preferences - 05/10/18 1004    Learning Barriers/Preferences          Learning Barriers  Sight   Memory deficits.    Learning Preferences  Video;Pictoral           Education Topics: Count Your Pulse:  -Group instruction provided by verbal instruction, demonstration, patient participation and written materials to support subject.  Instructors address importance of being able to find your pulse and how to count your pulse when at home without a heart monitor.  Patients get hands on experience counting their pulse with staff help and individually.   Heart Attack, Angina, and Risk Factor Modification:  -Group instruction provided by verbal instruction, video, and written materials to support subject.  Instructors address signs and symptoms of angina and heart attacks.    Also discuss risk factors for heart disease and how to make changes to improve heart health risk factors.   Functional Fitness:  -Group instruction provided by verbal instruction, demonstration, patient participation, and written materials to support subject.  Instructors address safety measures for doing things around the house.  Discuss how to get up and down off the floor, how to pick things up properly, how to safely get out of a chair without assistance, and balance training.  Meditation and Mindfulness:  -Group instruction provided by verbal instruction, patient participation, and written materials to support  subject.  Instructor addresses importance of mindfulness and meditation practice to help reduce stress and improve awareness.  Instructor also leads participants through a meditation exercise.    Stretching for Flexibility and Mobility:  -Group instruction provided by verbal instruction, patient participation, and written materials to support subject.  Instructors lead participants through series of stretches that are designed to increase flexibility thus improving mobility.  These stretches are additional exercise for major muscle groups that are typically performed during regular warm up and cool down.   Hands Only CPR:  -Group verbal, video, and participation provides a basic overview of AHA guidelines for community CPR. Role-play of emergencies allow participants the opportunity to practice calling for help and chest compression technique with discussion of AED use.   Hypertension: -Group verbal and written instruction that provides a basic overview of hypertension including the most recent diagnostic guidelines, risk factor reduction with self-care instructions and medication management.    Nutrition I class: Heart Healthy Eating:  -Group instruction provided by PowerPoint slides, verbal discussion, and written materials to support subject matter. The instructor gives an explanation and review of the Therapeutic Lifestyle Changes diet recommendations, which includes a discussion on lipid goals, dietary fat, sodium, fiber, plant stanol/sterol esters, sugar, and the components of a well-balanced, healthy diet.   Nutrition II class: Lifestyle Skills:  -Group instruction provided by PowerPoint slides, verbal discussion, and written materials to support subject matter. The instructor gives an explanation and review of label reading, grocery shopping for heart health, heart healthy recipe modifications, and ways to make healthier choices when eating out.   Diabetes Question & Answer:  -Group  instruction provided by PowerPoint slides, verbal discussion, and written materials to support subject matter. The instructor gives an explanation and review of diabetes co-morbidities, pre- and post-prandial blood glucose goals, pre-exercise blood glucose goals, signs, symptoms, and treatment of hypoglycemia and hyperglycemia, and foot care basics.   Diabetes Blitz:  -Group instruction provided by PowerPoint slides, verbal discussion, and written materials to support subject matter. The instructor gives an explanation and review of the physiology behind type 1 and type 2 diabetes, diabetes medications and rational behind using different medications, pre- and post-prandial blood glucose recommendations and Hemoglobin A1c goals, diabetes diet, and exercise including blood glucose guidelines for exercising safely.    Portion Distortion:  -Group instruction provided by PowerPoint slides, verbal discussion, written materials, and food models to support subject matter. The instructor gives an explanation of serving size versus portion size, changes in portions sizes over the last 20 years, and what consists of a serving from each food group.   Stress Management:  -Group instruction provided by verbal instruction, video, and written materials to support subject matter.  Instructors review role of stress in heart disease and how to cope with stress positively.     Exercising on Your Own:  -Group instruction provided by verbal instruction, power point, and written materials to support subject.  Instructors discuss benefits of exercise, components of exercise, frequency and intensity of exercise, and end points for exercise.  Also discuss use of nitroglycerin and activating EMS.  Review options of places to exercise outside of rehab.  Review guidelines for sex with heart disease.   Cardiac Drugs I:  -Group instruction provided by verbal instruction and written materials to support subject.  Instructor  reviews cardiac drug classes: antiplatelets, anticoagulants, beta blockers, and statins.  Instructor discusses reasons, side effects, and lifestyle considerations for each drug class.   Cardiac Drugs II:  -Group instruction provided by verbal instruction and written materials to support subject.  Instructor reviews cardiac drug classes: angiotensin converting enzyme inhibitors (ACE-I), angiotensin II receptor blockers (ARBs), nitrates, and calcium channel blockers.  Instructor discusses reasons, side effects, and lifestyle considerations for each drug class.   Anatomy and Physiology of the Circulatory System:  Group verbal and written instruction and models provide basic cardiac anatomy and physiology, with the coronary electrical and arterial systems. Review of: AMI, Angina, Valve disease, Heart Failure, Peripheral Artery Disease, Cardiac Arrhythmia, Pacemakers, and the ICD.   Other Education:  -Group or individual verbal, written, or video instructions that support the educational goals of the cardiac rehab program.   Holiday Eating Survival Tips:  -Group instruction provided by PowerPoint slides, verbal discussion, and written materials to support subject matter. The instructor gives patients tips, tricks, and techniques to help them not only survive but enjoy the holidays despite the onslaught of food that accompanies the holidays.   Knowledge Questionnaire Score: Knowledge Questionnaire Score - 05/10/18 0931    Knowledge Questionnaire Score          Pre Score  18/24           Core Components/Risk Factors/Patient Goals at Admission: Personal Goals and Risk Factors at Admission - 05/10/18 1005    Core Components/Risk Factors/Patient Goals on Admission           Weight Management  Yes;Weight Maintenance;Weight Loss    Intervention  Weight Management: Develop a combined nutrition and exercise program designed to reach desired caloric intake, while maintaining appropriate intake of  nutrient and fiber, sodium and fats, and appropriate energy expenditure required for the weight goal.;Weight Management: Provide education and appropriate resources to help participant work on and attain dietary goals.;Weight Management/Obesity: Establish reasonable short term and long term weight goals.    Admit Weight  221 lb 9 oz (100.5 kg)    Expected Outcomes  Short Term: Continue to assess and modify interventions until short term weight is achieved;Long Term: Adherence to nutrition and physical activity/exercise program aimed toward attainment of established weight goal;Weight Maintenance: Understanding of the daily nutrition guidelines, which includes 25-35% calories from fat, 7% or less cal from saturated fats, less than 265m cholesterol, less than 1.5gm of sodium, & 5 or more servings of fruits and vegetables daily;Weight Loss: Understanding of general recommendations for a balanced deficit meal plan, which promotes 1-2 lb weight loss per week and includes a negative energy balance of [phone] kcal/d;Understanding recommendations for meals to include 15-35% energy as protein, 25-35% energy from fat, 35-60% energy from carbohydrates, less than 2087mof dietary cholesterol, 20-35 gm of total fiber daily;Understanding of distribution of calorie intake throughout the day with the consumption of 4-5 meals/snacks    Hypertension  Yes    Intervention  Provide education on lifestyle modifcations including regular physical activity/exercise, weight management, moderate sodium restriction and increased consumption of fresh fruit, vegetables, and low fat dairy, alcohol moderation, and smoking cessation.;Monitor prescription use compliance.    Expected Outcomes  Short Term: Continued assessment and intervention until BP is < 140/904mG in hypertensive participants. < 130/47m69m in hypertensive participants with diabetes, heart failure or chronic kidney disease.;Long Term: Maintenance of blood pressure at goal  levels.    Lipids  Yes    Intervention  Provide education and support for participant on nutrition & aerobic/resistive exercise along with prescribed  medications to achieve LDL <45m, HDL >442m    Expected Outcomes  Short Term: Participant states understanding of desired cholesterol values and is compliant with medications prescribed. Participant is following exercise prescription and nutrition guidelines.;Long Term: Cholesterol controlled with medications as prescribed, with individualized exercise RX and with personalized nutrition plan. Value goals: LDL < 7067mHDL > 40 mg.    Stress  Yes    Intervention  Offer individual and/or small group education and counseling on adjustment to heart disease, stress management and health-related lifestyle change. Teach and support self-help strategies.;Refer participants experiencing significant psychosocial distress to appropriate mental health specialists for further evaluation and treatment. When possible, include family members and significant others in education/counseling sessions.    Expected Outcomes  Short Term: Participant demonstrates changes in health-related behavior, relaxation and other stress management skills, ability to obtain effective social support, and compliance with psychotropic medications if prescribed.;Long Term: Emotional wellbeing is indicated by absence of clinically significant psychosocial distress or social isolation.           Core Components/Risk Factors/Patient Goals Review:  Goals and Risk Factor Review    Core Components/Risk Factors/Patient Goals Review    Row Name 05/16/18 1434   Personal Goals Review  Weight Management/Obesity;Hypertension;Lipids;Stress   Review  pt with multiple CAD RF demonstrates willingness to participate in CR activities. pt personal goals are to increase strength/stamina.    Expected Outcomes  pt will participate in CR exercise, nutrition and lifestyle modification opportunities.            Core Components/Risk Factors/Patient Goals at Discharge (Final Review):  Goals and Risk Factor Review - 05/16/18 1434    Core Components/Risk Factors/Patient Goals Review          Personal Goals Review  Weight Management/Obesity;Hypertension;Lipids;Stress    Review  pt with multiple CAD RF demonstrates willingness to participate in CR activities. pt personal goals are to increase strength/stamina.     Expected Outcomes  pt will participate in CR exercise, nutrition and lifestyle modification opportunities.            ITP Comments: ITP Comments    Row Name 05/10/18 [id number]/18/19 1432   ITP Comments  Dr. TraFransico Himedical Director   pt started group exercise. pt tolerated light activity without difficulty. pt oriented to exercise equipment and safety routine.       Comments:

## 2018-05-20 ENCOUNTER — Encounter (HOSPITAL_COMMUNITY)
Admission: RE | Admit: 2018-05-20 | Discharge: 2018-05-20 | Disposition: A | Discharge status: Home / Self Care | Payer: Medicare HMO | Source: Ambulatory Visit | Attending: Cardiology | Admitting: Cardiology

## 2018-05-20 ENCOUNTER — Ambulatory Visit (HOSPITAL_COMMUNITY): Payer: Medicare HMO

## 2018-05-20 DIAGNOSIS — Z955 Presence of coronary angioplasty implant and graft: Secondary | ICD-10-CM

## 2018-05-23 ENCOUNTER — Encounter (HOSPITAL_COMMUNITY)
Admission: RE | Admit: 2018-05-23 | Discharge: 2018-05-23 | Disposition: A | Discharge status: Home / Self Care | Payer: Medicare HMO | Source: Ambulatory Visit | Attending: Cardiology | Admitting: Cardiology

## 2018-05-23 ENCOUNTER — Ambulatory Visit (HOSPITAL_COMMUNITY): Payer: Medicare HMO

## 2018-05-23 DIAGNOSIS — Z955 Presence of coronary angioplasty implant and graft: Secondary | ICD-10-CM

## 2018-05-25 ENCOUNTER — Ambulatory Visit (HOSPITAL_COMMUNITY): Payer: Medicare HMO

## 2018-05-25 ENCOUNTER — Encounter (HOSPITAL_COMMUNITY): Payer: Medicare HMO

## 2018-05-27 ENCOUNTER — Encounter (HOSPITAL_COMMUNITY): Payer: Medicare HMO

## 2018-05-27 ENCOUNTER — Ambulatory Visit (HOSPITAL_COMMUNITY): Payer: Medicare HMO

## 2018-05-30 ENCOUNTER — Encounter (HOSPITAL_COMMUNITY)
Admission: RE | Admit: 2018-05-30 | Discharge: 2018-05-30 | Disposition: A | Discharge status: Home / Self Care | Payer: Medicare HMO | Source: Ambulatory Visit | Attending: Cardiology | Admitting: Cardiology

## 2018-05-30 ENCOUNTER — Ambulatory Visit (HOSPITAL_COMMUNITY): Payer: Medicare HMO

## 2018-05-30 DIAGNOSIS — Z955 Presence of coronary angioplasty implant and graft: Secondary | ICD-10-CM | POA: Insufficient documentation

## 2018-06-01 ENCOUNTER — Ambulatory Visit (HOSPITAL_COMMUNITY): Payer: Medicare HMO

## 2018-06-01 ENCOUNTER — Encounter (HOSPITAL_COMMUNITY)
Admission: RE | Admit: 2018-06-01 | Discharge: 2018-06-01 | Disposition: A | Discharge status: Home / Self Care | Payer: Medicare HMO | Source: Ambulatory Visit | Attending: Cardiology | Admitting: Cardiology

## 2018-06-01 DIAGNOSIS — Z955 Presence of coronary angioplasty implant and graft: Secondary | ICD-10-CM | POA: Diagnosis not present

## 2018-06-03 ENCOUNTER — Ambulatory Visit (HOSPITAL_COMMUNITY): Payer: Medicare HMO

## 2018-06-03 ENCOUNTER — Encounter (HOSPITAL_COMMUNITY)
Admission: RE | Admit: 2018-06-03 | Discharge: 2018-06-03 | Disposition: A | Discharge status: Home / Self Care | Payer: Medicare HMO | Source: Ambulatory Visit | Attending: Cardiology | Admitting: Cardiology

## 2018-06-03 DIAGNOSIS — Z955 Presence of coronary angioplasty implant and graft: Secondary | ICD-10-CM | POA: Diagnosis not present

## 2018-06-06 ENCOUNTER — Encounter (HOSPITAL_COMMUNITY)
Admission: RE | Admit: 2018-06-06 | Discharge: 2018-06-06 | Disposition: A | Discharge status: Home / Self Care | Payer: Medicare HMO | Source: Ambulatory Visit | Attending: Cardiology | Admitting: Cardiology

## 2018-06-06 ENCOUNTER — Ambulatory Visit (HOSPITAL_COMMUNITY): Payer: Medicare HMO

## 2018-06-06 DIAGNOSIS — Z955 Presence of coronary angioplasty implant and graft: Secondary | ICD-10-CM

## 2018-06-06 NOTE — Progress Notes (Signed)
Steven [name] 83 y.o. male Nutrition Note Spoke with pt. Nutrition plan and goals reviewed with pt. Pt is following heart healthy diet. Pt wants to learn how to eat heart healthy and how to eat for prediabetes. Heart healthy diabetic tips reviewed (label reading, how to build a healthy plate, portion sizes, eating frequently across the day). Pt shared he eats ~2 meals daily, does not snack. Reviewed the benefits of eating regularly across the day with patient and discussed setting up a schedule for eating. Reviewed the difference between kinds of carbohydrates and recommended the pt choose complex carbohydrates. Pt shared he does not cook for himself, daughter does majority of the cooking. Distributed recipes to try and RD contact information in case daughter has any questions. Per discussion, pt does not use canned/convenience foods often. Pt does not add salt to food. Pt does not eat out frequently. Pt expressed understanding of the information reviewed. Pt aware of nutrition education classes offered and would like to attend nutrition classes.  Lab Results  Component Value Date   HGBA1C 5.8 (H) 11/25/2013    Wt Readings from Last 3 Encounters:  05/10/18 221 lb 9 oz (100.5 kg)  04/27/18 220 lb 12.8 oz (100.2 kg)  02/24/18 218 lb (98.9 kg)    Nutrition Diagnosis  Food-and nutrition-related knowledge deficit related to lack of exposure to information as related to diagnosis of: ? CVD ? Pre-diabetes  Obese  I = 30-34.9 related to excessive energy intake as evidenced by a Body mass index is 30.47 kg/m.  Nutrition Intervention ? Pt's individual nutrition plan reviewed with pt. ? Benefits of adopting Heart Healthy diet discussed when [employer] reviewed.    Goal(s)  Pt to identify and limit food sources of saturated fat, trans fat, refined carbohydrates and sodium  Pt to eat a small lunch meal instead of skipping lunch  Pt able to name foods that affect blood glucose.  Plan:   Pt to  attend nutrition classes ? Nutrition I ? Nutrition II ? Portion Distortion   Will provide client-centered nutrition education as part of interdisciplinary care  Monitor and evaluate progress toward nutrition goal with team.    Laurina Bustle, MS, RD, LDN 06/06/2018 2:19 PM

## 2018-06-08 ENCOUNTER — Ambulatory Visit (HOSPITAL_COMMUNITY): Payer: Medicare HMO

## 2018-06-08 ENCOUNTER — Encounter (HOSPITAL_COMMUNITY)
Admission: RE | Admit: 2018-06-08 | Discharge: 2018-06-08 | Disposition: A | Discharge status: Home / Self Care | Payer: Medicare HMO | Source: Ambulatory Visit | Attending: Cardiology | Admitting: Cardiology

## 2018-06-08 DIAGNOSIS — Z955 Presence of coronary angioplasty implant and graft: Secondary | ICD-10-CM

## 2018-06-08 NOTE — Progress Notes (Signed)
Cardiac Individual Treatment Plan  Patient Details  Name: Steven [name] MRN: [id number] Date of Birth: [birth year] Referring Provider:   Flowsheet Row CARDIAC REHAB PHASE II ORIENTATION from 05/10/2018 in [place]  Referring Provider  Dr. Wynonia Lawman       Initial Encounter Date:  Flowsheet Row CARDIAC REHAB PHASE II ORIENTATION from 05/10/2018 in [place]  Date  05/10/18      Visit Diagnosis: 02/24/2018 Stented coronary artery  Patient's Home Medications on Admission:  Current Outpatient Medications:  .  aspirin EC 81 MG tablet, Take 1 tablet (81 mg total) by mouth daily., Disp: 30 tablet, Rfl: 0 .  atorvastatin (LIPITOR) 40 MG tablet, Take 40 mg by mouth daily., Disp: , Rfl: 12 .  clopidogrel (PLAVIX) 75 MG tablet, Take 1 tablet (75 mg total) by mouth daily., Disp: 90 tablet, Rfl: 1 .  Cyanocobalamin (VITAMIN B-12 PO), Take 1 tablet by mouth daily., Disp: , Rfl:  .  metoprolol succinate (TOPROL-XL) 25 MG 24 hr tablet, Take 25 mg by mouth daily., Disp: , Rfl: 12 .  nitroGLYCERIN (NITROSTAT) 0.4 MG SL tablet, Place 1 tablet (0.4 mg total) under the tongue every 5 (five) minutes as needed., Disp: 25 tablet, Rfl: 2  Past Medical History: Past Medical History:  Diagnosis Date  . Atrial flutter ([place])   . CAD (coronary artery disease)    02/24/18 PCI/DES x1 to the pLAD  . Carpal tunnel syndrome   . Cataract    left eye  . Chest pain   . CKD (chronic kidney disease), stage III ([place])   . Enlarged prostate   . History of gout   . History of kidney stones    pt has one now but not giving him any problems  . Numbness    both hands pt states pinched. 12-17-14 Gabapentin has improved.  . Pneumonia    history of pneumonia  . Psoriasis     Tobacco Use: Social History   Tobacco Use  Smoking Status Former Smoker  . Types: Cigars  Smokeless Tobacco Former Systems developer  . Types: Chew  Tobacco Comment   smoked cigars 18yr  ago    Labs: Recent Review Flowsheet Data    Labs for ITP Cardiac and Pulmonary Rehab Latest Ref Rng & Units 11/25/2013 01/11/2018 02/24/2018 02/24/2018 04/27/2018   Cholestrol 100 - 199 mg/dL - 174 - - 113   LDLCALC 0 - 99 mg/dL - 103(H) - - 54   HDL >39 mg/dL - 54 - - 46   Trlycerides 0 - 149 mg/dL - 85 - - 64   Hemoglobin A1c <5.7 % 5.8(H) - - - -   PHART 7.350 - 7.450 - - 7.360 - -   PCO2ART 32.0 - 48.0 mmHg - - 46.0 - -   HCO3 20.0 - 28.0 mmol/L - - 26.0 28.3(H) -   TCO2 22 - 32 mmol/L - - 27 30 -   O2SAT % - - 94.0 67.0 -      Capillary Blood Glucose: Lab Results  Component Value Date   GLUCAP 119 (H) 08/16/2016   GLUCAP 102 (H) 11/27/2013   GLUCAP 145 (H) 11/26/2013   GLUCAP 97 11/25/2013     Exercise Target Goals: Exercise Program Goal: Individual exercise prescription set using results from initial 6 min walk test and THRR while considering  patient's activity barriers and safety.   Exercise Prescription Goal: Initial exercise prescription builds to 30-45 minutes a day  of aerobic activity, 2-3 days per week.  Home exercise guidelines will be given to patient during program as part of exercise prescription that the participant will acknowledge.  Activity Barriers & Risk Stratification: Activity Barriers & Cardiac Risk Stratification - 05/10/18 1001    Activity Barriers & Cardiac Risk Stratification          Activity Barriers  Muscular Weakness;Deconditioning    Cardiac Risk Stratification  High           6 Minute Walk: 6 Minute Walk    6 Minute Walk    Row Name 05/10/18 0958   Phase  Initial   Distance  1575 feet   Walk Time  6 minutes   # of Rest Breaks  0   MPH  2.98   METS  2.83   RPE  11   VO2 Peak  9.9   Symptoms  No   Resting HR  78 bpm   Resting BP  132/74   Resting Oxygen Saturation   99 %   Exercise Oxygen Saturation  during 6 min walk  97 %   Max Ex. HR  119 bpm   Max Ex. BP  130/80   2 Minute Post BP  128/78          Oxygen  Initial Assessment:   Oxygen Re-Evaluation:   Oxygen Discharge (Final Oxygen Re-Evaluation):   Initial Exercise Prescription: Initial Exercise Prescription - 05/10/18 1000    Date of Initial Exercise RX and Referring Provider          Date  05/10/18    Referring Provider  Dr. Wynonia Lawman     Expected Discharge Date  08/19/18        Bike          Level  3    Minutes  10    METs  2.7        NuStep          Level  3    SPM  85    Minutes  10    METs  2.8        Track          Laps  11    Minutes  10    METs  2.91        Prescription Details          Frequency (times per week)  3    Duration  Progress to 30 minutes of continuous aerobic without signs/symptoms of physical distress        Intensity          THRR 40-80% of Max Heartrate  56-111    Ratings of Perceived Exertion  11-13        Progression          Progression  Continue to progress workloads to maintain intensity without signs/symptoms of physical distress.        Resistance Training          Training Prescription  Yes    Weight  4 lbs.    Reps  10-15           Perform Capillary Blood Glucose checks as needed.  Exercise Prescription Changes: Exercise Prescription Changes    Response to Exercise    Row Name 05/17/18 1528 05/30/18 1600 06/03/18 1000   Blood Pressure (Admit)  100/60  124/82  122/70   Blood Pressure (Exercise)  158/80  130/70  140/80   Blood Pressure (Exit)  108/70  122/70  120/68   Heart Rate (Admit)  71 bpm  70 bpm  84 bpm   Heart Rate (Exercise)  111 bpm  113 bpm  115 bpm   Heart Rate (Exit)  71 bpm  78 bpm  78 bpm   Rating of Perceived Exertion (Exercise)  _0 Comments  Pt first day of exercise  no documentation  no documentation   Duration  Continue with 30 min of aerobic exercise without signs/symptoms of physical distress.  Continue with 30 min of aerobic exercise without signs/symptoms of physical distress.  Continue with 30 min of aerobic exercise without  signs/symptoms of physical distress.   Intensity  THRR unchanged  THRR unchanged  THRR unchanged       Progression    Row Name 05/17/18 1528 05/30/18 1600 06/03/18 1000   Progression  Continue to progress workloads to maintain intensity without signs/symptoms of physical distress.  Continue to progress workloads to maintain intensity without signs/symptoms of physical distress.  Continue to progress workloads to maintain intensity without signs/symptoms of physical distress.   Average METs  2.67  2.95  3.2       Resistance Training    Row Name 05/17/18 1528 05/30/18 1600 06/03/18 1000   Training Prescription  Yes  Yes  No   Weight  4 lbs.  4 lbs.  no documentation   Reps  10-15  10-15  no documentation   Time  10 Minutes  10 Minutes  no documentation       [place] Name 05/17/18 1528 05/30/18 1600 06/03/18 1000   Level  _1 Minutes  _2 METs  2.7  2.7  3.57       NuStep    Row Name 05/17/18 1528 05/30/18 1600 06/03/18 1000   Level  _3 SPM  85  85  85   Minutes  _4 METs  2.4  3.4  2.7       Track    Row Name 05/17/18 1528 05/30/18 1600 06/03/18 1000   Laps  _5 Minutes  _6 METs  2.91  3.45  2.91       Home Exercise Plan    [place] Name 05/17/18 1528 05/30/18 1600 06/03/18 1000   Plans to continue exercise at  no documentation  Home (comment) Pt is exercising at the [facility].   Home (comment) Pt is exercising at the [facility].    Frequency  no documentation  Add 3 additional days to program exercise sessions.  Add 3 additional days to program exercise sessions.   Initial Home Exercises Provided  no documentation  05/30/18  05/30/18          Exercise Comments: Exercise Comments    Row Name 05/18/18 1451 05/30/18 1613 06/08/18 1018   Exercise Comments  Reviewed METs and goals with PT. Plans to continue exercising at the [facility] in addition to cardiac rehab.   Reviewed HEP with Pt. Pt was responsive and plans to continue exercising  at the [facility] 2-4 days per week for 30-45 minutes.   Reviewed METs and goals with Pt. Pt will continue going to the [facility] for exercise.       Exercise Goals and Review: Exercise Goals    Exercise Goals  [place] Name 05/10/18 1001   Increase Physical Activity  Yes   Intervention  Provide advice, education, support and counseling about physical activity/exercise needs.;Develop an individualized exercise prescription for aerobic and resistive training based on initial evaluation findings, risk stratification, comorbidities and participant's personal goals.   Expected Outcomes  Short Term: Attend rehab on a regular basis to increase amount of physical activity.   Increase Strength and Stamina  Yes   Intervention  Provide advice, education, support and counseling about physical activity/exercise needs.;Develop an individualized exercise prescription for aerobic and resistive training based on initial evaluation findings, risk stratification, comorbidities and participant's personal goals.   Expected Outcomes  Short Term: Increase workloads from initial exercise prescription for resistance, speed, and METs.   Able to understand and use rate of perceived exertion (RPE) scale  Yes   Intervention  Provide education and explanation on how to use RPE scale   Expected Outcomes  Short Term: Able to use RPE daily in rehab to express subjective intensity level;Long Term:  Able to use RPE to guide intensity level when exercising independently   Knowledge and understanding of Target Heart Rate Range (THRR)  Yes   Intervention  Provide education and explanation of THRR including how the numbers were predicted and where they are located for reference   Expected Outcomes  Short Term: Able to state/look up THRR;Long Term: Able to use THRR to govern intensity when exercising independently;Short Term: Able to use daily as guideline for intensity in rehab   Able to check pulse independently  Yes   Intervention  Provide  education and demonstration on how to check pulse in carotid and radial arteries.;Review the importance of being able to check your own pulse for safety during independent exercise   Expected Outcomes  Short Term: Able to explain why pulse checking is important during independent exercise;Long Term: Able to check pulse independently and accurately   Understanding of Exercise Prescription  Yes   Intervention  Provide education, explanation, and written materials on patient's individual exercise prescription   Expected Outcomes  Short Term: Able to explain program exercise prescription;Long Term: Able to explain home exercise prescription to exercise independently          Exercise Goals Re-Evaluation : Exercise Goals Re-Evaluation    Exercise Goal Re-Evaluation    Row Name 05/18/18 1448 05/30/18 1611 06/08/18 1017   Exercise Goals Review  Increase Physical Activity;Increase Strength and Stamina;Able to understand and use rate of perceived exertion (RPE) scale;Knowledge and understanding of Target Heart Rate Range (THRR);Understanding of Exercise Prescription;Able to check pulse independently  Increase Physical Activity;Increase Strength and Stamina;Able to understand and use rate of perceived exertion (RPE) scale;Knowledge and understanding of Target Heart Rate Range (THRR);Understanding of Exercise Prescription;Able to check pulse independently  Increase Physical Activity;Increase Strength and Stamina;Able to understand and use rate of perceived exertion (RPE) scale;Knowledge and understanding of Target Heart Rate Range (THRR);Understanding of Exercise Prescription;Able to check pulse independently   Comments  Reviewed METs and goals with Pt. Pt is new to the program and has a MET level of 2.67. Pt is currently going to the [facility] 2 days a week for 2 hours.   Reviewed HEP with Pt. Pt was responsive and understands THRR, exercise precautions, NTG use, end points of exercise, and weather precautions. Pt  plans on exercising at the [facility] in addition to cardiac reahb.   Reviewed METs and goals with Pt. MET level is 3.2 and continues to increase with each session. Pt exercises  at the [facility] in addition to Cardiac Rehab.    Expected Outcomes  Will continue to monitor and progress Pt as tolerated.   Will continue to monitor and progress Pt as tolerated.   Will continue to monitor and progress Pt as tolerated.           Discharge Exercise Prescription (Final Exercise Prescription Changes): Exercise Prescription Changes - 06/03/18 1000    Response to Exercise          Blood Pressure (Admit)  122/70    Blood Pressure (Exercise)  140/80    Blood Pressure (Exit)  120/68    Heart Rate (Admit)  84 bpm    Heart Rate (Exercise)  115 bpm    Heart Rate (Exit)  78 bpm    Rating of Perceived Exertion (Exercise)  11    Duration  Continue with 30 min of aerobic exercise without signs/symptoms of physical distress.    Intensity  THRR unchanged        Progression          Progression  Continue to progress workloads to maintain intensity without signs/symptoms of physical distress.    Average METs  3.2        Resistance Training          Training Prescription  No        Bike          Level  4    Minutes  10    METs  3.57        NuStep          Level  4    SPM  85    Minutes  10    METs  2.7        Track          Laps  13    Minutes  10    METs  2.91        Home Exercise Plan          Plans to continue exercise at  Home (comment)   Pt is exercising at the [facility].    Frequency  Add 3 additional days to program exercise sessions.    Initial Home Exercises Provided  05/30/18           Nutrition:  Target Goals: Understanding of nutrition guidelines, daily intake of sodium <1511m, cholesterol <2035m calories 30% from fat and 7% or less from saturated fats, daily to have 5 or more servings of fruits and vegetables.  Biometrics: Pre Biometrics - 05/10/18 0959    Pre Biometrics           Height  5' 11.5" (1.816 m)    Weight  100.5 kg    Waist Circumference  42 inches    Hip Circumference  49 inches    Waist to Hip Ratio  0.86 %    BMI (Calculated)  30.47    Triceps Skinfold  25 mm    % Body Fat  31.7 %    Grip Strength  33 kg    Flexibility  13 in    Single Leg Stand  7.4 seconds            Nutrition Therapy Plan and Nutrition Goals: Nutrition Therapy & Goals - 05/10/18 1033    Nutrition Therapy          Diet  heart healthy, carb modified        Personal Nutrition Goals  Nutrition Goal  Pt to identify and limit food sources of saturated fat, trans fat, refined carbohydrates and sodium    Personal Goal #2  Pt to eat a small lunch meal instead of skipping lunch    Personal Goal #3  Pt able to name foods that affect blood glucose.        Intervention Plan          Intervention  Prescribe, educate and counsel regarding individualized specific dietary modifications aiming towards targeted core components such as weight, hypertension, lipid management, diabetes, heart failure and other comorbidities.    Expected Outcomes  Short Term Goal: Understand basic principles of dietary content, such as calories, fat, sodium, cholesterol and nutrients.;Long Term Goal: Adherence to prescribed nutrition plan.           Nutrition Assessments: Nutrition Assessments - 05/10/18 1034    MEDFICTS Scores          Pre Score  42           Nutrition Goals Re-Evaluation:   Nutrition Goals Re-Evaluation:   Nutrition Goals Discharge (Final Nutrition Goals Re-Evaluation):   Psychosocial: Target Goals: Acknowledge presence or absence of significant depression and/or stress, maximize coping skills, provide positive support system. Participant is able to verbalize types and ability to use techniques and skills needed for reducing stress and depression.  Initial Review & Psychosocial Screening: Initial Psych Review & Screening - 05/10/18 1213    Initial  Review          Current issues with  None Identified        Family Dynamics          Good Support System?  Yes   FAMILY        Barriers          Psychosocial barriers to participate in program  There are no identifiable barriers or psychosocial needs.        Screening Interventions          Interventions  Encouraged to exercise           Quality of Life Scores: Quality of Life - 05/10/18 1004    Quality of Life          Select  Quality of Life          Scores of 19 and below usually indicate a poorer quality of life in these areas.  A difference of  2-3 points is a clinically meaningful difference.  A difference of 2-3 points in the total score of the Quality of Life Index has been associated with significant improvement in overall quality of life, self-image, physical symptoms, and general health in studies assessing change in quality of life.  PHQ-9: Recent Review Flowsheet Data    Depression screen [facility] 2/9 05/16/2018   Decreased Interest 0   Down, Depressed, Hopeless 0   PHQ - 2 Score 0     Interpretation of Total Score  Total Score Depression Severity:  1-4 = Minimal depression, 5-9 = Mild depression, 10-14 = Moderate depression, 15-19 = Moderately severe depression, 20-27 = Severe depression   Psychosocial Evaluation and Intervention: Psychosocial Evaluation - 05/16/18 1434    Psychosocial Evaluation & Interventions          Interventions  Encouraged to exercise with the program and follow exercise prescription    Comments  no psychosocial needs identified, no interventions necessary     Expected Outcomes  pt will exhibit positive outlook with good coping skills.  Continue Psychosocial Services   No Follow up required           Psychosocial Re-Evaluation: Psychosocial Re-Evaluation    Psychosocial Re-Evaluation    Row Name 05/19/18 1216 06/03/18 1537   Current issues with  None Identified  None Identified   Comments  no psychosocial needs  identified, no interventions necessary   no psychosocial needs identified, no interventions necessary    Expected Outcomes  pt will exhibit positive outlook with good coping skills.   pt will exhibit positive outlook with good coping skills.    Interventions  Encouraged to attend Cardiac Rehabilitation for the exercise  Encouraged to attend Cardiac Rehabilitation for the exercise   Continue Psychosocial Services   No Follow up required  No Follow up required          Psychosocial Discharge (Final Psychosocial Re-Evaluation): Psychosocial Re-Evaluation - 06/03/18 1537    Psychosocial Re-Evaluation          Current issues with  None Identified    Comments  no psychosocial needs identified, no interventions necessary     Expected Outcomes  pt will exhibit positive outlook with good coping skills.     Interventions  Encouraged to attend Cardiac Rehabilitation for the exercise    Continue Psychosocial Services   No Follow up required           Vocational Rehabilitation: Provide vocational rehab assistance to qualifying candidates.   Vocational Rehab Evaluation & Intervention: Vocational Rehab - 05/10/18 1214    Initial Vocational Rehab Evaluation & Intervention          Assessment shows need for Vocational Rehabilitation  No           Education: Education Goals: Education classes will be provided on a weekly basis, covering required topics. Participant will state understanding/return demonstration of topics presented.  Learning Barriers/Preferences: Learning Barriers/Preferences - 05/10/18 1004    Learning Barriers/Preferences          Learning Barriers  Sight   Memory deficits.    Learning Preferences  Video;Pictoral           Education Topics: Count Your Pulse:  -Group instruction provided by verbal instruction, demonstration, patient participation and written materials to support subject.  Instructors address importance of being able to find your pulse and how to  count your pulse when at home without a heart monitor.  Patients get hands on experience counting their pulse with staff help and individually.   Heart Attack, Angina, and Risk Factor Modification:  -Group instruction provided by verbal instruction, video, and written materials to support subject.  Instructors address signs and symptoms of angina and heart attacks.    Also discuss risk factors for heart disease and how to make changes to improve heart health risk factors. Flowsheet Row CARDIAC REHAB PHASE II EXERCISE from 06/08/2018 in [facility]  Date  06/08/18  Instruction Review Code  2- Demonstrated Understanding      Functional Fitness:  -Group instruction provided by verbal instruction, demonstration, patient participation, and written materials to support subject.  Instructors address safety measures for doing things around the house.  Discuss how to get up and down off the floor, how to pick things up properly, how to safely get out of a chair without assistance, and balance training.   Meditation and Mindfulness:  -Group instruction provided by verbal instruction, patient participation, and written materials to support subject.  Instructor addresses importance of mindfulness and meditation practice to  help reduce stress and improve awareness.  Instructor also leads participants through a meditation exercise.    Stretching for Flexibility and Mobility:  -Group instruction provided by verbal instruction, patient participation, and written materials to support subject.  Instructors lead participants through series of stretches that are designed to increase flexibility thus improving mobility.  These stretches are additional exercise for major muscle groups that are typically performed during regular warm up and cool down.   Hands Only CPR:  -Group verbal, video, and participation provides a basic overview of AHA guidelines for community CPR. Role-play of  emergencies allow participants the opportunity to practice calling for help and chest compression technique with discussion of AED use.   Hypertension: -Group verbal and written instruction that provides a basic overview of hypertension including the most recent diagnostic guidelines, risk factor reduction with self-care instructions and medication management.    Nutrition I class: Heart Healthy Eating:  -Group instruction provided by PowerPoint slides, verbal discussion, and written materials to support subject matter. The instructor gives an explanation and review of the Therapeutic Lifestyle Changes diet recommendations, which includes a discussion on lipid goals, dietary fat, sodium, fiber, plant stanol/sterol esters, sugar, and the components of a well-balanced, healthy diet.   Nutrition II class: Lifestyle Skills:  -Group instruction provided by PowerPoint slides, verbal discussion, and written materials to support subject matter. The instructor gives an explanation and review of label reading, grocery shopping for heart health, heart healthy recipe modifications, and ways to make healthier choices when eating out.   Diabetes Question & Answer:  -Group instruction provided by PowerPoint slides, verbal discussion, and written materials to support subject matter. The instructor gives an explanation and review of diabetes co-morbidities, pre- and post-prandial blood glucose goals, pre-exercise blood glucose goals, signs, symptoms, and treatment of hypoglycemia and hyperglycemia, and foot care basics.   Diabetes Blitz:  -Group instruction provided by PowerPoint slides, verbal discussion, and written materials to support subject matter. The instructor gives an explanation and review of the physiology behind type 1 and type 2 diabetes, diabetes medications and rational behind using different medications, pre- and post-prandial blood glucose recommendations and Hemoglobin A1c goals, diabetes  diet, and exercise including blood glucose guidelines for exercising safely.    Portion Distortion:  -Group instruction provided by PowerPoint slides, verbal discussion, written materials, and food models to support subject matter. The instructor gives an explanation of serving size versus portion size, changes in portions sizes over the last 20 years, and what consists of a serving from each food group.   Stress Management:  -Group instruction provided by verbal instruction, video, and written materials to support subject matter.  Instructors review role of stress in heart disease and how to cope with stress positively.     Exercising on Your Own:  -Group instruction provided by verbal instruction, power point, and written materials to support subject.  Instructors discuss benefits of exercise, components of exercise, frequency and intensity of exercise, and end points for exercise.  Also discuss use of nitroglycerin and activating EMS.  Review options of places to exercise outside of rehab.  Review guidelines for sex with heart disease.   Cardiac Drugs I:  -Group instruction provided by verbal instruction and written materials to support subject.  Instructor reviews cardiac drug classes: antiplatelets, anticoagulants, beta blockers, and statins.  Instructor discusses reasons, side effects, and lifestyle considerations for each drug class.   Cardiac Drugs II:  -Group instruction provided by verbal instruction and written materials to support  subject.  Instructor reviews cardiac drug classes: angiotensin converting enzyme inhibitors (ACE-I), angiotensin II receptor blockers (ARBs), nitrates, and calcium channel blockers.  Instructor discusses reasons, side effects, and lifestyle considerations for each drug class. Flowsheet Row CARDIAC REHAB PHASE II EXERCISE from 06/08/2018 in [place]  Date  06/01/18  Educator  Pharmacist  Instruction Review Code  2-  Demonstrated Understanding      Anatomy and Physiology of the Circulatory System:  Group verbal and written instruction and models provide basic cardiac anatomy and physiology, with the coronary electrical and arterial systems. Review of: AMI, Angina, Valve disease, Heart Failure, Peripheral Artery Disease, Cardiac Arrhythmia, Pacemakers, and the ICD.   Other Education:  -Group or individual verbal, written, or video instructions that support the educational goals of the cardiac rehab program.   Holiday Eating Survival Tips:  -Group instruction provided by PowerPoint slides, verbal discussion, and written materials to support subject matter. The instructor gives patients tips, tricks, and techniques to help them not only survive but enjoy the holidays despite the onslaught of food that accompanies the holidays.   Knowledge Questionnaire Score: Knowledge Questionnaire Score - 05/10/18 0931    Knowledge Questionnaire Score          Pre Score  18/24           Core Components/Risk Factors/Patient Goals at Admission: Personal Goals and Risk Factors at Admission - 05/10/18 1005    Core Components/Risk Factors/Patient Goals on Admission           Weight Management  Yes;Weight Maintenance;Weight Loss    Intervention  Weight Management: Develop a combined nutrition and exercise program designed to reach desired caloric intake, while maintaining appropriate intake of nutrient and fiber, sodium and fats, and appropriate energy expenditure required for the weight goal.;Weight Management: Provide education and appropriate resources to help participant work on and attain dietary goals.;Weight Management/Obesity: Establish reasonable short term and long term weight goals.    Admit Weight  221 lb 9 oz (100.5 kg)    Expected Outcomes  Short Term: Continue to assess and modify interventions until short term weight is achieved;Long Term: Adherence to nutrition and physical activity/exercise program  aimed toward attainment of established weight goal;Weight Maintenance: Understanding of the daily nutrition guidelines, which includes 25-35% calories from fat, 7% or less cal from saturated fats, less than 256m cholesterol, less than 1.5gm of sodium, & 5 or more servings of fruits and vegetables daily;Weight Loss: Understanding of general recommendations for a balanced deficit meal plan, which promotes 1-2 lb weight loss per week and includes a negative energy balance of [phone] kcal/d;Understanding recommendations for meals to include 15-35% energy as protein, 25-35% energy from fat, 35-60% energy from carbohydrates, less than 2020mof dietary cholesterol, 20-35 gm of total fiber daily;Understanding of distribution of calorie intake throughout the day with the consumption of 4-5 meals/snacks    Hypertension  Yes    Intervention  Provide education on lifestyle modifcations including regular physical activity/exercise, weight management, moderate sodium restriction and increased consumption of fresh fruit, vegetables, and low fat dairy, alcohol moderation, and smoking cessation.;Monitor prescription use compliance.    Expected Outcomes  Short Term: Continued assessment and intervention until BP is < 140/9069mG in hypertensive participants. < 130/7m13m in hypertensive participants with diabetes, heart failure or chronic kidney disease.;Long Term: Maintenance of blood pressure at goal levels.    Lipids  Yes    Intervention  Provide education and support for participant on nutrition &  aerobic/resistive exercise along with prescribed medications to achieve LDL <33m, HDL >433m    Expected Outcomes  Short Term: Participant states understanding of desired cholesterol values and is compliant with medications prescribed. Participant is following exercise prescription and nutrition guidelines.;Long Term: Cholesterol controlled with medications as prescribed, with individualized exercise RX and with personalized  nutrition plan. Value goals: LDL < 7048mHDL > 40 mg.    Stress  Yes    Intervention  Offer individual and/or small group education and counseling on adjustment to heart disease, stress management and health-related lifestyle change. Teach and support self-help strategies.;Refer participants experiencing significant psychosocial distress to appropriate mental health specialists for further evaluation and treatment. When possible, include family members and significant others in education/counseling sessions.    Expected Outcomes  Short Term: Participant demonstrates changes in health-related behavior, relaxation and other stress management skills, ability to obtain effective social support, and compliance with psychotropic medications if prescribed.;Long Term: Emotional wellbeing is indicated by absence of clinically significant psychosocial distress or social isolation.           Core Components/Risk Factors/Patient Goals Review:  Goals and Risk Factor Review    Core Components/Risk Factors/Patient Goals Review    Row Name 05/16/18 1434 06/03/18 1537   Personal Goals Review  Weight Management/Obesity;Hypertension;Lipids;Stress  Weight Management/Obesity;Hypertension;Lipids;Stress   Review  pt with multiple CAD RF demonstrates willingness to participate in CR activities. pt personal goals are to increase strength/stamina.   pt with multiple CAD RF demonstrates willingness to participate in CR activities. pt personal goals are to increase strength/stamina. pt c/o fatigue and DOE climbing stairs at home. pt will add stairs to walking track at CR.  [facility]     Expected Outcomes  pt will participate in CR exercise, nutrition and lifestyle modification opportunities.   pt will participate in CR exercise, nutrition and lifestyle modification opportunities.           Core Components/Risk Factors/Patient Goals at Discharge (Final Review):  Goals and Risk  Factor Review - 06/03/18 1537    Core Components/Risk Factors/Patient Goals Review          Personal Goals Review  Weight Management/Obesity;Hypertension;Lipids;Stress    Review  pt with multiple CAD RF demonstrates willingness to participate in CR activities. pt personal goals are to increase strength/stamina. pt c/o fatigue and DOE climbing stairs at home. pt will add stairs to walking track at CR.  [facility]      Expected Outcomes  pt will participate in CR exercise, nutrition and lifestyle modification opportunities.            ITP Comments: ITP Comments    Row Name 05/10/18 [id number]/18/19 1432 06/03/18 1536   ITP Comments  Dr. TraFransico Himedical Director   pt started group exercise. pt tolerated light activity without difficulty. pt oriented to exercise equipment and safety routine.   30 day ITP review. pt with good attendance and participation. pt demonstrates eagerness to participate in CR program.       Comments:

## 2018-06-08 NOTE — Progress Notes (Signed)
Daily Session Note  Patient Details  Name: Steven [name] MRN: [id number] Date of Birth: October 19, 1934 Referring Provider:   Flowsheet Row CARDIAC REHAB PHASE II ORIENTATION from 05/10/2018 in [place]  Referring Provider  Dr. Wynonia Lawman       Encounter Date: 06/08/2018  Check In: Session Check In - 06/08/18 1432    Check-In          Supervising physician immediately available to respond to emergencies  [facility] immediately available    Physician(s)  Dr Lonny Prude    Location  [facility]    Staff Present  Andi Hence, RN, Marga Melnick, RN, BSN;Brittany Vance, BS, ACSM CEP, Exercise Physiologist    Medication changes reported      No    Fall or balance concerns reported     No    Tobacco Cessation  No Change    Warm-up and Cool-down  Performed as group-led instruction    Resistance Training Performed  No    VAD Patient?  No    PAD/SET Patient?  No        Pain Assessment          Currently in Pain?  No/denies           Capillary Blood Glucose: No results found for this or any previous visit (from the past 24 hour(s)).    Social History   Tobacco Use  Smoking Status Former Smoker  . Types: Cigars  Smokeless Tobacco Former Systems developer  . Types: Chew  Tobacco Comment   smoked cigars 88yr ago    Goals Met:  Independence with exercise equipment  Goals Unmet:  HR- SVT. Highest HR: 176  Comments: pt had 30 second SVT while using nustep at cardiac rehab. Pt asymptomatic. BP:    120.64.  Exercise stopped.  Pt reports he missed his AM medicine this morning.  Pt advised to take medicine as prescribed upon arrival home today and delay evening dose by 2 hours to space medication out. Written and verbal instructions given. Pt and wife verbalized understanding.  PC to Dr. HCherlyn Cushingoffice to advise. Rhythm strips faxed for review.  JAndi Hence RN, BSN Cardiac Pulmonary Rehab 06/08/18 3:39 PM    Dr. TFransico Himis  [facility]

## 2018-06-08 NOTE — Progress Notes (Signed)
Daily Session Note  Patient Details  Name: Steven [name] MRN: [id number] Date of Birth: Oct 27, 1934 Referring Provider:   Flowsheet Row CARDIAC REHAB PHASE II ORIENTATION from 05/10/2018 in [place]  Referring Provider  Dr. Wynonia Lawman       Encounter Date: 06/08/2018  Check In: Session Check In - 06/08/18 1432    Check-In          Supervising physician immediately available to respond to emergencies  [facility] immediately available    Physician(s)  Dr Lonny Prude    Location  [facility]    Staff Present  Andi Hence, RN, Marga Melnick, RN, BSN;Brittany Vance, BS, ACSM CEP, Exercise Physiologist    Medication changes reported      No    Fall or balance concerns reported     No    Tobacco Cessation  No Change    Warm-up and Cool-down  Performed as group-led instruction    Resistance Training Performed  No    VAD Patient?  No    PAD/SET Patient?  No        Pain Assessment          Currently in Pain?  No/denies           Capillary Blood Glucose: No results found for this or any previous visit (from the past 24 hour(s)).    Social History   Tobacco Use  Smoking Status Former Smoker  . Types: Cigars  Smokeless Tobacco Former Systems developer  . Types: Chew  Tobacco Comment   smoked cigars 51yr ago    Goals Met:  Exercise tolerated well  Goals Unmet:  PVC  Comments: Telemetry- sinus rhythm, frequent PVC with couplets. Pt asymptomatic. Pt denies change in regimen. Strips faxed to Dr. MBettina Gaviafor review. JAndi Hence RN, BSN Cardiac Pulmonary Rehab 06/08/18 3:47 PM     Dr. TFransico Himis [facility]

## 2018-06-10 ENCOUNTER — Ambulatory Visit (HOSPITAL_COMMUNITY): Payer: Medicare HMO

## 2018-06-10 ENCOUNTER — Encounter (HOSPITAL_COMMUNITY)
Admission: RE | Admit: 2018-06-10 | Discharge: 2018-06-10 | Disposition: A | Discharge status: Home / Self Care | Payer: Medicare HMO | Source: Ambulatory Visit | Attending: Cardiology | Admitting: Cardiology

## 2018-06-10 DIAGNOSIS — Z955 Presence of coronary angioplasty implant and graft: Secondary | ICD-10-CM | POA: Diagnosis not present

## 2018-06-13 ENCOUNTER — Ambulatory Visit (HOSPITAL_COMMUNITY): Payer: Medicare HMO

## 2018-06-13 ENCOUNTER — Encounter (HOSPITAL_COMMUNITY)
Admission: RE | Admit: 2018-06-13 | Discharge: 2018-06-13 | Disposition: A | Discharge status: Home / Self Care | Payer: Medicare HMO | Source: Ambulatory Visit | Attending: Cardiology | Admitting: Cardiology

## 2018-06-13 DIAGNOSIS — Z955 Presence of coronary angioplasty implant and graft: Secondary | ICD-10-CM | POA: Diagnosis not present

## 2018-06-15 ENCOUNTER — Encounter (HOSPITAL_COMMUNITY)
Admission: RE | Admit: 2018-06-15 | Discharge: 2018-06-15 | Disposition: A | Discharge status: Home / Self Care | Payer: Medicare HMO | Source: Ambulatory Visit | Attending: Cardiology | Admitting: Cardiology

## 2018-06-15 ENCOUNTER — Ambulatory Visit (HOSPITAL_COMMUNITY): Payer: Medicare HMO

## 2018-06-15 DIAGNOSIS — Z955 Presence of coronary angioplasty implant and graft: Secondary | ICD-10-CM | POA: Diagnosis not present

## 2018-06-17 ENCOUNTER — Encounter (HOSPITAL_COMMUNITY)
Admission: RE | Admit: 2018-06-17 | Discharge: 2018-06-17 | Disposition: A | Discharge status: Home / Self Care | Payer: Medicare HMO | Source: Ambulatory Visit | Attending: Cardiology | Admitting: Cardiology

## 2018-06-17 ENCOUNTER — Ambulatory Visit (HOSPITAL_COMMUNITY): Payer: Medicare HMO

## 2018-06-17 DIAGNOSIS — Z955 Presence of coronary angioplasty implant and graft: Secondary | ICD-10-CM | POA: Diagnosis not present

## 2018-06-20 ENCOUNTER — Encounter (HOSPITAL_COMMUNITY)
Admission: RE | Admit: 2018-06-20 | Discharge: 2018-06-20 | Disposition: A | Discharge status: Home / Self Care | Payer: Medicare HMO | Source: Ambulatory Visit | Attending: Cardiology | Admitting: Cardiology

## 2018-06-20 ENCOUNTER — Ambulatory Visit (HOSPITAL_COMMUNITY): Payer: Medicare HMO

## 2018-06-20 DIAGNOSIS — Z955 Presence of coronary angioplasty implant and graft: Secondary | ICD-10-CM | POA: Diagnosis not present

## 2018-06-24 ENCOUNTER — Ambulatory Visit (HOSPITAL_COMMUNITY): Payer: Medicare HMO

## 2018-06-24 ENCOUNTER — Encounter (HOSPITAL_COMMUNITY)
Admission: RE | Admit: 2018-06-24 | Discharge: 2018-06-24 | Disposition: A | Discharge status: Home / Self Care | Payer: Medicare HMO | Source: Ambulatory Visit | Attending: Cardiology | Admitting: Cardiology

## 2018-06-24 DIAGNOSIS — Z955 Presence of coronary angioplasty implant and graft: Secondary | ICD-10-CM | POA: Diagnosis not present

## 2018-06-27 ENCOUNTER — Ambulatory Visit (HOSPITAL_COMMUNITY): Payer: Medicare HMO

## 2018-06-27 ENCOUNTER — Encounter (HOSPITAL_COMMUNITY)
Admission: RE | Admit: 2018-06-27 | Discharge: 2018-06-27 | Disposition: A | Discharge status: Home / Self Care | Payer: Medicare HMO | Source: Ambulatory Visit | Attending: Cardiology | Admitting: Cardiology

## 2018-06-27 DIAGNOSIS — Z955 Presence of coronary angioplasty implant and graft: Secondary | ICD-10-CM | POA: Diagnosis not present

## 2018-07-01 ENCOUNTER — Telehealth (HOSPITAL_COMMUNITY): Payer: Self-pay | Admitting: Cardiac Rehabilitation

## 2018-07-01 ENCOUNTER — Encounter (HOSPITAL_COMMUNITY)
Admission: RE | Admit: 2018-07-01 | Discharge: 2018-07-01 | Disposition: A | Discharge status: Home / Self Care | Payer: Medicare HMO | Source: Ambulatory Visit | Attending: Cardiology | Admitting: Cardiology

## 2018-07-01 ENCOUNTER — Encounter: Payer: Self-pay | Admitting: Cardiology

## 2018-07-01 ENCOUNTER — Ambulatory Visit (HOSPITAL_COMMUNITY): Payer: Medicare HMO

## 2018-07-01 ENCOUNTER — Telehealth: Payer: Self-pay | Admitting: Cardiology

## 2018-07-01 DIAGNOSIS — Z955 Presence of coronary angioplasty implant and graft: Secondary | ICD-10-CM | POA: Diagnosis not present

## 2018-07-01 NOTE — Telephone Encounter (Signed)
Just an FYI and also sent in papers in doctor box

## 2018-07-01 NOTE — Telephone Encounter (Signed)
-----   Message from Richardo Priest, MD sent at 06/20/2018  7:01 PM EST ----- Regarding: RE: cardiac rehab Yes please do ----- Message ----- From: Lowell Guitar, RN Sent: 06/20/2018   4:45 PM EST To: Richardo Priest, MD, Jeralyn Bennett Subject: cardiac rehab                                  Dear Dr. Bettina Gavia  Pt consistently goes over his target heart rate of 111 with exercise at cardiac rehab. Pt rates the exercise level as fairly light and would like to continue with current workloads and beyond.  Would it be possible to increase his THR to 135 which is 95% of PMH?  Otherwise, his vital signs are stable and he tolerates exercise very well.  Telemetry- sinus rhythm, occasional PVC with rare couplets.   Thank you, Andi Hence, RN, BSN Cardiac Pulmonary Rehab

## 2018-07-01 NOTE — Telephone Encounter (Signed)
Attempted to contact Mechele Claude with Cardiac Rehab at [phone] with no answer as the office has already closed at 4:30 pm. Will continue efforts.

## 2018-07-04 ENCOUNTER — Ambulatory Visit (HOSPITAL_COMMUNITY): Payer: Medicare HMO

## 2018-07-04 ENCOUNTER — Telehealth (HOSPITAL_COMMUNITY): Payer: Self-pay | Admitting: Cardiac Rehabilitation

## 2018-07-04 ENCOUNTER — Encounter (HOSPITAL_COMMUNITY): Payer: Medicare HMO

## 2018-07-04 NOTE — Telephone Encounter (Signed)
pc to assess reason for absence from cardiac rehab. Pt states he has sore throat and headache. Pt denies fever, cough, nasal congestion. Pt denies CP, dyspnea, tachypalpitations, etc.  Pt advised to contact PCP if symptoms persist or worsen.  Understanding verbalized.  Andi Hence, RN, BSN Cardiac Pulmonary Rehab

## 2018-07-05 ENCOUNTER — Telehealth: Payer: Self-pay | Admitting: *Deleted

## 2018-07-05 DIAGNOSIS — I4892 Unspecified atrial flutter: Secondary | ICD-10-CM

## 2018-07-05 DIAGNOSIS — I471 Supraventricular tachycardia: Secondary | ICD-10-CM

## 2018-07-05 NOTE — Telephone Encounter (Signed)
Left message for patient to return call to discuss wearing a ZIO monitor for 14 days after SVT episode at Cardiac Rehab last week.

## 2018-07-05 NOTE — Telephone Encounter (Signed)
Patient is agreeable to wearing a monitor and has been scheduled to come to the [facility] office on Thursday, 07/07/2018 at 1:30 pm to have the ZIO monitor placed. Patient verbalized understanding. No further questions.

## 2018-07-05 NOTE — Addendum Note (Signed)
Addended by: Austin Miles on: 07/05/2018 10:55 AM   Modules accepted: Orders

## 2018-07-05 NOTE — Telephone Encounter (Signed)
Please see most recent encounter

## 2018-07-06 ENCOUNTER — Ambulatory Visit (HOSPITAL_COMMUNITY): Payer: Medicare HMO

## 2018-07-06 ENCOUNTER — Encounter (HOSPITAL_COMMUNITY): Payer: Self-pay

## 2018-07-06 ENCOUNTER — Encounter (HOSPITAL_COMMUNITY)
Admission: RE | Admit: 2018-07-06 | Discharge: 2018-07-06 | Disposition: A | Discharge status: Home / Self Care | Payer: Medicare HMO | Source: Ambulatory Visit | Attending: Cardiology | Admitting: Cardiology

## 2018-07-06 DIAGNOSIS — Z955 Presence of coronary angioplasty implant and graft: Secondary | ICD-10-CM

## 2018-07-06 NOTE — Progress Notes (Signed)
Cardiac Individual Treatment Plan  Patient Details  Name: Steven [name] MRN: [id number] Date of Birth: [birth year] Referring Provider:   Flowsheet Row CARDIAC REHAB PHASE II ORIENTATION from 05/10/2018 in [place]  Referring Provider  Dr. Wynonia Lawman       Initial Encounter Date:  Flowsheet Row CARDIAC REHAB PHASE II ORIENTATION from 05/10/2018 in [place]  Date  05/10/18      Visit Diagnosis: 02/24/2018 Stented coronary artery  Patient's Home Medications on Admission:  Current Outpatient Medications:  .  aspirin EC 81 MG tablet, Take 1 tablet (81 mg total) by mouth daily., Disp: 30 tablet, Rfl: 0 .  atorvastatin (LIPITOR) 40 MG tablet, Take 40 mg by mouth daily., Disp: , Rfl: 12 .  clopidogrel (PLAVIX) 75 MG tablet, Take 1 tablet (75 mg total) by mouth daily., Disp: 90 tablet, Rfl: 1 .  Cyanocobalamin (VITAMIN B-12 PO), Take 1 tablet by mouth daily., Disp: , Rfl:  .  metoprolol succinate (TOPROL-XL) 25 MG 24 hr tablet, Take 25 mg by mouth daily., Disp: , Rfl: 12 .  nitroGLYCERIN (NITROSTAT) 0.4 MG SL tablet, Place 1 tablet (0.4 mg total) under the tongue every 5 (five) minutes as needed., Disp: 25 tablet, Rfl: 2  Past Medical History: Past Medical History:  Diagnosis Date  . Atrial flutter ([place])   . CAD (coronary artery disease)    02/24/18 PCI/DES x1 to the pLAD  . Carpal tunnel syndrome   . Cataract    left eye  . Chest pain   . CKD (chronic kidney disease), stage III ([place])   . Enlarged prostate   . History of gout   . History of kidney stones    pt has one now but not giving him any problems  . Numbness    both hands pt states pinched. 12-17-14 Gabapentin has improved.  . Pneumonia    history of pneumonia  . Psoriasis     Tobacco Use: Social History   Tobacco Use  Smoking Status Former Smoker  . Types: Cigars  Smokeless Tobacco Former Systems developer  . Types: Chew  Tobacco Comment   smoked cigars 18yr  ago    Labs: Recent Review Flowsheet Data    Labs for ITP Cardiac and Pulmonary Rehab Latest Ref Rng & Units 11/25/2013 01/11/2018 02/24/2018 02/24/2018 04/27/2018   Cholestrol 100 - 199 mg/dL - 174 - - 113   LDLCALC 0 - 99 mg/dL - 103(H) - - 54   HDL >39 mg/dL - 54 - - 46   Trlycerides 0 - 149 mg/dL - 85 - - 64   Hemoglobin A1c <5.7 % 5.8(H) - - - -   PHART 7.350 - 7.450 - - 7.360 - -   PCO2ART 32.0 - 48.0 mmHg - - 46.0 - -   HCO3 20.0 - 28.0 mmol/L - - 26.0 28.3(H) -   TCO2 22 - 32 mmol/L - - 27 30 -   O2SAT % - - 94.0 67.0 -      Capillary Blood Glucose: Lab Results  Component Value Date   GLUCAP 119 (H) 08/16/2016   GLUCAP 102 (H) 11/27/2013   GLUCAP 145 (H) 11/26/2013   GLUCAP 97 11/25/2013     Exercise Target Goals: Exercise Program Goal: Individual exercise prescription set using results from initial 6 min walk test and THRR while considering  patient's activity barriers and safety.   Exercise Prescription Goal: Initial exercise prescription builds to 30-45 minutes a day  of aerobic activity, 2-3 days per week.  Home exercise guidelines will be given to patient during program as part of exercise prescription that the participant will acknowledge.  Activity Barriers & Risk Stratification: Activity Barriers & Cardiac Risk Stratification - 05/10/18 1001    Activity Barriers & Cardiac Risk Stratification          Activity Barriers  Muscular Weakness;Deconditioning    Cardiac Risk Stratification  High           6 Minute Walk: 6 Minute Walk    6 Minute Walk    Row Name 05/10/18 0958   Phase  Initial   Distance  1575 feet   Walk Time  6 minutes   # of Rest Breaks  0   MPH  2.98   METS  2.83   RPE  11   VO2 Peak  9.9   Symptoms  No   Resting HR  78 bpm   Resting BP  132/74   Resting Oxygen Saturation   99 %   Exercise Oxygen Saturation  during 6 min walk  97 %   Max Ex. HR  119 bpm   Max Ex. BP  130/80   2 Minute Post BP  128/78          Oxygen  Initial Assessment:   Oxygen Re-Evaluation:   Oxygen Discharge (Final Oxygen Re-Evaluation):   Initial Exercise Prescription: Initial Exercise Prescription - 05/10/18 1000    Date of Initial Exercise RX and Referring Provider          Date  05/10/18    Referring Provider  Dr. Wynonia Lawman     Expected Discharge Date  08/19/18        Bike          Level  3    Minutes  10    METs  2.7        NuStep          Level  3    SPM  85    Minutes  10    METs  2.8        Track          Laps  11    Minutes  10    METs  2.91        Prescription Details          Frequency (times per week)  3    Duration  Progress to 30 minutes of continuous aerobic without signs/symptoms of physical distress        Intensity          THRR 40-80% of Max Heartrate  56-111    Ratings of Perceived Exertion  11-13        Progression          Progression  Continue to progress workloads to maintain intensity without signs/symptoms of physical distress.        Resistance Training          Training Prescription  Yes    Weight  4 lbs.    Reps  10-15           Perform Capillary Blood Glucose checks as needed.  Exercise Prescription Changes: Exercise Prescription Changes    Response to Exercise    Row Name 05/17/18 1528 05/30/18 1600 06/03/18 1000 06/28/18 1500   Blood Pressure (Admit)  100/60  124/82  122/70  140/70   Blood Pressure (Exercise)  158/80  130/70  140/80  164/78  Blood Pressure (Exit)  108/70  122/70  120/68  130/62   Heart Rate (Admit)  71 bpm  70 bpm  84 bpm  83 bpm   Heart Rate (Exercise)  111 bpm  113 bpm  115 bpm  111 bpm   Heart Rate (Exit)  71 bpm  78 bpm  78 bpm  75 bpm   Rating of Perceived Exertion (Exercise)  '11  12  11  11   '$ Comments  Pt first day of exercise  no documentation  no documentation  no documentation   Duration  Continue with 30 min of aerobic exercise without signs/symptoms of physical distress.  Continue with 30 min of aerobic exercise without  signs/symptoms of physical distress.  Continue with 30 min of aerobic exercise without signs/symptoms of physical distress.  Continue with 30 min of aerobic exercise without signs/symptoms of physical distress.   Intensity  THRR unchanged  THRR unchanged  THRR unchanged  THRR unchanged       Progression    Row Name 05/17/18 1528 05/30/18 1600 06/03/18 1000 06/28/18 1500   Progression  Continue to progress workloads to maintain intensity without signs/symptoms of physical distress.  Continue to progress workloads to maintain intensity without signs/symptoms of physical distress.  Continue to progress workloads to maintain intensity without signs/symptoms of physical distress.  Continue to progress workloads to maintain intensity without signs/symptoms of physical distress.   Average METs  2.67  2.95  3.2  3.3       Resistance Training    Row Name 05/17/18 1528 05/30/18 1600 06/03/18 1000 06/28/18 1500   Training Prescription  Yes  Yes  No  Yes   Weight  4 lbs.  4 lbs.  no documentation  5 lbs.    Reps  10-15  10-15  no documentation  10-15   Time  10 Minutes  10 Minutes  no documentation  [place] Name 05/17/18 1528 05/30/18 1600 06/03/18 1000 06/28/18 1500   Level  '3  4  4  5   '$ Minutes  '10  10  10  10   '$ METs  2.7  2.7  3.57  3.57       NuStep    Row Name 05/17/18 1528 05/30/18 1600 06/03/18 1000 06/28/18 1500   Level  '3  4  4  5   '$ SPM  85  85  85  85   Minutes  '10  10  10  10   '$ METs  2.4  3.4  2.7  3.1       Track    Row Name 05/17/18 1528 05/30/18 1600 06/03/18 1000 06/28/18 1500   Laps  '11  14  13  14   '$ Minutes  '10  10  10  10   '$ METs  2.91  3.45  2.91  3.45       [place] Name 05/17/18 1528 05/30/18 1600 06/03/18 1000 06/28/18 1500   Plans to continue exercise at  no documentation  Home (comment) Pt is exercising at the [facility].   Home (comment) Pt is exercising at the [facility].   Home (comment) Pt is exercising at the [facility].    Frequency  no  documentation  Add 3 additional days to program exercise sessions.  Add 3 additional days to program exercise sessions.  Add 3 additional days to program exercise sessions.   Initial Home Exercises  Provided  no documentation  05/30/18  05/30/18  05/30/18          Exercise Comments: Exercise Comments    Row Name 05/18/18 1451 05/30/18 1613 06/08/18 1018 06/28/18 1542   Exercise Comments  Reviewed METs and goals with PT. Plans to continue exercising at the [facility] in addition to cardiac rehab.   Reviewed HEP with Pt. Pt was responsive and plans to continue exercising at the [facility] 2-4 days per week for 30-45 minutes.   Reviewed METs and goals with Pt. Pt will continue going to the [facility] for exercise.   Reviewed METs and goals with Pt. Pt will continue going to the [facility] for exercise.       Exercise Goals and Review: Exercise Goals    Exercise Goals    Row Name 05/10/18 1001   Increase Physical Activity  Yes   Intervention  Provide advice, education, support and counseling about physical activity/exercise needs.;Develop an individualized exercise prescription for aerobic and resistive training based on initial evaluation findings, risk stratification, comorbidities and participant's personal goals.   Expected Outcomes  Short Term: Attend rehab on a regular basis to increase amount of physical activity.   Increase Strength and Stamina  Yes   Intervention  Provide advice, education, support and counseling about physical activity/exercise needs.;Develop an individualized exercise prescription for aerobic and resistive training based on initial evaluation findings, risk stratification, comorbidities and participant's personal goals.   Expected Outcomes  Short Term: Increase workloads from initial exercise prescription for resistance, speed, and METs.   Able to understand and use rate of perceived exertion (RPE) scale  Yes   Intervention  Provide education and explanation on how to use RPE scale    Expected Outcomes  Short Term: Able to use RPE daily in rehab to express subjective intensity level;Long Term:  Able to use RPE to guide intensity level when exercising independently   Knowledge and understanding of Target Heart Rate Range (THRR)  Yes   Intervention  Provide education and explanation of THRR including how the numbers were predicted and where they are located for reference   Expected Outcomes  Short Term: Able to state/look up THRR;Long Term: Able to use THRR to govern intensity when exercising independently;Short Term: Able to use daily as guideline for intensity in rehab   Able to check pulse independently  Yes   Intervention  Provide education and demonstration on how to check pulse in carotid and radial arteries.;Review the importance of being able to check your own pulse for safety during independent exercise   Expected Outcomes  Short Term: Able to explain why pulse checking is important during independent exercise;Long Term: Able to check pulse independently and accurately   Understanding of Exercise Prescription  Yes   Intervention  Provide education, explanation, and written materials on patient's individual exercise prescription   Expected Outcomes  Short Term: Able to explain program exercise prescription;Long Term: Able to explain home exercise prescription to exercise independently          Exercise Goals Re-Evaluation : Exercise Goals Re-Evaluation    Exercise Goal Re-Evaluation    Row Name 05/18/18 1448 05/30/18 1611 06/08/18 1017 06/28/18 1541   Exercise Goals Review  Increase Physical Activity;Increase Strength and Stamina;Able to understand and use rate of perceived exertion (RPE) scale;Knowledge and understanding of Target Heart Rate Range (THRR);Understanding of Exercise Prescription;Able to check pulse independently  Increase Physical Activity;Increase Strength and Stamina;Able to understand and use rate of perceived exertion (RPE) scale;Knowledge and  understanding of Target Heart Rate Range (THRR);Understanding of Exercise Prescription;Able to check pulse independently  Increase Physical Activity;Increase Strength and Stamina;Able to understand and use rate of perceived exertion (RPE) scale;Knowledge and understanding of Target Heart Rate Range (THRR);Understanding of Exercise Prescription;Able to check pulse independently  Increase Physical Activity;Increase Strength and Stamina;Able to understand and use rate of perceived exertion (RPE) scale;Knowledge and understanding of Target Heart Rate Range (THRR);Understanding of Exercise Prescription;Able to check pulse independently   Comments  Reviewed METs and goals with Pt. Pt is new to the program and has a MET level of 2.67. Pt is currently going to the [facility] 2 days a week for 2 hours.   Reviewed HEP with Pt. Pt was responsive and understands THRR, exercise precautions, NTG use, end points of exercise, and weather precautions. Pt plans on exercising at the [facility] in addition to cardiac reahb.   Reviewed METs and goals with Pt. MET level is 3.2 and continues to increase with each session. Pt exercises at the [facility] in addition to Cardiac Rehab.   Reviewed METs and goals with Pt. MET level is 3.3 and continues to increase with each session. Pt exercises at the [facility] in addition to Cardiac Rehab.    Expected Outcomes  Will continue to monitor and progress Pt as tolerated.   Will continue to monitor and progress Pt as tolerated.   Will continue to monitor and progress Pt as tolerated.   Will continue to monitor and progress Pt as tolerated.           Discharge Exercise Prescription (Final Exercise Prescription Changes): Exercise Prescription Changes - 06/28/18 1500    Response to Exercise          Blood Pressure (Admit)  140/70    Blood Pressure (Exercise)  164/78    Blood Pressure (Exit)  130/62    Heart Rate (Admit)  83 bpm    Heart Rate (Exercise)  111 bpm    Heart Rate (Exit)  75 bpm    Rating of  Perceived Exertion (Exercise)  11    Duration  Continue with 30 min of aerobic exercise without signs/symptoms of physical distress.    Intensity  THRR unchanged        Progression          Progression  Continue to progress workloads to maintain intensity without signs/symptoms of physical distress.    Average METs  3.3        Resistance Training          Training Prescription  Yes    Weight  5 lbs.     Reps  10-15    Time  10 Minutes        Bike          Level  5    Minutes  10    METs  3.57        NuStep          Level  5    SPM  85    Minutes  10    METs  3.1        Track          Laps  14    Minutes  10    METs  3.45        Home Exercise Plan          Plans to continue exercise at  Home (comment)   Pt is exercising at the [facility].    Frequency  Add 3 additional days to program exercise sessions.    Initial Home Exercises Provided  05/30/18           Nutrition:  Target Goals: Understanding of nutrition guidelines, daily intake of sodium '1500mg'$ , cholesterol '200mg'$ , calories 30% from fat and 7% or less from saturated fats, daily to have 5 or more servings of fruits and vegetables.  Biometrics: Pre Biometrics - 05/10/18 0959    Pre Biometrics          Height  5' 11.5" (1.816 m)    Weight  100.5 kg    Waist Circumference  42 inches    Hip Circumference  49 inches    Waist to Hip Ratio  0.86 %    BMI (Calculated)  30.47    Triceps Skinfold  25 mm    % Body Fat  31.7 %    Grip Strength  33 kg    Flexibility  13 in    Single Leg Stand  7.4 seconds            Nutrition Therapy Plan and Nutrition Goals: Nutrition Therapy & Goals - 05/10/18 1033    Nutrition Therapy          Diet  heart healthy, carb modified        Personal Nutrition Goals          Nutrition Goal  Pt to identify and limit food sources of saturated fat, trans fat, refined carbohydrates and sodium    Personal Goal #2  Pt to eat a small lunch meal instead of skipping lunch     Personal Goal #3  Pt able to name foods that affect blood glucose.        Intervention Plan          Intervention  Prescribe, educate and counsel regarding individualized specific dietary modifications aiming towards targeted core components such as weight, hypertension, lipid management, diabetes, heart failure and other comorbidities.    Expected Outcomes  Short Term Goal: Understand basic principles of dietary content, such as calories, fat, sodium, cholesterol and nutrients.;Long Term Goal: Adherence to prescribed nutrition plan.           Nutrition Assessments: Nutrition Assessments - 05/10/18 1034    MEDFICTS Scores          Pre Score  42           Nutrition Goals Re-Evaluation:   Nutrition Goals Re-Evaluation:   Nutrition Goals Discharge (Final Nutrition Goals Re-Evaluation):   Psychosocial: Target Goals: Acknowledge presence or absence of significant depression and/or stress, maximize coping skills, provide positive support system. Participant is able to verbalize types and ability to use techniques and skills needed for reducing stress and depression.  Initial Review & Psychosocial Screening: Initial Psych Review & Screening - 05/10/18 1213    Initial Review          Current issues with  None Identified        Family Dynamics          Good Support System?  Yes   FAMILY        Barriers          Psychosocial barriers to participate in program  There are no identifiable barriers or psychosocial needs.        Screening Interventions          Interventions  Encouraged to exercise           Quality of Life Scores: Quality of Life -  05/10/18 1004    Quality of Life          Select  Quality of Life          Scores of 19 and below usually indicate a poorer quality of life in these areas.  A difference of  2-3 points is a clinically meaningful difference.  A difference of 2-3 points in the total score of the Quality of Life Index has been  associated with significant improvement in overall quality of life, self-image, physical symptoms, and general health in studies assessing change in quality of life.  PHQ-9: Recent Review Flowsheet Data    Depression screen [facility] 2/9 05/16/2018   Decreased Interest 0   Down, Depressed, Hopeless 0   PHQ - 2 Score 0     Interpretation of Total Score  Total Score Depression Severity:  1-4 = Minimal depression, 5-9 = Mild depression, 10-14 = Moderate depression, 15-19 = Moderately severe depression, 20-27 = Severe depression   Psychosocial Evaluation and Intervention: Psychosocial Evaluation - 05/16/18 1434    Psychosocial Evaluation & Interventions          Interventions  Encouraged to exercise with the program and follow exercise prescription    Comments  no psychosocial needs identified, no interventions necessary     Expected Outcomes  pt will exhibit positive outlook with good coping skills.    Continue Psychosocial Services   No Follow up required           Psychosocial Re-Evaluation: Psychosocial Re-Evaluation    Psychosocial Re-Evaluation    [place] Name 05/19/18 1216 06/03/18 1537 07/01/18 0743   Current issues with  None Identified  None Identified  None Identified   Comments  no psychosocial needs identified, no interventions necessary   no psychosocial needs identified, no interventions necessary   no psychosocial needs identified, no interventions necessary    Expected Outcomes  pt will exhibit positive outlook with good coping skills.   pt will exhibit positive outlook with good coping skills.   pt will exhibit positive outlook with good coping skills.    Interventions  Encouraged to attend Cardiac Rehabilitation for the exercise  Encouraged to attend Cardiac Rehabilitation for the exercise  Encouraged to attend Cardiac Rehabilitation for the exercise   Continue Psychosocial Services   No Follow up required  No Follow up required  No Follow up required           Psychosocial Discharge (Final Psychosocial Re-Evaluation): Psychosocial Re-Evaluation - 07/01/18 0743    Psychosocial Re-Evaluation          Current issues with  None Identified    Comments  no psychosocial needs identified, no interventions necessary     Expected Outcomes  pt will exhibit positive outlook with good coping skills.     Interventions  Encouraged to attend Cardiac Rehabilitation for the exercise    Continue Psychosocial Services   No Follow up required           Vocational Rehabilitation: Provide vocational rehab assistance to qualifying candidates.   Vocational Rehab Evaluation & Intervention: Vocational Rehab - 05/10/18 1214    Initial Vocational Rehab Evaluation & Intervention          Assessment shows need for Vocational Rehabilitation  No           Education: Education Goals: Education classes will be provided on a weekly basis, covering required topics. Participant will state understanding/return demonstration of topics presented.  Learning Barriers/Preferences: Learning Barriers/Preferences - 05/10/18 1004  Learning Barriers/Preferences          Learning Barriers  Sight   Memory deficits.    Learning Preferences  Video;Pictoral           Education Topics: Count Your Pulse:  -Group instruction provided by verbal instruction, demonstration, patient participation and written materials to support subject.  Instructors address importance of being able to find your pulse and how to count your pulse when at home without a heart monitor.  Patients get hands on experience counting their pulse with staff help and individually.   Heart Attack, Angina, and Risk Factor Modification:  -Group instruction provided by verbal instruction, video, and written materials to support subject.  Instructors address signs and symptoms of angina and heart attacks.    Also discuss risk factors for heart disease and how to make changes to improve heart health risk  factors. Flowsheet Row CARDIAC REHAB PHASE II EXERCISE from 06/15/2018 in [place]  Date  06/08/18  Instruction Review Code  2- Demonstrated Understanding      Functional Fitness:  -Group instruction provided by verbal instruction, demonstration, patient participation, and written materials to support subject.  Instructors address safety measures for doing things around the house.  Discuss how to get up and down off the floor, how to pick things up properly, how to safely get out of a chair without assistance, and balance training.   Meditation and Mindfulness:  -Group instruction provided by verbal instruction, patient participation, and written materials to support subject.  Instructor addresses importance of mindfulness and meditation practice to help reduce stress and improve awareness.  Instructor also leads participants through a meditation exercise.    Stretching for Flexibility and Mobility:  -Group instruction provided by verbal instruction, patient participation, and written materials to support subject.  Instructors lead participants through series of stretches that are designed to increase flexibility thus improving mobility.  These stretches are additional exercise for major muscle groups that are typically performed during regular warm up and cool down.   Hands Only CPR:  -Group verbal, video, and participation provides a basic overview of AHA guidelines for community CPR. Role-play of emergencies allow participants the opportunity to practice calling for help and chest compression technique with discussion of AED use.   Hypertension: -Group verbal and written instruction that provides a basic overview of hypertension including the most recent diagnostic guidelines, risk factor reduction with self-care instructions and medication management.    Nutrition I class: Heart Healthy Eating:  -Group instruction provided by PowerPoint slides,  verbal discussion, and written materials to support subject matter. The instructor gives an explanation and review of the Therapeutic Lifestyle Changes diet recommendations, which includes a discussion on lipid goals, dietary fat, sodium, fiber, plant stanol/sterol esters, sugar, and the components of a well-balanced, healthy diet.   Nutrition II class: Lifestyle Skills:  -Group instruction provided by PowerPoint slides, verbal discussion, and written materials to support subject matter. The instructor gives an explanation and review of label reading, grocery shopping for heart health, heart healthy recipe modifications, and ways to make healthier choices when eating out.   Diabetes Question & Answer:  -Group instruction provided by PowerPoint slides, verbal discussion, and written materials to support subject matter. The instructor gives an explanation and review of diabetes co-morbidities, pre- and post-prandial blood glucose goals, pre-exercise blood glucose goals, signs, symptoms, and treatment of hypoglycemia and hyperglycemia, and foot care basics.   Diabetes Blitz:  -Group instruction provided by [employer],  verbal discussion, and written materials to support subject matter. The instructor gives an explanation and review of the physiology behind type 1 and type 2 diabetes, diabetes medications and rational behind using different medications, pre- and post-prandial blood glucose recommendations and Hemoglobin A1c goals, diabetes diet, and exercise including blood glucose guidelines for exercising safely.    Portion Distortion:  -Group instruction provided by PowerPoint slides, verbal discussion, written materials, and food models to support subject matter. The instructor gives an explanation of serving size versus portion size, changes in portions sizes over the last 20 years, and what consists of a serving from each food group.   Stress Management:  -Group instruction provided by  verbal instruction, video, and written materials to support subject matter.  Instructors review role of stress in heart disease and how to cope with stress positively.     Exercising on Your Own:  -Group instruction provided by verbal instruction, power point, and written materials to support subject.  Instructors discuss benefits of exercise, components of exercise, frequency and intensity of exercise, and end points for exercise.  Also discuss use of nitroglycerin and activating EMS.  Review options of places to exercise outside of rehab.  Review guidelines for sex with heart disease. Flowsheet Row CARDIAC REHAB PHASE II EXERCISE from 06/15/2018 in [place]  Date  06/15/18  Educator  EP  Instruction Review Code  2- Demonstrated Understanding      Cardiac Drugs I:  -Group instruction provided by verbal instruction and written materials to support subject.  Instructor reviews cardiac drug classes: antiplatelets, anticoagulants, beta blockers, and statins.  Instructor discusses reasons, side effects, and lifestyle considerations for each drug class.   Cardiac Drugs II:  -Group instruction provided by verbal instruction and written materials to support subject.  Instructor reviews cardiac drug classes: angiotensin converting enzyme inhibitors (ACE-I), angiotensin II receptor blockers (ARBs), nitrates, and calcium channel blockers.  Instructor discusses reasons, side effects, and lifestyle considerations for each drug class. Flowsheet Row CARDIAC REHAB PHASE II EXERCISE from 06/15/2018 in [place]  Date  06/01/18  Educator  Pharmacist  Instruction Review Code  2- Demonstrated Understanding      Anatomy and Physiology of the Circulatory System:  Group verbal and written instruction and models provide basic cardiac anatomy and physiology, with the coronary electrical and arterial systems. Review of: AMI, Angina, Valve disease,  Heart Failure, Peripheral Artery Disease, Cardiac Arrhythmia, Pacemakers, and the ICD.   Other Education:  -Group or individual verbal, written, or video instructions that support the educational goals of the cardiac rehab program.   Holiday Eating Survival Tips:  -Group instruction provided by PowerPoint slides, verbal discussion, and written materials to support subject matter. The instructor gives patients tips, tricks, and techniques to help them not only survive but enjoy the holidays despite the onslaught of food that accompanies the holidays.   Knowledge Questionnaire Score: Knowledge Questionnaire Score - 05/10/18 0931    Knowledge Questionnaire Score          Pre Score  18/24           Core Components/Risk Factors/Patient Goals at Admission: Personal Goals and Risk Factors at Admission - 05/10/18 1005    Core Components/Risk Factors/Patient Goals on Admission           Weight Management  Yes;Weight Maintenance;Weight Loss    Intervention  Weight Management: Develop a combined nutrition and exercise program designed to reach desired caloric intake, while  maintaining appropriate intake of nutrient and fiber, sodium and fats, and appropriate energy expenditure required for the weight goal.;Weight Management: Provide education and appropriate resources to help participant work on and attain dietary goals.;Weight Management/Obesity: Establish reasonable short term and long term weight goals.    Admit Weight  221 lb 9 oz (100.5 kg)    Expected Outcomes  Short Term: Continue to assess and modify interventions until short term weight is achieved;Long Term: Adherence to nutrition and physical activity/exercise program aimed toward attainment of established weight goal;Weight Maintenance: Understanding of the daily nutrition guidelines, which includes 25-35% calories from fat, 7% or less cal from saturated fats, less than '200mg'$  cholesterol, less than 1.5gm of sodium, & 5 or more servings  of fruits and vegetables daily;Weight Loss: Understanding of general recommendations for a balanced deficit meal plan, which promotes 1-2 lb weight loss per week and includes a negative energy balance of [id number] kcal/d;Understanding recommendations for meals to include 15-35% energy as protein, 25-35% energy from fat, 35-60% energy from carbohydrates, less than '200mg'$  of dietary cholesterol, 20-35 gm of total fiber daily;Understanding of distribution of calorie intake throughout the day with the consumption of 4-5 meals/snacks    Hypertension  Yes    Intervention  Provide education on lifestyle modifcations including regular physical activity/exercise, weight management, moderate sodium restriction and increased consumption of fresh fruit, vegetables, and low fat dairy, alcohol moderation, and smoking cessation.;Monitor prescription use compliance.    Expected Outcomes  Short Term: Continued assessment and intervention until BP is < 140/50m HG in hypertensive participants. < 130/896mHG in hypertensive participants with diabetes, heart failure or chronic kidney disease.;Long Term: Maintenance of blood pressure at goal levels.    Lipids  Yes    Intervention  Provide education and support for participant on nutrition & aerobic/resistive exercise along with prescribed medications to achieve LDL '70mg'$ , HDL >'40mg'$ .    Expected Outcomes  Short Term: Participant states understanding of desired cholesterol values and is compliant with medications prescribed. Participant is following exercise prescription and nutrition guidelines.;Long Term: Cholesterol controlled with medications as prescribed, with individualized exercise RX and with personalized nutrition plan. Value goals: LDL < '70mg'$ , HDL > 40 mg.    Stress  Yes    Intervention  Offer individual and/or small group education and counseling on adjustment to heart disease, stress management and health-related lifestyle change. Teach and support self-help  strategies.;Refer participants experiencing significant psychosocial distress to appropriate mental health specialists for further evaluation and treatment. When possible, include family members and significant others in education/counseling sessions.    Expected Outcomes  Short Term: Participant demonstrates changes in health-related behavior, relaxation and other stress management skills, ability to obtain effective social support, and compliance with psychotropic medications if prescribed.;Long Term: Emotional wellbeing is indicated by absence of clinically significant psychosocial distress or social isolation.           Core Components/Risk Factors/Patient Goals Review:  Goals and Risk Factor Review    Core Components/Risk Factors/Patient Goals Review    Row Name 05/16/18 1434 06/03/18 1537 07/01/18 0743   Personal Goals Review  Weight Management/Obesity;Hypertension;Lipids;Stress  Weight Management/Obesity;Hypertension;Lipids;Stress  Weight Management/Obesity;Hypertension;Lipids;Stress   Review  pt with multiple CAD RF demonstrates willingness to participate in CR activities. pt personal goals are to increase strength/stamina.   pt with multiple CAD RF demonstrates willingness to participate in CR activities. pt personal goals are to increase strength/stamina. pt c/o fatigue and DOE climbing stairs at home. pt will add stairs to walking track at  CR.  pt goes to [employer] and [facility] twice weekly.    pt with multiple CAD RF demonstrates willingness to participate in CR activities. pt personal goals are to increase strength/stamina. pt c/o fatigue and DOE climbing stairs at home. pt will add stairs to walking track at CR.  pt goes to [facility] using [employer] and [employer] twice weekly.  pt reports comfort in gaining knowledge about lifestyle modifications and heart disease.    Expected Outcomes  pt will participate in CR exercise, nutrition and lifestyle modification opportunities.   pt will participate in CR  exercise, nutrition and lifestyle modification opportunities.   pt will participate in CR exercise, nutrition and lifestyle modification opportunities.           Core Components/Risk Factors/Patient Goals at Discharge (Final Review):  Goals and Risk Factor Review - 07/01/18 0743    Core Components/Risk Factors/Patient Goals Review          Personal Goals Review  Weight Management/Obesity;Hypertension;Lipids;Stress    Review  pt with multiple CAD RF demonstrates willingness to participate in CR activities. pt personal goals are to increase strength/stamina. pt c/o fatigue and DOE climbing stairs at home. pt will add stairs to walking track at CR.  pt goes to [facility] using [employer] and [place] twice weekly.  pt reports comfort in gaining knowledge about lifestyle modifications and heart disease.     Expected Outcomes  pt will participate in CR exercise, nutrition and lifestyle modification opportunities.            ITP Comments: ITP Comments    Row Name 05/10/18 6004 05/16/18 1432 06/03/18 1536 07/06/18 1635   ITP Comments  Dr. Fransico Him, Medical Director   pt started group exercise. pt tolerated light activity without difficulty. pt oriented to exercise equipment and safety routine.   30 day ITP review. pt with good attendance and participation. pt demonstrates eagerness to participate in CR program.   30 day ITP review. pt with good attendance and participation. pt demonstrates eagerness to participate in CR program.       Comments:

## 2018-07-08 ENCOUNTER — Ambulatory Visit (HOSPITAL_COMMUNITY): Payer: Medicare HMO

## 2018-07-08 ENCOUNTER — Encounter (HOSPITAL_COMMUNITY)
Admission: RE | Admit: 2018-07-08 | Discharge: 2018-07-08 | Disposition: A | Discharge status: Home / Self Care | Payer: Medicare HMO | Source: Ambulatory Visit | Attending: Cardiology | Admitting: Cardiology

## 2018-07-08 DIAGNOSIS — Z955 Presence of coronary angioplasty implant and graft: Secondary | ICD-10-CM | POA: Diagnosis not present

## 2018-07-11 ENCOUNTER — Ambulatory Visit (HOSPITAL_COMMUNITY): Payer: Medicare HMO

## 2018-07-11 ENCOUNTER — Encounter (HOSPITAL_COMMUNITY)
Admission: RE | Admit: 2018-07-11 | Discharge: 2018-07-11 | Disposition: A | Discharge status: Home / Self Care | Payer: Medicare HMO | Source: Ambulatory Visit | Attending: Cardiology | Admitting: Cardiology

## 2018-07-11 DIAGNOSIS — Z955 Presence of coronary angioplasty implant and graft: Secondary | ICD-10-CM

## 2018-07-13 ENCOUNTER — Encounter (HOSPITAL_COMMUNITY)
Admission: RE | Admit: 2018-07-13 | Discharge: 2018-07-13 | Disposition: A | Discharge status: Home / Self Care | Payer: Medicare HMO | Source: Ambulatory Visit | Attending: Cardiology | Admitting: Cardiology

## 2018-07-13 ENCOUNTER — Ambulatory Visit (HOSPITAL_COMMUNITY): Payer: Medicare HMO

## 2018-07-13 DIAGNOSIS — Z955 Presence of coronary angioplasty implant and graft: Secondary | ICD-10-CM | POA: Diagnosis not present

## 2018-07-14 ENCOUNTER — Ambulatory Visit (INDEPENDENT_AMBULATORY_CARE_PROVIDER_SITE_OTHER): Payer: Medicare HMO

## 2018-07-14 DIAGNOSIS — I4892 Unspecified atrial flutter: Secondary | ICD-10-CM | POA: Diagnosis not present

## 2018-07-15 ENCOUNTER — Ambulatory Visit (HOSPITAL_COMMUNITY): Payer: Medicare HMO

## 2018-07-15 ENCOUNTER — Encounter (HOSPITAL_COMMUNITY)
Admission: RE | Admit: 2018-07-15 | Discharge: 2018-07-15 | Disposition: A | Discharge status: Home / Self Care | Payer: Medicare HMO | Source: Ambulatory Visit | Attending: Cardiology | Admitting: Cardiology

## 2018-07-15 DIAGNOSIS — Z955 Presence of coronary angioplasty implant and graft: Secondary | ICD-10-CM | POA: Diagnosis not present

## 2018-07-15 DIAGNOSIS — Z0000 Encounter for general adult medical examination without abnormal findings: Secondary | ICD-10-CM | POA: Diagnosis not present

## 2018-07-15 DIAGNOSIS — Z8739 Personal history of other diseases of the musculoskeletal system and connective tissue: Secondary | ICD-10-CM | POA: Diagnosis not present

## 2018-07-15 DIAGNOSIS — I2510 Atherosclerotic heart disease of native coronary artery without angina pectoris: Secondary | ICD-10-CM | POA: Diagnosis not present

## 2018-07-15 DIAGNOSIS — I10 Essential (primary) hypertension: Secondary | ICD-10-CM | POA: Diagnosis not present

## 2018-07-15 DIAGNOSIS — Z8679 Personal history of other diseases of the circulatory system: Secondary | ICD-10-CM | POA: Diagnosis not present

## 2018-07-15 DIAGNOSIS — D649 Anemia, unspecified: Secondary | ICD-10-CM | POA: Diagnosis not present

## 2018-07-15 DIAGNOSIS — N401 Enlarged prostate with lower urinary tract symptoms: Secondary | ICD-10-CM | POA: Diagnosis not present

## 2018-07-15 DIAGNOSIS — Z1389 Encounter for screening for other disorder: Secondary | ICD-10-CM | POA: Diagnosis not present

## 2018-07-15 DIAGNOSIS — I350 Nonrheumatic aortic (valve) stenosis: Secondary | ICD-10-CM | POA: Diagnosis not present

## 2018-07-18 ENCOUNTER — Encounter (HOSPITAL_COMMUNITY)
Admission: RE | Admit: 2018-07-18 | Discharge: 2018-07-18 | Disposition: A | Discharge status: Home / Self Care | Payer: Medicare HMO | Source: Ambulatory Visit | Attending: Cardiology | Admitting: Cardiology

## 2018-07-18 ENCOUNTER — Ambulatory Visit (HOSPITAL_COMMUNITY): Payer: Medicare HMO

## 2018-07-18 DIAGNOSIS — Z955 Presence of coronary angioplasty implant and graft: Secondary | ICD-10-CM | POA: Diagnosis not present

## 2018-07-20 ENCOUNTER — Encounter (HOSPITAL_COMMUNITY)
Admission: RE | Admit: 2018-07-20 | Discharge: 2018-07-20 | Disposition: A | Discharge status: Home / Self Care | Payer: Medicare HMO | Source: Ambulatory Visit | Attending: Cardiology | Admitting: Cardiology

## 2018-07-20 ENCOUNTER — Ambulatory Visit (HOSPITAL_COMMUNITY): Payer: Medicare HMO

## 2018-07-20 DIAGNOSIS — Z955 Presence of coronary angioplasty implant and graft: Secondary | ICD-10-CM

## 2018-07-22 ENCOUNTER — Telehealth: Payer: Self-pay | Admitting: *Deleted

## 2018-07-22 ENCOUNTER — Encounter (HOSPITAL_COMMUNITY)
Admission: RE | Admit: 2018-07-22 | Discharge: 2018-07-22 | Disposition: A | Discharge status: Home / Self Care | Payer: Medicare HMO | Source: Ambulatory Visit | Attending: Cardiology | Admitting: Cardiology

## 2018-07-22 ENCOUNTER — Ambulatory Visit (HOSPITAL_COMMUNITY): Payer: Medicare HMO

## 2018-07-22 DIAGNOSIS — Z955 Presence of coronary angioplasty implant and graft: Secondary | ICD-10-CM | POA: Diagnosis not present

## 2018-07-22 NOTE — Telephone Encounter (Signed)
Spoke with Steven [name] at Cardiac rehab as she wanted to make Dr. Bettina Gavia aware that patient had another epiosde of SVT this morning. EKG strips have been reviewed by Dr. Bettina Gavia and patient is currenly wearing a ZIO monitor. No new recommendations at this time. Steven [name] made aware. No further questions.

## 2018-07-25 ENCOUNTER — Encounter (HOSPITAL_COMMUNITY)
Admission: RE | Admit: 2018-07-25 | Discharge: 2018-07-25 | Disposition: A | Discharge status: Home / Self Care | Payer: Medicare HMO | Source: Ambulatory Visit | Attending: Cardiology | Admitting: Cardiology

## 2018-07-25 ENCOUNTER — Ambulatory Visit (HOSPITAL_COMMUNITY): Payer: Medicare HMO

## 2018-07-25 DIAGNOSIS — Z955 Presence of coronary angioplasty implant and graft: Secondary | ICD-10-CM | POA: Diagnosis not present

## 2018-07-27 ENCOUNTER — Ambulatory Visit (HOSPITAL_COMMUNITY): Payer: Medicare HMO

## 2018-07-27 ENCOUNTER — Encounter (HOSPITAL_COMMUNITY)
Admission: RE | Admit: 2018-07-27 | Discharge: 2018-07-27 | Disposition: A | Discharge status: Home / Self Care | Payer: Medicare HMO | Source: Ambulatory Visit | Attending: Cardiology | Admitting: Cardiology

## 2018-07-27 DIAGNOSIS — Z955 Presence of coronary angioplasty implant and graft: Secondary | ICD-10-CM | POA: Diagnosis not present

## 2018-07-29 ENCOUNTER — Ambulatory Visit (HOSPITAL_COMMUNITY): Payer: Medicare HMO

## 2018-07-29 ENCOUNTER — Encounter (HOSPITAL_COMMUNITY)
Admission: RE | Admit: 2018-07-29 | Discharge: 2018-07-29 | Disposition: A | Discharge status: Home / Self Care | Payer: Medicare HMO | Source: Ambulatory Visit | Attending: Cardiology | Admitting: Cardiology

## 2018-07-29 DIAGNOSIS — Z955 Presence of coronary angioplasty implant and graft: Secondary | ICD-10-CM

## 2018-08-01 ENCOUNTER — Encounter (HOSPITAL_COMMUNITY)
Admission: RE | Admit: 2018-08-01 | Discharge: 2018-08-01 | Disposition: A | Discharge status: Home / Self Care | Payer: Medicare HMO | Source: Ambulatory Visit | Attending: Cardiology | Admitting: Cardiology

## 2018-08-01 ENCOUNTER — Ambulatory Visit (HOSPITAL_COMMUNITY): Payer: Medicare HMO

## 2018-08-01 VITALS — Ht 71.5 in | Wt 220.2 lb

## 2018-08-01 DIAGNOSIS — Z955 Presence of coronary angioplasty implant and graft: Secondary | ICD-10-CM | POA: Insufficient documentation

## 2018-08-02 DIAGNOSIS — I4892 Unspecified atrial flutter: Secondary | ICD-10-CM | POA: Diagnosis not present

## 2018-08-03 ENCOUNTER — Ambulatory Visit (HOSPITAL_COMMUNITY): Payer: Medicare HMO

## 2018-08-03 ENCOUNTER — Encounter (HOSPITAL_COMMUNITY)
Admission: RE | Admit: 2018-08-03 | Discharge: 2018-08-03 | Disposition: A | Discharge status: Home / Self Care | Payer: Medicare HMO | Source: Ambulatory Visit | Attending: Cardiology | Admitting: Cardiology

## 2018-08-03 DIAGNOSIS — Z955 Presence of coronary angioplasty implant and graft: Secondary | ICD-10-CM

## 2018-08-04 NOTE — Progress Notes (Signed)
Cardiac Individual Treatment Plan  Patient Details  Name: Steven [name] MRN: [id number] Date of Birth: [birth year] Referring Provider:   Flowsheet Row CARDIAC REHAB PHASE II ORIENTATION from 05/10/2018 in [place]  Referring Provider  Dr. Wynonia Lawman       Initial Encounter Date:  Flowsheet Row CARDIAC REHAB PHASE II ORIENTATION from 05/10/2018 in [place]  Date  05/10/18      Visit Diagnosis: 02/24/2018 Stented coronary artery  Patient's Home Medications on Admission:  Current Outpatient Medications:  .  aspirin EC 81 MG tablet, Take 1 tablet (81 mg total) by mouth daily., Disp: 30 tablet, Rfl: 0 .  atorvastatin (LIPITOR) 40 MG tablet, Take 40 mg by mouth daily., Disp: , Rfl: 12 .  clopidogrel (PLAVIX) 75 MG tablet, Take 1 tablet (75 mg total) by mouth daily., Disp: 90 tablet, Rfl: 1 .  Cyanocobalamin (VITAMIN B-12 PO), Take 1 tablet by mouth daily., Disp: , Rfl:  .  metoprolol succinate (TOPROL-XL) 25 MG 24 hr tablet, Take 25 mg by mouth daily., Disp: , Rfl: 12 .  nitroGLYCERIN (NITROSTAT) 0.4 MG SL tablet, Place 1 tablet (0.4 mg total) under the tongue every 5 (five) minutes as needed., Disp: 25 tablet, Rfl: 2  Past Medical History: Past Medical History:  Diagnosis Date  . Atrial flutter ([place])   . CAD (coronary artery disease)    02/24/18 PCI/DES x1 to the pLAD  . Carpal tunnel syndrome   . Cataract    left eye  . Chest pain   . CKD (chronic kidney disease), stage III ([place])   . Enlarged prostate   . History of gout   . History of kidney stones    pt has one now but not giving him any problems  . Numbness    both hands pt states pinched. 12-17-14 Gabapentin has improved.  . Pneumonia    history of pneumonia  . Psoriasis     Tobacco Use: Social History   Tobacco Use  Smoking Status Former Smoker  . Types: Cigars  Smokeless Tobacco Former Systems developer  . Types: Chew  Tobacco Comment   smoked cigars 18yr  ago    Labs: Recent Review Flowsheet Data    Labs for ITP Cardiac and Pulmonary Rehab Latest Ref Rng & Units 11/25/2013 01/11/2018 02/24/2018 02/24/2018 04/27/2018   Cholestrol 100 - 199 mg/dL - 174 - - 113   LDLCALC 0 - 99 mg/dL - 103(H) - - 54   HDL >39 mg/dL - 54 - - 46   Trlycerides 0 - 149 mg/dL - 85 - - 64   Hemoglobin A1c <5.7 % 5.8(H) - - - -   PHART 7.350 - 7.450 - - 7.360 - -   PCO2ART 32.0 - 48.0 mmHg - - 46.0 - -   HCO3 20.0 - 28.0 mmol/L - - 26.0 28.3(H) -   TCO2 22 - 32 mmol/L - - 27 30 -   O2SAT % - - 94.0 67.0 -      Capillary Blood Glucose: Lab Results  Component Value Date   GLUCAP 119 (H) 08/16/2016   GLUCAP 102 (H) 11/27/2013   GLUCAP 145 (H) 11/26/2013   GLUCAP 97 11/25/2013     Exercise Target Goals: Exercise Program Goal: Individual exercise prescription set using results from initial 6 min walk test and THRR while considering  patient's activity barriers and safety.   Exercise Prescription Goal: Initial exercise prescription builds to 30-45 minutes a day  of aerobic activity, 2-3 days per week.  Home exercise guidelines will be given to patient during program as part of exercise prescription that the participant will acknowledge.  Activity Barriers & Risk Stratification: Activity Barriers & Cardiac Risk Stratification - 05/10/18 1001    Activity Barriers & Cardiac Risk Stratification          Activity Barriers  Muscular Weakness;Deconditioning    Cardiac Risk Stratification  High           6 Minute Walk: 6 Minute Walk    6 Minute Walk    Row Name 05/10/18 0958 08/01/18 1456   Phase  Initial  Discharge   Distance  1575 feet  1600 feet   Distance % Change  no documentation  1.59 %   Distance Feet Change  no documentation  25 ft   Walk Time  6 minutes  6 minutes   # of Rest Breaks  0  0   MPH  2.98  3.03   METS  2.83  3   RPE  11  9   VO2 Peak  9.9  10.47   Symptoms  No  No   Resting HR  78 bpm  75 bpm   Resting BP  132/74  138/78    Resting Oxygen Saturation   99 %  no documentation   Exercise Oxygen Saturation  during 6 min walk  97 %  no documentation   Max Ex. HR  119 bpm  119 bpm   Max Ex. BP  130/80  142/70   2 Minute Post BP  128/78  136/68          Oxygen Initial Assessment:   Oxygen Re-Evaluation:   Oxygen Discharge (Final Oxygen Re-Evaluation):   Initial Exercise Prescription: Initial Exercise Prescription - 05/10/18 1000    Date of Initial Exercise RX and Referring Provider          Date  05/10/18    Referring Provider  Dr. Wynonia Lawman     Expected Discharge Date  08/19/18        Bike          Level  3    Minutes  10    METs  2.7        NuStep          Level  3    SPM  85    Minutes  10    METs  2.8        Track          Laps  11    Minutes  10    METs  2.91        Prescription Details          Frequency (times per week)  3    Duration  Progress to 30 minutes of continuous aerobic without signs/symptoms of physical distress        Intensity          THRR 40-80% of Max Heartrate  56-111    Ratings of Perceived Exertion  11-13        Progression          Progression  Continue to progress workloads to maintain intensity without signs/symptoms of physical distress.        Resistance Training          Training Prescription  Yes    Weight  4 lbs.    Reps  10-15  Perform Capillary Blood Glucose checks as needed.  Exercise Prescription Changes: Exercise Prescription Changes    Response to Exercise    Row Name 05/17/18 1528 05/30/18 1600 06/03/18 1000 06/28/18 1500 08/01/18 1500   Blood Pressure (Admit)  100/60  124/82  122/70  140/70  138/78   Blood Pressure (Exercise)  158/80  130/70  140/80  164/78  142/70   Blood Pressure (Exit)  108/70  122/70  120/68  130/62  136/68   Heart Rate (Admit)  71 bpm  70 bpm  84 bpm  83 bpm  75 bpm   Heart Rate (Exercise)  111 bpm  113 bpm  115 bpm  111 bpm  119 bpm   Heart Rate (Exit)  71 bpm  78 bpm  78 bpm  75 bpm   74 bpm   Rating of Perceived Exertion (Exercise)  '11  12  11  11  9   '$ Symptoms  no documentation  no documentation  no documentation  no documentation  None   Comments  Pt first day of exercise  no documentation  no documentation  no documentation  no documentation   Duration  Continue with 30 min of aerobic exercise without signs/symptoms of physical distress.  Continue with 30 min of aerobic exercise without signs/symptoms of physical distress.  Continue with 30 min of aerobic exercise without signs/symptoms of physical distress.  Continue with 30 min of aerobic exercise without signs/symptoms of physical distress.  Continue with 30 min of aerobic exercise without signs/symptoms of physical distress.   Intensity  THRR unchanged  THRR unchanged  THRR unchanged  THRR unchanged  THRR unchanged       Progression    Row Name 05/17/18 1528 05/30/18 1600 06/03/18 1000 06/28/18 1500 08/01/18 1500   Progression  Continue to progress workloads to maintain intensity without signs/symptoms of physical distress.  Continue to progress workloads to maintain intensity without signs/symptoms of physical distress.  Continue to progress workloads to maintain intensity without signs/symptoms of physical distress.  Continue to progress workloads to maintain intensity without signs/symptoms of physical distress.  Continue to progress workloads to maintain intensity without signs/symptoms of physical distress.   Average METs  2.67  2.95  3.2  3.3  3.2       Resistance Training    Row Name 05/17/18 1528 05/30/18 1600 06/03/18 1000 06/28/18 1500 08/01/18 1500   Training Prescription  Yes  Yes  No  Yes  Yes   Weight  4 lbs.  4 lbs.  no documentation  5 lbs.   5 lbs.    Reps  10-15  10-15  no documentation  10-15  10-15   Time  10 Minutes  10 Minutes  no documentation  10 Minutes  [place] Name 05/17/18 1528 05/30/18 1600 06/03/18 1000 06/28/18 1500 08/01/18 1500   Level  '3  4  4  5  6   '$ Minutes   '10  10  10  10  10   '$ METs  2.7  2.7  3.57  3.57  3.73       NuStep    Row Name 05/17/18 1528 05/30/18 1600 06/03/18 1000 06/28/18 1500 08/01/18 1500   Level  '3  4  4  5  5   '$ SPM  85  85  85  85  85   Minutes  '10  10  10  10  '$ 10  METs  2.4  3.4  2.7  3.1  3.2       Track    Row Name 05/17/18 1528 05/30/18 1600 06/03/18 1000 06/28/18 1500 08/01/18 1500   Laps  '11  14  13  14  11   '$ Minutes  '10  10  10  10  10   '$ METs  2.91  3.45  2.91  3.45  2.91       Home Exercise Plan    Row Name 05/17/18 1528 05/30/18 1600 06/03/18 1000 06/28/18 1500 08/01/18 1500   Plans to continue exercise at  no documentation  Home (comment) Pt is exercising at the [facility].   Home (comment) Pt is exercising at the [facility].   Home (comment) Pt is exercising at the [facility].   Home (comment) Pt is exercising at the [facility].    Frequency  no documentation  Add 3 additional days to program exercise sessions.  Add 3 additional days to program exercise sessions.  Add 3 additional days to program exercise sessions.  Add 3 additional days to program exercise sessions.   Initial Home Exercises Provided  no documentation  05/30/18  05/30/18  05/30/18  05/30/18          Exercise Comments: Exercise Comments    Row Name 05/18/18 1451 05/30/18 1613 06/08/18 1018 06/28/18 1542 08/04/18 1038   Exercise Comments  Reviewed METs and goals with PT. Plans to continue exercising at the [facility] in addition to cardiac rehab.   Reviewed HEP with Pt. Pt was responsive and plans to continue exercising at the [facility] 2-4 days per week for 30-45 minutes.   Reviewed METs and goals with Pt. Pt will continue going to the [facility] for exercise.   Reviewed METs and goals with Pt. Pt will continue going to the [facility] for exercise.   Reviewed METs and goals with Pt. Pt will continue going to the [facility] for exercise.       Exercise Goals and Review: Exercise Goals    Exercise Goals    Row Name 05/10/18 1001   Increase Physical Activity  Yes   Intervention  Provide  advice, education, support and counseling about physical activity/exercise needs.;Develop an individualized exercise prescription for aerobic and resistive training based on initial evaluation findings, risk stratification, comorbidities and participant's personal goals.   Expected Outcomes  Short Term: Attend rehab on a regular basis to increase amount of physical activity.   Increase Strength and Stamina  Yes   Intervention  Provide advice, education, support and counseling about physical activity/exercise needs.;Develop an individualized exercise prescription for aerobic and resistive training based on initial evaluation findings, risk stratification, comorbidities and participant's personal goals.   Expected Outcomes  Short Term: Increase workloads from initial exercise prescription for resistance, speed, and METs.   Able to understand and use rate of perceived exertion (RPE) scale  Yes   Intervention  Provide education and explanation on how to use RPE scale   Expected Outcomes  Short Term: Able to use RPE daily in rehab to express subjective intensity level;Long Term:  Able to use RPE to guide intensity level when exercising independently   Knowledge and understanding of Target Heart Rate Range (THRR)  Yes   Intervention  Provide education and explanation of THRR including how the numbers were predicted and where they are located for reference   Expected Outcomes  Short Term: Able to state/look up THRR;Long Term: Able to use THRR to govern intensity when exercising independently;Short Term: Able  to use daily as guideline for intensity in rehab   Able to check pulse independently  Yes   Intervention  Provide education and demonstration on how to check pulse in carotid and radial arteries.;Review the importance of being able to check your own pulse for safety during independent exercise   Expected Outcomes  Short Term: Able to explain why pulse checking is important during independent exercise;Long  Term: Able to check pulse independently and accurately   Understanding of Exercise Prescription  Yes   Intervention  Provide education, explanation, and written materials on patient's individual exercise prescription   Expected Outcomes  Short Term: Able to explain program exercise prescription;Long Term: Able to explain home exercise prescription to exercise independently          Exercise Goals Re-Evaluation : Exercise Goals Re-Evaluation    Exercise Goal Re-Evaluation    Row Name 05/18/18 1448 05/30/18 1611 06/08/18 1017 06/28/18 1541 08/04/18 1038   Exercise Goals Review  Increase Physical Activity;Increase Strength and Stamina;Able to understand and use rate of perceived exertion (RPE) scale;Knowledge and understanding of Target Heart Rate Range (THRR);Understanding of Exercise Prescription;Able to check pulse independently  Increase Physical Activity;Increase Strength and Stamina;Able to understand and use rate of perceived exertion (RPE) scale;Knowledge and understanding of Target Heart Rate Range (THRR);Understanding of Exercise Prescription;Able to check pulse independently  Increase Physical Activity;Increase Strength and Stamina;Able to understand and use rate of perceived exertion (RPE) scale;Knowledge and understanding of Target Heart Rate Range (THRR);Understanding of Exercise Prescription;Able to check pulse independently  Increase Physical Activity;Increase Strength and Stamina;Able to understand and use rate of perceived exertion (RPE) scale;Knowledge and understanding of Target Heart Rate Range (THRR);Understanding of Exercise Prescription;Able to check pulse independently  Increase Physical Activity;Increase Strength and Stamina;Able to understand and use rate of perceived exertion (RPE) scale;Knowledge and understanding of Target Heart Rate Range (THRR);Understanding of Exercise Prescription;Able to check pulse independently   Comments  Reviewed METs and goals with Pt. Pt is new to  the program and has a MET level of 2.67. Pt is currently going to the [facility] 2 days a week for 2 hours.   Reviewed HEP with Pt. Pt was responsive and understands THRR, exercise precautions, NTG use, end points of exercise, and weather precautions. Pt plans on exercising at the [facility] in addition to cardiac reahb.   Reviewed METs and goals with Pt. MET level is 3.2 and continues to increase with each session. Pt exercises at the [facility] in addition to Cardiac Rehab.   Reviewed METs and goals with Pt. MET level is 3.3 and continues to increase with each session. Pt exercises at the [facility] in addition to Cardiac Rehab.   Reviewed METs and goals with Pt. MET level is 3.3 and continues to increase with each session. Pt exercises at the [facility] for 1 hour 1-2 days per week in addition to Cardiac Rehab.    Expected Outcomes  Will continue to monitor and progress Pt as tolerated.   Will continue to monitor and progress Pt as tolerated.   Will continue to monitor and progress Pt as tolerated.   Will continue to monitor and progress Pt as tolerated.   Will continue to monitor and progress Pt as tolerated.           Discharge Exercise Prescription (Final Exercise Prescription Changes): Exercise Prescription Changes - 08/01/18 1500    Response to Exercise          Blood Pressure (Admit)  138/78    Blood Pressure (  Exercise)  142/70    Blood Pressure (Exit)  136/68    Heart Rate (Admit)  75 bpm    Heart Rate (Exercise)  119 bpm    Heart Rate (Exit)  74 bpm    Rating of Perceived Exertion (Exercise)  9    Symptoms  None    Duration  Continue with 30 min of aerobic exercise without signs/symptoms of physical distress.    Intensity  THRR unchanged        Progression          Progression  Continue to progress workloads to maintain intensity without signs/symptoms of physical distress.    Average METs  3.2        Resistance Training          Training Prescription  Yes    Weight  5 lbs.     Reps  10-15    Time  10  Minutes        Bike          Level  6    Minutes  10    METs  3.73        NuStep          Level  5    SPM  85    Minutes  10    METs  3.2        Track          Laps  11    Minutes  10    METs  2.91        Home Exercise Plan          Plans to continue exercise at  Home (comment)   Pt is exercising at the [facility].    Frequency  Add 3 additional days to program exercise sessions.    Initial Home Exercises Provided  05/30/18           Nutrition:  Target Goals: Understanding of nutrition guidelines, daily intake of sodium '1500mg'$ , cholesterol '200mg'$ , calories 30% from fat and 7% or less from saturated fats, daily to have 5 or more servings of fruits and vegetables.  Biometrics: Pre Biometrics - 05/10/18 0959    Pre Biometrics          Height  5' 11.5" (1.816 m)    Weight  100.5 kg    Waist Circumference  42 inches    Hip Circumference  49 inches    Waist to Hip Ratio  0.86 %    BMI (Calculated)  30.47    Triceps Skinfold  25 mm    % Body Fat  31.7 %    Grip Strength  33 kg    Flexibility  13 in    Single Leg Stand  7.4 seconds          Post Biometrics - 08/01/18 1457     Post  Biometrics          Height  5' 11.5" (1.816 m)    Weight  99.9 kg    Waist Circumference  42 inches    Hip Circumference  48.5 inches    Waist to Hip Ratio  0.87 %    BMI (Calculated)  30.29    Triceps Skinfold  25 mm    % Body Fat  31.6 %    Grip Strength  32.5 kg    Flexibility  15 in    Single Leg Stand  8.12 seconds  Nutrition Therapy Plan and Nutrition Goals: Nutrition Therapy & Goals - 05/10/18 1033    Nutrition Therapy          Diet  heart healthy, carb modified        Personal Nutrition Goals          Nutrition Goal  Pt to identify and limit food sources of saturated fat, trans fat, refined carbohydrates and sodium    Personal Goal #2  Pt to eat a small lunch meal instead of skipping lunch    Personal Goal #3  Pt able to name foods that affect  blood glucose.        Intervention Plan          Intervention  Prescribe, educate and counsel regarding individualized specific dietary modifications aiming towards targeted core components such as weight, hypertension, lipid management, diabetes, heart failure and other comorbidities.    Expected Outcomes  Short Term Goal: Understand basic principles of dietary content, such as calories, fat, sodium, cholesterol and nutrients.;Long Term Goal: Adherence to prescribed nutrition plan.           Nutrition Assessments: Nutrition Assessments - 05/10/18 1034    MEDFICTS Scores          Pre Score  42           Nutrition Goals Re-Evaluation:   Nutrition Goals Re-Evaluation:   Nutrition Goals Discharge (Final Nutrition Goals Re-Evaluation):   Psychosocial: Target Goals: Acknowledge presence or absence of significant depression and/or stress, maximize coping skills, provide positive support system. Participant is able to verbalize types and ability to use techniques and skills needed for reducing stress and depression.  Initial Review & Psychosocial Screening: Initial Psych Review & Screening - 05/10/18 1213    Initial Review          Current issues with  None Identified        Family Dynamics          Good Support System?  Yes   FAMILY        Barriers          Psychosocial barriers to participate in program  There are no identifiable barriers or psychosocial needs.        Screening Interventions          Interventions  Encouraged to exercise           Quality of Life Scores: Quality of Life - 05/10/18 1004    Quality of Life          Select  Quality of Life          Scores of 19 and below usually indicate a poorer quality of life in these areas.  A difference of  2-3 points is a clinically meaningful difference.  A difference of 2-3 points in the total score of the Quality of Life Index has been associated with significant improvement in overall quality of  life, self-image, physical symptoms, and general health in studies assessing change in quality of life.  PHQ-9: Recent Review Flowsheet Data    Depression screen [facility] 2/9 05/16/2018   Decreased Interest 0   Down, Depressed, Hopeless 0   PHQ - 2 Score 0     Interpretation of Total Score  Total Score Depression Severity:  1-4 = Minimal depression, 5-9 = Mild depression, 10-14 = Moderate depression, 15-19 = Moderately severe depression, 20-27 = Severe depression   Psychosocial Evaluation and Intervention: Psychosocial Evaluation - 05/16/18 1434    Psychosocial Evaluation &  Interventions          Interventions  Encouraged to exercise with the program and follow exercise prescription    Comments  no psychosocial needs identified, no interventions necessary     Expected Outcomes  pt will exhibit positive outlook with good coping skills.    Continue Psychosocial Services   No Follow up required           Psychosocial Re-Evaluation: Psychosocial Re-Evaluation    Psychosocial Re-Evaluation    Row Name 05/19/18 1216 06/03/18 1537 07/01/18 0743 08/04/18 1001   Current issues with  None Identified  None Identified  None Identified  None Identified   Comments  no psychosocial needs identified, no interventions necessary   no psychosocial needs identified, no interventions necessary   no psychosocial needs identified, no interventions necessary   no psychosocial needs identified, no interventions necessary    Expected Outcomes  pt will exhibit positive outlook with good coping skills.   pt will exhibit positive outlook with good coping skills.   pt will exhibit positive outlook with good coping skills.   pt will exhibit positive outlook with good coping skills.    Interventions  Encouraged to attend Cardiac Rehabilitation for the exercise  Encouraged to attend Cardiac Rehabilitation for the exercise  Encouraged to attend Cardiac Rehabilitation for the exercise  Encouraged to attend Cardiac  Rehabilitation for the exercise   Continue Psychosocial Services   No Follow up required  No Follow up required  No Follow up required  No Follow up required          Psychosocial Discharge (Final Psychosocial Re-Evaluation): Psychosocial Re-Evaluation - 08/04/18 1001    Psychosocial Re-Evaluation          Current issues with  None Identified    Comments  no psychosocial needs identified, no interventions necessary     Expected Outcomes  pt will exhibit positive outlook with good coping skills.     Interventions  Encouraged to attend Cardiac Rehabilitation for the exercise    Continue Psychosocial Services   No Follow up required           Vocational Rehabilitation: Provide vocational rehab assistance to qualifying candidates.   Vocational Rehab Evaluation & Intervention: Vocational Rehab - 05/10/18 1214    Initial Vocational Rehab Evaluation & Intervention          Assessment shows need for Vocational Rehabilitation  No           Education: Education Goals: Education classes will be provided on a weekly basis, covering required topics. Participant will state understanding/return demonstration of topics presented.  Learning Barriers/Preferences: Learning Barriers/Preferences - 05/10/18 1004    Learning Barriers/Preferences          Learning Barriers  Sight   Memory deficits.    Learning Preferences  Video;Pictoral           Education Topics: Count Your Pulse:  -Group instruction provided by verbal instruction, demonstration, patient participation and written materials to support subject.  Instructors address importance of being able to find your pulse and how to count your pulse when at home without a heart monitor.  Patients get hands on experience counting their pulse with staff help and individually.   Heart Attack, Angina, and Risk Factor Modification:  -Group instruction provided by verbal instruction, video, and written materials to support subject.   Instructors address signs and symptoms of angina and heart attacks.    Also discuss risk factors for heart disease and how  to make changes to improve heart health risk factors. Flowsheet Row CARDIAC REHAB PHASE II EXERCISE from 08/03/2018 in [place]  Date  06/08/18  Instruction Review Code  2- Demonstrated Understanding      Functional Fitness:  -Group instruction provided by verbal instruction, demonstration, patient participation, and written materials to support subject.  Instructors address safety measures for doing things around the house.  Discuss how to get up and down off the floor, how to pick things up properly, how to safely get out of a chair without assistance, and balance training.   Meditation and Mindfulness:  -Group instruction provided by verbal instruction, patient participation, and written materials to support subject.  Instructor addresses importance of mindfulness and meditation practice to help reduce stress and improve awareness.  Instructor also leads participants through a meditation exercise.    Stretching for Flexibility and Mobility:  -Group instruction provided by verbal instruction, patient participation, and written materials to support subject.  Instructors lead participants through series of stretches that are designed to increase flexibility thus improving mobility.  These stretches are additional exercise for major muscle groups that are typically performed during regular warm up and cool down.   Hands Only CPR:  -Group verbal, video, and participation provides a basic overview of AHA guidelines for community CPR. Role-play of emergencies allow participants the opportunity to practice calling for help and chest compression technique with discussion of AED use.   Hypertension: -Group verbal and written instruction that provides a basic overview of hypertension including the most recent diagnostic guidelines, risk factor  reduction with self-care instructions and medication management.    Nutrition I class: Heart Healthy Eating:  -Group instruction provided by PowerPoint slides, verbal discussion, and written materials to support subject matter. The instructor gives an explanation and review of the Therapeutic Lifestyle Changes diet recommendations, which includes a discussion on lipid goals, dietary fat, sodium, fiber, plant stanol/sterol esters, sugar, and the components of a well-balanced, healthy diet.   Nutrition II class: Lifestyle Skills:  -Group instruction provided by PowerPoint slides, verbal discussion, and written materials to support subject matter. The instructor gives an explanation and review of label reading, grocery shopping for heart health, heart healthy recipe modifications, and ways to make healthier choices when eating out.   Diabetes Question & Answer:  -Group instruction provided by PowerPoint slides, verbal discussion, and written materials to support subject matter. The instructor gives an explanation and review of diabetes co-morbidities, pre- and post-prandial blood glucose goals, pre-exercise blood glucose goals, signs, symptoms, and treatment of hypoglycemia and hyperglycemia, and foot care basics.   Diabetes Blitz:  -Group instruction provided by PowerPoint slides, verbal discussion, and written materials to support subject matter. The instructor gives an explanation and review of the physiology behind type 1 and type 2 diabetes, diabetes medications and rational behind using different medications, pre- and post-prandial blood glucose recommendations and Hemoglobin A1c goals, diabetes diet, and exercise including blood glucose guidelines for exercising safely.    Portion Distortion:  -Group instruction provided by PowerPoint slides, verbal discussion, written materials, and food models to support subject matter. The instructor gives an explanation of serving size versus portion  size, changes in portions sizes over the last 20 years, and what consists of a serving from each food group.   Stress Management:  -Group instruction provided by verbal instruction, video, and written materials to support subject matter.  Instructors review role of stress in heart disease and how to cope with  stress positively.   Flowsheet Row CARDIAC REHAB PHASE II EXERCISE from 08/03/2018 in [facility]  Date  07/27/18  Educator  RN  Instruction Review Code  2- Demonstrated Understanding      Exercising on Your Own:  -Group instruction provided by verbal instruction, power point, and written materials to support subject.  Instructors discuss benefits of exercise, components of exercise, frequency and intensity of exercise, and end points for exercise.  Also discuss use of nitroglycerin and activating EMS.  Review options of places to exercise outside of rehab.  Review guidelines for sex with heart disease. Flowsheet Row CARDIAC REHAB PHASE II EXERCISE from 08/03/2018 in [place]  Date  06/15/18  Educator  EP  Instruction Review Code  2- Demonstrated Understanding      Cardiac Drugs I:  -Group instruction provided by verbal instruction and written materials to support subject.  Instructor reviews cardiac drug classes: antiplatelets, anticoagulants, beta blockers, and statins.  Instructor discusses reasons, side effects, and lifestyle considerations for each drug class. Flowsheet Row CARDIAC REHAB PHASE II EXERCISE from 08/03/2018 in [facility]  Date  07/06/18  Instruction Review Code  2- Demonstrated Understanding      Cardiac Drugs II:  -Group instruction provided by verbal instruction and written materials to support subject.  Instructor reviews cardiac drug classes: angiotensin converting enzyme inhibitors (ACE-I), angiotensin II receptor blockers (ARBs), nitrates, and calcium channel  blockers.  Instructor discusses reasons, side effects, and lifestyle considerations for each drug class. Flowsheet Row CARDIAC REHAB PHASE II EXERCISE from 08/03/2018 in [place]  Date  06/01/18  Educator  Pharmacist  Instruction Review Code  2- Demonstrated Understanding      Anatomy and Physiology of the Circulatory System:  Group verbal and written instruction and models provide basic cardiac anatomy and physiology, with the coronary electrical and arterial systems. Review of: AMI, Angina, Valve disease, Heart Failure, Peripheral Artery Disease, Cardiac Arrhythmia, Pacemakers, and the ICD. Flowsheet Row CARDIAC REHAB PHASE II EXERCISE from 08/03/2018 in [place]  Date  08/03/18  Instruction Review Code  2- Demonstrated Understanding      Other Education:  -Group or individual verbal, written, or video instructions that support the educational goals of the cardiac rehab program.   Holiday Eating Survival Tips:  -Group instruction provided by PowerPoint slides, verbal discussion, and written materials to support subject matter. The instructor gives patients tips, tricks, and techniques to help them not only survive but enjoy the holidays despite the onslaught of food that accompanies the holidays.   Knowledge Questionnaire Score: Knowledge Questionnaire Score - 05/10/18 0931    Knowledge Questionnaire Score          Pre Score  18/24           Core Components/Risk Factors/Patient Goals at Admission: Personal Goals and Risk Factors at Admission - 05/10/18 1005    Core Components/Risk Factors/Patient Goals on Admission           Weight Management  Yes;Weight Maintenance;Weight Loss    Intervention  Weight Management: Develop a combined nutrition and exercise program designed to reach desired caloric intake, while maintaining appropriate intake of nutrient and fiber, sodium and fats, and appropriate energy expenditure  required for the weight goal.;Weight Management: Provide education and appropriate resources to help participant work on and attain dietary goals.;Weight Management/Obesity: Establish reasonable short term and long term weight goals.  Admit Weight  221 lb 9 oz (100.5 kg)    Expected Outcomes  Short Term: Continue to assess and modify interventions until short term weight is achieved;Long Term: Adherence to nutrition and physical activity/exercise program aimed toward attainment of established weight goal;Weight Maintenance: Understanding of the daily nutrition guidelines, which includes 25-35% calories from fat, 7% or less cal from saturated fats, less than '200mg'$  cholesterol, less than 1.5gm of sodium, & 5 or more servings of fruits and vegetables daily;Weight Loss: Understanding of general recommendations for a balanced deficit meal plan, which promotes 1-2 lb weight loss per week and includes a negative energy balance of [phone] kcal/d;Understanding recommendations for meals to include 15-35% energy as protein, 25-35% energy from fat, 35-60% energy from carbohydrates, less than '200mg'$  of dietary cholesterol, 20-35 gm of total fiber daily;Understanding of distribution of calorie intake throughout the day with the consumption of 4-5 meals/snacks    Hypertension  Yes    Intervention  Provide education on lifestyle modifcations including regular physical activity/exercise, weight management, moderate sodium restriction and increased consumption of fresh fruit, vegetables, and low fat dairy, alcohol moderation, and smoking cessation.;Monitor prescription use compliance.    Expected Outcomes  Short Term: Continued assessment and intervention until BP is < 140/66m HG in hypertensive participants. < 130/855mHG in hypertensive participants with diabetes, heart failure or chronic kidney disease.;Long Term: Maintenance of blood pressure at goal levels.    Lipids  Yes    Intervention  Provide education and support  for participant on nutrition & aerobic/resistive exercise along with prescribed medications to achieve LDL '70mg'$ , HDL >'40mg'$ .    Expected Outcomes  Short Term: Participant states understanding of desired cholesterol values and is compliant with medications prescribed. Participant is following exercise prescription and nutrition guidelines.;Long Term: Cholesterol controlled with medications as prescribed, with individualized exercise RX and with personalized nutrition plan. Value goals: LDL < '70mg'$ , HDL > 40 mg.    Stress  Yes    Intervention  Offer individual and/or small group education and counseling on adjustment to heart disease, stress management and health-related lifestyle change. Teach and support self-help strategies.;Refer participants experiencing significant psychosocial distress to appropriate mental health specialists for further evaluation and treatment. When possible, include family members and significant others in education/counseling sessions.    Expected Outcomes  Short Term: Participant demonstrates changes in health-related behavior, relaxation and other stress management skills, ability to obtain effective social support, and compliance with psychotropic medications if prescribed.;Long Term: Emotional wellbeing is indicated by absence of clinically significant psychosocial distress or social isolation.           Core Components/Risk Factors/Patient Goals Review:  Goals and Risk Factor Review    Core Components/Risk Factors/Patient Goals Review    Row Name 05/16/18 1434 06/03/18 1537 07/01/18 0743 08/04/18 0959   Personal Goals Review  Weight Management/Obesity;Hypertension;Lipids;Stress  Weight Management/Obesity;Hypertension;Lipids;Stress  Weight Management/Obesity;Hypertension;Lipids;Stress  Weight Management/Obesity;Hypertension;Lipids;Stress   Review  pt with multiple CAD RF demonstrates willingness to participate in CR activities. pt personal goals are to increase  strength/stamina.   pt with multiple CAD RF demonstrates willingness to participate in CR activities. pt personal goals are to increase strength/stamina. pt c/o fatigue and DOE climbing stairs at home. pt will add stairs to walking track at CR.  [facility].    pt with multiple CAD RF demonstrates willingness to participate in CR activities. pt personal goals are to increase strength/stamina. pt c/o fatigue and DOE climbing stairs at  home. pt will add stairs to walking track at CR.  pt goes to [facility] using airdyne and TM twice weekly.  pt reports comfort in gaining knowledge about lifestyle modifications and heart disease.   pt with multiple CAD RF demonstrates willingness to participate in CR activities. pt personal goals are to increase strength/stamina.  pt will add stairs to walking track at CR. pt reports decreased DOE and fatigue with stair climbing at home.  pt goes to [employer] and TM twice weekly.    Expected Outcomes  pt will participate in CR exercise, nutrition and lifestyle modification opportunities.   pt will participate in CR exercise, nutrition and lifestyle modification opportunities.   pt will participate in CR exercise, nutrition and lifestyle modification opportunities.   pt will participate in CR exercise, nutrition and lifestyle modification opportunities.           Core Components/Risk Factors/Patient Goals at Discharge (Final Review):  Goals and Risk Factor Review - 08/04/18 0959    Core Components/Risk Factors/Patient Goals Review          Personal Goals Review  Weight Management/Obesity;Hypertension;Lipids;Stress    Review  pt with multiple CAD RF demonstrates willingness to participate in CR activities. pt personal goals are to increase strength/stamina.  pt will add stairs to walking track at CR. pt reports decreased DOE and fatigue with stair climbing at home.  pt goes to [employer] and TM twice weekly.     Expected  Outcomes  pt will participate in CR exercise, nutrition and lifestyle modification opportunities.            ITP Comments: ITP Comments    Row Name 05/10/18 5170 05/16/18 1432 06/03/18 1536 07/06/18 1635 08/04/18 1002   ITP Comments  Dr. Fransico Him, Medical Director   pt started group exercise. pt tolerated light activity without difficulty. pt oriented to exercise equipment and safety routine.   30 day ITP review. pt with good attendance and participation. pt demonstrates eagerness to participate in CR program.   30 day ITP review. pt with good attendance and participation. pt demonstrates eagerness to participate in CR program.   30 day ITP review. pt with good attendance and participation. pt demonstrates eagerness to participate in CR program.       Comments:

## 2018-08-05 ENCOUNTER — Ambulatory Visit (HOSPITAL_COMMUNITY): Payer: Medicare HMO

## 2018-08-05 ENCOUNTER — Encounter (HOSPITAL_COMMUNITY)
Admission: RE | Admit: 2018-08-05 | Discharge: 2018-08-05 | Disposition: A | Discharge status: Home / Self Care | Payer: Medicare HMO | Source: Ambulatory Visit | Attending: Cardiology | Admitting: Cardiology

## 2018-08-05 ENCOUNTER — Telehealth: Payer: Self-pay

## 2018-08-05 DIAGNOSIS — Z955 Presence of coronary angioplasty implant and graft: Secondary | ICD-10-CM

## 2018-08-05 MED ORDER — METOPROLOL SUCCINATE ER 50 MG PO TB24: 50.0000 mg | ORAL_TABLET | Freq: Every day | ORAL | 0 refills | Status: DC

## 2018-08-05 NOTE — Telephone Encounter (Signed)
Patient notified of frequent atrial premature contractions seen on monitor per Dr Bettina Gavia.  Patient advised to increase metoprolol 25mg  daily to metoprolol 50mg  daily.  Rx for metoprolol XL 50mg  once daily sent to pharmacy.  Patient agreed to plan and verbalized understanding.

## 2018-08-08 ENCOUNTER — Encounter (HOSPITAL_COMMUNITY)
Admission: RE | Admit: 2018-08-08 | Discharge: 2018-08-08 | Disposition: A | Discharge status: Home / Self Care | Payer: Medicare HMO | Source: Ambulatory Visit | Attending: Cardiology | Admitting: Cardiology

## 2018-08-08 ENCOUNTER — Ambulatory Visit (HOSPITAL_COMMUNITY): Payer: Medicare HMO

## 2018-08-08 DIAGNOSIS — Z955 Presence of coronary angioplasty implant and graft: Secondary | ICD-10-CM

## 2018-08-10 ENCOUNTER — Encounter (HOSPITAL_COMMUNITY)
Admission: RE | Admit: 2018-08-10 | Discharge: 2018-08-10 | Disposition: A | Discharge status: Home / Self Care | Payer: Medicare HMO | Source: Ambulatory Visit | Attending: Cardiology | Admitting: Cardiology

## 2018-08-10 ENCOUNTER — Ambulatory Visit (HOSPITAL_COMMUNITY): Payer: Medicare HMO

## 2018-08-10 DIAGNOSIS — Z955 Presence of coronary angioplasty implant and graft: Secondary | ICD-10-CM | POA: Diagnosis not present

## 2018-08-12 ENCOUNTER — Ambulatory Visit (HOSPITAL_COMMUNITY): Payer: Medicare HMO

## 2018-08-12 ENCOUNTER — Encounter (HOSPITAL_COMMUNITY)
Admission: RE | Admit: 2018-08-12 | Discharge: 2018-08-12 | Disposition: A | Discharge status: Home / Self Care | Payer: Medicare HMO | Source: Ambulatory Visit | Attending: Cardiology | Admitting: Cardiology

## 2018-08-12 DIAGNOSIS — Z955 Presence of coronary angioplasty implant and graft: Secondary | ICD-10-CM | POA: Diagnosis not present

## 2018-08-15 ENCOUNTER — Ambulatory Visit (HOSPITAL_COMMUNITY): Payer: Medicare HMO

## 2018-08-15 ENCOUNTER — Encounter (HOSPITAL_COMMUNITY)
Admission: RE | Admit: 2018-08-15 | Discharge: 2018-08-15 | Disposition: A | Discharge status: Home / Self Care | Payer: Medicare HMO | Source: Ambulatory Visit | Attending: Cardiology | Admitting: Cardiology

## 2018-08-15 DIAGNOSIS — Z955 Presence of coronary angioplasty implant and graft: Secondary | ICD-10-CM

## 2018-08-17 ENCOUNTER — Encounter (HOSPITAL_COMMUNITY)
Admission: RE | Admit: 2018-08-17 | Discharge: 2018-08-17 | Disposition: A | Discharge status: Home / Self Care | Payer: Medicare HMO | Source: Ambulatory Visit | Attending: Cardiology | Admitting: Cardiology

## 2018-08-17 ENCOUNTER — Ambulatory Visit (HOSPITAL_COMMUNITY): Payer: Medicare HMO

## 2018-08-17 DIAGNOSIS — Z955 Presence of coronary angioplasty implant and graft: Secondary | ICD-10-CM | POA: Diagnosis not present

## 2018-09-01 DIAGNOSIS — B354 Tinea corporis: Secondary | ICD-10-CM | POA: Diagnosis not present

## 2018-09-08 DIAGNOSIS — R040 Epistaxis: Secondary | ICD-10-CM | POA: Diagnosis not present

## 2018-09-08 DIAGNOSIS — R899 Unspecified abnormal finding in specimens from other organs, systems and tissues: Secondary | ICD-10-CM | POA: Diagnosis not present

## 2018-09-08 DIAGNOSIS — Z8679 Personal history of other diseases of the circulatory system: Secondary | ICD-10-CM | POA: Diagnosis not present

## 2018-09-19 DIAGNOSIS — D649 Anemia, unspecified: Secondary | ICD-10-CM | POA: Diagnosis not present

## 2018-10-10 ENCOUNTER — Other Ambulatory Visit: Payer: Self-pay

## 2018-10-10 ENCOUNTER — Telehealth: Payer: Self-pay | Admitting: Cardiology

## 2018-10-10 ENCOUNTER — Encounter: Payer: Self-pay | Admitting: Cardiology

## 2018-10-10 ENCOUNTER — Telehealth (INDEPENDENT_AMBULATORY_CARE_PROVIDER_SITE_OTHER): Payer: Medicare HMO | Admitting: Cardiology

## 2018-10-10 VITALS — BP 140/56 | HR 74 | Ht 73.0 in | Wt 216.4 lb

## 2018-10-10 DIAGNOSIS — I25118 Atherosclerotic heart disease of native coronary artery with other forms of angina pectoris: Secondary | ICD-10-CM | POA: Diagnosis not present

## 2018-10-10 DIAGNOSIS — N183 Chronic kidney disease, stage 3 unspecified: Secondary | ICD-10-CM

## 2018-10-10 DIAGNOSIS — E785 Hyperlipidemia, unspecified: Secondary | ICD-10-CM

## 2018-10-10 DIAGNOSIS — I119 Hypertensive heart disease without heart failure: Secondary | ICD-10-CM

## 2018-10-10 DIAGNOSIS — I350 Nonrheumatic aortic (valve) stenosis: Secondary | ICD-10-CM

## 2018-10-10 DIAGNOSIS — I4892 Unspecified atrial flutter: Secondary | ICD-10-CM

## 2018-10-10 NOTE — Telephone Encounter (Signed)
Virtual Visit Pre-Appointment Phone Call  Steps For Call:  1. Confirm consent - "In the setting of the current Covid19 crisis, you are scheduled for a (phone or video) visit with your provider on (date) at (time).  Just as we do with many in-office visits, in order for you to participate in this visit, we must obtain consent.  If you'd like, I can send this to your mychart (if signed up) or email for you to review.  Otherwise, I can obtain your verbal consent now.  All virtual visits are billed to your insurance company just like a normal visit would be.  By agreeing to a virtual visit, we'd like you to understand that the technology does not allow for your provider to perform an examination, and thus may limit your provider's ability to fully assess your condition.  Finally, though the technology is pretty good, we cannot assure that it will always work on either your or our end, and in the setting of a video visit, we may have to convert it to a phone-only visit.  In either situation, we cannot ensure that we have a secure connection.  Are you willing to proceed?"  2. Give patient instructions for WebEx download to smartphone as below if video visit  3. Advise patient to be prepared with any vital sign or heart rhythm information, their current medicines, and a piece of paper and pen handy for any instructions they may receive the day of their visit  4. Inform patient they will receive a phone call 15 minutes prior to their appointment time (may be from unknown caller ID) so they should be prepared to answer  5. Confirm that appointment type is correct in Epic appointment notes (video vs telephone)    TELEPHONE CALL NOTE  Steven [name] has been deemed a candidate for a follow-up tele-health visit to limit community exposure during the Covid-19 pandemic. I spoke with the patient via phone to ensure availability of phone/video source, confirm preferred email & phone number, and discuss  instructions and expectations.  I reminded Steven [name] to be prepared with any vital sign and/or heart rhythm information that could potentially be obtained via home monitoring, at the time of his visit. I reminded Steven [name] to expect a phone call at the time of his visit if his visit.  Did the patient verbally acknowledge consent to treatment? YES  Steven [name] 10/10/2018 10:26 AM   DOWNLOADING THE WEBEX SOFTWARE TO SMARTPHONE  - If Apple, go to [employer] and type in WebEx in the search bar. [place] [employer], the blue/green circle. The app is free but as with any other app downloads, their phone may require them to verify saved payment information or Apple password. The patient does NOT have to create an account.  - If Android, ask patient to go to [employer] and type in WebEx in the search bar. [place] [employer], the blue/green circle. The app is free but as with any other app downloads, their phone may require them to verify saved payment information or Android password. The patient does NOT have to create an account.   CONSENT FOR TELE-HEALTH VISIT - PLEASE REVIEW  I hereby voluntarily request, consent and authorize [facility] and its employed or contracted physicians, physician assistants, nurse practitioners or other licensed health care professionals (the Practitioner), to provide me with telemedicine health care services (the Services") as deemed necessary by the treating Practitioner. I  acknowledge and consent to receive the Services by the Practitioner via telemedicine. I understand that the telemedicine visit will involve communicating with the Practitioner through live audiovisual communication technology and the disclosure of certain medical information by electronic transmission. I acknowledge that I have been given the opportunity to request an in-person assessment or other available alternative prior to the telemedicine visit and am  voluntarily participating in the telemedicine visit.  I understand that I have the right to withhold or withdraw my consent to the use of telemedicine in the course of my care at any time, without affecting my right to future care or treatment, and that the Practitioner or I may terminate the telemedicine visit at any time. I understand that I have the right to inspect all information obtained and/or recorded in the course of the telemedicine visit and may receive copies of available information for a reasonable fee.  I understand that some of the potential risks of receiving the Services via telemedicine include:   Delay or interruption in medical evaluation due to technological equipment failure or disruption;  Information transmitted may not be sufficient (e.g. poor resolution of images) to allow for appropriate medical decision making by the Practitioner; and/or   In rare instances, security protocols could fail, causing a breach of personal health information.  Furthermore, I acknowledge that it is my responsibility to provide information about my medical history, conditions and care that is complete and accurate to the best of my ability. I acknowledge that Practitioner's advice, recommendations, and/or decision may be based on factors not within their control, such as incomplete or inaccurate data provided by me or distortions of diagnostic images or specimens that may result from electronic transmissions. I understand that the practice of medicine is not an exact science and that Practitioner makes no warranties or guarantees regarding treatment outcomes. I acknowledge that I will receive a copy of this consent concurrently upon execution via email to the email address I last provided but may also request a printed copy by calling the office of [facility].    I understand that my insurance will be billed for this visit.   I have read or had this consent read to me.  I understand the  contents of this consent, which adequately explains the benefits and risks of the Services being provided via telemedicine.   I have been provided ample opportunity to ask questions regarding this consent and the Services and have had my questions answered to my satisfaction.  I give my informed consent for the services to be provided through the use of telemedicine in my medical care  By participating in this telemedicine visit I agree to the above.     Cardiac Questionnaire:    Since your last visit or hospitalization:    1. Have you been having new or worsening chest pain? no   2. Have you been having new or worsening shortness of breath? no 3. Have you been having new or worsening leg swelling, wt gain, or increase in abdominal girth (pants fitting more tightly)? no   4. Have you had any passing out spells? no    *A YES to any of these questions would result in the appointment being kept. *If all the answers to these questions are NO, we should indicate that given the current situation regarding the worldwide coronarvirus pandemic, at the recommendation of the CDC, we are looking to limit gatherings in our waiting area, and thus will reschedule their appointment beyond four  weeks from today.   _____________   [id number] Pre-Screening Questions:   Do you currently have a fever? no (yes = cancel and refer to pcp for e-visit)  Have you recently travelled on a cruise, internationally, or to [place], [place], [place], [place], [place], or [place], [place]) ? no (yes = cancel, stay home, monitor symptoms, and contact pcp or initiate e-visit if symptoms develop)  Have you been in contact with someone that is currently pending confirmation of Covid19 testing or has been confirmed to have the Covid19 virus?  no (yes = cancel, stay home, away from tested individual, monitor symptoms, and contact pcp or initiate e-visit if symptoms develop)  Are you currently experiencing fatigue or cough? no (yes = pt  should be prepared to have a mask placed at the time of their visit).

## 2018-10-10 NOTE — Progress Notes (Signed)
Virtual Visit via Video Note   This visit type was conducted due to national recommendations for restrictions regarding the COVID-19 Pandemic (e.g. social distancing) in an effort to limit this patient's exposure and mitigate transmission in our community.  Due to his co-morbid illnesses, this patient is at least at moderate risk for complications without adequate follow up.  This format is felt to be most appropriate for this patient at this time.  All issues noted in this document were discussed and addressed.  A limited physical exam was performed with this format.  Please refer to the patient's chart for his consent to telehealth for [facility].   Evaluation Performed:  Follow-up visit  Date:  10/10/2018   ID:  Steven [name], DOB December 10, 1934, MRN [id number]  Patient Location: Home  Provider Location: Home  PCP:  Hulan Fess, MD  Cardiologist:  No primary care provider on file. Dr Bettina Gavia Electrophysiologist:  None   Chief Complaint:  FU for CAD  History of Present Illness:    Steven [name] is a 84 y.o. male who presents via audio/video conferencing for a telehealth visit today.    Steven [name] is a 84 y.o. male with a hx of CAD with PCI and stent left anterior descending coronary artery 02/24/2018, mild aortic valve stenosis, atrial flutter with EP ablation in September of  2017, hypertension, hyperlipidemia and obesity last seen 04/27/18  His visit started off video I was able to examine him the images froze and we finished as a telephone visit.  He is practicing isolation social distancing wears a mask and good handwashing technique.  He has had no fever chills shortness of breath.  He has had no angina edema shortness of breath palpitation or syncope.  The patient does not have symptoms concerning for COVID-19 infection (fever, chills, cough, or new shortness of breath).    Past Medical History:  Diagnosis Date  . Atrial flutter ([place])   . CAD (coronary artery  disease)    02/24/18 PCI/DES x1 to the pLAD  . Carpal tunnel syndrome   . Cataract    left eye  . Chest pain   . CKD (chronic kidney disease), stage III ([place])   . Enlarged prostate   . History of gout   . History of kidney stones    pt has one now but not giving him any problems  . Numbness    both hands pt states pinched. 12-17-14 Gabapentin has improved.  . Pneumonia    history of pneumonia  . Psoriasis    Past Surgical History:  Procedure Laterality Date  . CATARACT EXTRACTION W/PHACO Left 03/15/2013   Procedure: CATARACT EXTRACTION PHACO AND INTRAOCULAR LENS PLACEMENT (IOC);  Surgeon: Adonis Brook, MD;  Location: [place];  Service: Ophthalmology;  Laterality: Left;  . COLONOSCOPY    . COLONOSCOPY WITH PROPOFOL N/A 12/24/2014   Procedure: COLONOSCOPY WITH PROPOFOL;  Surgeon: Garlan Fair, MD;  Location: [facility];  Service: Endoscopy;  Laterality: N/A;  . CORONARY STENT INTERVENTION N/A 02/24/2018   Procedure: CORONARY STENT INTERVENTION;  Surgeon: Jettie Booze, MD;  Location: [place] [facility];  Service: Cardiovascular;  Laterality: N/A;  . ELECTROPHYSIOLOGIC STUDY N/A 02/28/2016   Procedure: A-Flutter Ablation;  Surgeon: Deboraha Sprang, MD;  Location: [place] [facility];  Service: Cardiovascular;  Laterality: N/A;  . right cataract removed     . RIGHT/LEFT HEART CATH AND CORONARY ANGIOGRAPHY N/A 02/24/2018   Procedure: RIGHT/LEFT HEART CATH AND CORONARY  ANGIOGRAPHY;  Surgeon: Jettie Booze, MD;  Location: [place] [facility];  Service: Cardiovascular;  Laterality: N/A;  . TONSILLECTOMY     as child     Current Meds  Medication Sig  . aspirin EC 81 MG tablet Take 1 tablet (81 mg total) by mouth daily.  Marland Kitchen atorvastatin (LIPITOR) 40 MG tablet Take 40 mg by mouth daily.  . clopidogrel (PLAVIX) 75 MG tablet Take 1 tablet (75 mg total) by mouth daily.  . Cyanocobalamin (VITAMIN B-12 PO) Take 1 tablet by mouth daily.  . metoprolol succinate (TOPROL-XL) 25 MG 24 hr  tablet Take 25 mg by mouth daily.  . nitroGLYCERIN (NITROSTAT) 0.4 MG SL tablet Place 1 tablet (0.4 mg total) under the tongue every 5 (five) minutes as needed.  . Omega-3 Fatty Acids (FISH OIL) 1000 MG CAPS Take 1,000 mg by mouth daily.  . [DISCONTINUED] metoprolol succinate (TOPROL-XL) 50 MG 24 hr tablet Take 1 tablet (50 mg total) by mouth daily.     Allergies:   Patient has no known allergies.   Social History   Tobacco Use  . Smoking status: Former Smoker    Types: Cigars  . Smokeless tobacco: Former Systems developer    Types: Chew  . Tobacco comment: smoked cigars 22yrs ago  Substance Use Topics  . Alcohol use: No  . Drug use: No     Family Hx: The patient's family history includes Diabetes in his son; Healthy in his father; Kidney failure in his sister and sister; Other in his mother. There is no history of Heart attack.  ROS:   Please see the history of present illness.     All other systems reviewed and are negative.   Prior CV studies:   The following studies were reviewed today: Left heart cath 02/24/09  Ost RCA lesion is 25% stenosed.  Prox LAD lesion is 80% stenosed.  A drug-eluting stent was successfully placed using a STENT SIERRA 3.50 X 15 MM.  Post intervention, there is a 0% residual stenosis.  Mid LAD lesion is 40% stenosed.  Mid Cx lesion is 10% stenosed.  There is mild aortic valve stenosis.  The left ventricular systolic function is normal.  LV end diastolic pressure is mildly elevated. LVEDP 21 mm Hg.  The left ventricular ejection fraction is 55-65% by visual estimate. Left Ventricle The left ventricular size is normal. The left ventricular systolic function is normal. LV end diastolic pressure is mildly elevated. The left ventricular ejection fraction is 55-65% by visual estimate. No regional wall motion abnormalities.  Aortic Valve There is mild aortic valve stenosis. Ao valve area 1.7 cm 2.  Mean gradient 26 mm Hg.    Labs/Other Tests and Data  Reviewed:    EKG:  An ECG dated 04/27/18 was personally reviewed today and demonstrated:  [place] and normal  Recent Labs:  O1/07/20 CMP Cr 1.13 GFR 66 cc/min Chol 118 HDL 43 LDL 67 normal LFT K 4.7   02/24/2018: Hemoglobin 11.4; Platelets 168 04/27/2018: ALT 17; BUN 16; Creatinine, Ser 1.05; Potassium 4.6; Sodium 143   Recent Lipid Panel Lab Results  Component Value Date/Time   CHOL 113 04/27/2018 11:20 AM   TRIG 64 04/27/2018 11:20 AM   HDL 46 04/27/2018 11:20 AM   CHOLHDL 2.5 04/27/2018 11:20 AM   CHOLHDL 3.2 01/11/2018 07:01 AM   LDLCALC 54 04/27/2018 11:20 AM    Wt Readings from Last 3 Encounters:  10/10/18 216 lb 6.4 oz (98.2 kg)  08/01/18 220 lb  3.8 oz (99.9 kg)  05/10/18 221 lb 9 oz (100.5 kg)     Objective:    Vital Signs:  BP (!) 140/56 (BP Location: Left Arm, Patient Position: Standing)   Pulse 74   Ht 6\' 1"  (1.854 m)   Wt 216 lb 6.4 oz (98.2 kg)   BMI 28.55 kg/m    Constitutional, well-nourished well-developed in no acute distress Vital signs reviewed Eyes, conjunctiva and sclera are normal without pallor or icterus extraocular motions intact and normal there is no lid lag Respiratory, normal effort and excursion no audible wheezing without a stethoscope Cardiovascular, no neck vein distention or peripheral edema Skin, no rash skin lesion or ulceration of the extremities Neurologic, cranial nerves II to XII are grossly intact and the patient moves all 4 extremities Neuro/Psychiatric, judgment and thought processes are intact and coherent, alert and oriented x3, mood and affect appear normal.  ASSESSMENT & PLAN:    1. CAD, stable continue current medical treatment I will see him in the office in September and likely discontinue aspirin and keep him on clopidogrel.  Continue medical therapy including his high intensity statin and over-the-counter fish oil 2. Stable blood pressures at target continue metoprolol 3. Hyperlipidemia stable lipids are ideal continue  statin 4. CKD stable improved on most recent labs January 5. Aortic stenosis mild we will consider follow-up echocardiogram in his visit this fall  COVID-19 Education: The signs and symptoms of COVID-19 were discussed with the patient and how to seek care for testing (follow up with PCP or arrange E-visit).  The importance of social distancing was discussed today.  Time:   Today, I have spent 32 minutes with the patient with telehealth technology discussing the above problems.     Medication Adjustments/Labs and Tests Ordered: Current medicines are reviewed at length with the patient today.  Concerns regarding medicines are outlined above.  Tests Ordered: No orders of the defined types were placed in this encounter.  Medication Changes: No orders of the defined types were placed in this encounter.   Disposition:  Follow up in 6 month(s)  Signed, Shirlee More, MD  10/10/2018 10:42 AM    [facility]

## 2018-10-10 NOTE — Patient Instructions (Addendum)
Called patient 10/10/18 at 1108. Discussed AVS and scheduled 5 month  appointment.  Medication Instructions:  Your physician recommends that you continue on your current medications as directed. Please refer to the Current Medication list given to you today.  If you need a refill on your cardiac medications before your next appointment, please call your pharmacy.   Lab work: None  If you have labs (blood work) drawn today and your tests are completely normal, you will receive your results only by: Marland Kitchen MyChart Message (if you have MyChart) OR . A paper copy in the mail If you have any lab test that is abnormal or we need to change your treatment, we will call you to review the results.  Testing/Procedures: None  Follow-Up: At [facility], you and your health needs are our priority.  As part of our continuing mission to provide you with exceptional heart care, we have created designated Provider Care Teams.  These Care Teams include your primary Cardiologist (physician) and Advanced Practice Providers (APPs -  Physician Assistants and Nurse Practitioners) who all work together to provide you with the care you need, when you need it. You will need a follow up appointment in 5 months.   Any Other Special Instructions Will Be Listed Below (If Applicable).

## 2018-10-17 DIAGNOSIS — D649 Anemia, unspecified: Secondary | ICD-10-CM | POA: Diagnosis not present

## 2018-10-23 ENCOUNTER — Other Ambulatory Visit: Payer: Self-pay | Admitting: Cardiology

## 2018-10-24 NOTE — Telephone Encounter (Signed)
Rx refill sent to pharmacy. 

## 2018-10-28 ENCOUNTER — Telehealth: Payer: Self-pay | Admitting: *Deleted

## 2018-10-28 ENCOUNTER — Other Ambulatory Visit: Payer: Self-pay | Admitting: Cardiology

## 2018-10-28 MED ORDER — METOPROLOL SUCCINATE ER 25 MG PO TB24: 25.0000 mg | ORAL_TABLET | Freq: Every day | ORAL | 2 refills | Status: DC

## 2018-10-28 NOTE — Telephone Encounter (Signed)
Discontinued Metoprolol 50 mg due to dose change from last visit.

## 2018-10-28 NOTE — Telephone Encounter (Signed)
Rx refill sent to pharmacy. 

## 2018-10-31 ENCOUNTER — Encounter: Payer: Self-pay | Admitting: Internal Medicine

## 2018-10-31 ENCOUNTER — Telehealth: Payer: Self-pay | Admitting: Internal Medicine

## 2018-10-31 NOTE — Telephone Encounter (Signed)
Received a new hem referral from Dr. Hulan Fess for persistent anemia. Pt has been cld and scheduled to see Dr. Walden Field on 5/29 at 11am. Letter mailed.

## 2018-11-07 DIAGNOSIS — R21 Rash and other nonspecific skin eruption: Secondary | ICD-10-CM | POA: Diagnosis not present

## 2018-11-08 ENCOUNTER — Encounter: Payer: Self-pay | Admitting: Internal Medicine

## 2018-11-25 ENCOUNTER — Other Ambulatory Visit: Payer: Self-pay

## 2018-11-25 ENCOUNTER — Inpatient Hospital Stay: Payer: Medicare HMO

## 2018-11-25 ENCOUNTER — Inpatient Hospital Stay: Payer: Medicare HMO | Attending: Internal Medicine | Admitting: Internal Medicine

## 2018-11-25 VITALS — BP 170/60 | HR 61 | Temp 98.0°F | Resp 17 | Ht 73.0 in | Wt 219.8 lb

## 2018-11-25 DIAGNOSIS — Z87891 Personal history of nicotine dependence: Secondary | ICD-10-CM | POA: Insufficient documentation

## 2018-11-25 DIAGNOSIS — I119 Hypertensive heart disease without heart failure: Secondary | ICD-10-CM | POA: Insufficient documentation

## 2018-11-25 DIAGNOSIS — D72818 Other decreased white blood cell count: Secondary | ICD-10-CM

## 2018-11-25 DIAGNOSIS — E785 Hyperlipidemia, unspecified: Secondary | ICD-10-CM | POA: Diagnosis not present

## 2018-11-25 DIAGNOSIS — I4892 Unspecified atrial flutter: Secondary | ICD-10-CM | POA: Insufficient documentation

## 2018-11-25 DIAGNOSIS — Z8619 Personal history of other infectious and parasitic diseases: Secondary | ICD-10-CM | POA: Diagnosis not present

## 2018-11-25 DIAGNOSIS — I2510 Atherosclerotic heart disease of native coronary artery without angina pectoris: Secondary | ICD-10-CM | POA: Diagnosis not present

## 2018-11-25 DIAGNOSIS — D72819 Decreased white blood cell count, unspecified: Secondary | ICD-10-CM | POA: Insufficient documentation

## 2018-11-25 DIAGNOSIS — I350 Nonrheumatic aortic (valve) stenosis: Secondary | ICD-10-CM | POA: Diagnosis not present

## 2018-11-25 DIAGNOSIS — Z79899 Other long term (current) drug therapy: Secondary | ICD-10-CM | POA: Diagnosis not present

## 2018-11-25 DIAGNOSIS — M25552 Pain in left hip: Secondary | ICD-10-CM | POA: Insufficient documentation

## 2018-11-25 DIAGNOSIS — D538 Other specified nutritional anemias: Secondary | ICD-10-CM | POA: Insufficient documentation

## 2018-11-25 DIAGNOSIS — M25559 Pain in unspecified hip: Secondary | ICD-10-CM

## 2018-11-25 DIAGNOSIS — N183 Chronic kidney disease, stage 3 (moderate): Secondary | ICD-10-CM | POA: Diagnosis not present

## 2018-11-25 DIAGNOSIS — E669 Obesity, unspecified: Secondary | ICD-10-CM | POA: Insufficient documentation

## 2018-11-25 DIAGNOSIS — M25551 Pain in right hip: Secondary | ICD-10-CM | POA: Insufficient documentation

## 2018-11-25 LAB — CMP (CANCER CENTER ONLY)
ALT: 14 U/L (ref 0–44)
AST: 20 U/L (ref 15–41)
Albumin: 3.7 g/dL (ref 3.5–5.0)
Alkaline Phosphatase: 61 U/L (ref 38–126)
Anion gap: 7 (ref 5–15)
BUN: 17 mg/dL (ref 8–23)
CO2: 29 mmol/L (ref 22–32)
Calcium: 9 mg/dL (ref 8.9–10.3)
Chloride: 106 mmol/L (ref 98–111)
Creatinine: 1.23 mg/dL (ref 0.61–1.24)
GFR, Est AFR Am: >60 mL/min (ref >60)
GFR, Estimated: 54 mL/min — ABNORMAL LOW (ref >60)
Glucose, Bld: 89 mg/dL (ref 70–99)
Potassium: 4.2 mmol/L (ref 3.5–5.1)
Sodium: 142 mmol/L (ref 135–145)
Total Bilirubin: 0.4 mg/dL (ref 0.3–1.2)
Total Protein: 7.5 g/dL (ref 6.5–8.1)

## 2018-11-25 LAB — IRON AND TIBC
Iron: 77 ug/dL (ref 42–163)
Saturation Ratios: 31 % (ref 20–55)
TIBC: 249 ug/dL (ref 202–409)
UIBC: 172 ug/dL (ref 117–376)

## 2018-11-25 LAB — CBC WITH DIFFERENTIAL (CANCER CENTER ONLY)
Abs Immature Granulocytes: 0.01 10*3/uL (ref 0.00–0.07)
Basophils Absolute: 0 10*3/uL (ref 0.0–0.1)
Basophils Relative: 1 %
Eosinophils Absolute: 0.1 10*3/uL (ref 0.0–0.5)
Eosinophils Relative: 2 %
HCT: 36.1 % — ABNORMAL LOW (ref 39.0–52.0)
Hemoglobin: 11.6 g/dL — ABNORMAL LOW (ref 13.0–17.0)
Immature Granulocytes: 0 %
Lymphocytes Relative: 37 %
Lymphs Abs: 1.2 10*3/uL (ref 0.7–4.0)
MCH: 28.6 pg (ref 26.0–34.0)
MCHC: 32.1 g/dL (ref 30.0–36.0)
MCV: 89.1 fL (ref 80.0–100.0)
Monocytes Absolute: 0.4 10*3/uL (ref 0.1–1.0)
Monocytes Relative: 13 %
Neutro Abs: 1.5 10*3/uL — ABNORMAL LOW (ref 1.7–7.7)
Neutrophils Relative %: 47 %
Platelet Count: 171 10*3/uL (ref 150–400)
RBC: 4.05 MIL/uL — ABNORMAL LOW (ref 4.22–5.81)
RDW: 13.5 % (ref 11.5–15.5)
WBC Count: 3.2 10*3/uL — ABNORMAL LOW (ref 4.0–10.5)
nRBC: 0 % (ref 0.0–0.2)

## 2018-11-25 LAB — FERRITIN: Ferritin: 182 ng/mL (ref 24–336)

## 2018-11-25 LAB — SEDIMENTATION RATE: Sed Rate: 24 mm/hr — ABNORMAL HIGH (ref 0–16)

## 2018-11-25 LAB — LACTATE DEHYDROGENASE: LDH: 137 U/L (ref 98–192)

## 2018-11-25 LAB — VITAMIN B12: Vitamin B-12: 3635 pg/mL — ABNORMAL HIGH (ref 180–914)

## 2018-11-25 LAB — FOLATE: Folate: 11.2 ng/mL (ref >5.9)

## 2018-11-25 NOTE — Progress Notes (Signed)
Referring Physician:  Hulan Fess, MD  Diagnosis Other decreased white blood cell (WBC) count - Plan: CBC with Differential ([place] Only), CMP ([place] only), Lactate dehydrogenase (LDH), Sedimentation rate, Ferritin, Iron and TIBC, Folate, Serum, Vitamin B12, Methylmalonic acid, serum, SPEP with reflex to IFE, Hemoglobinopathy evaluation, Rheumatoid factor, ANA, IFA (with reflex), Hepatitis B surface antibody, Hepatitis B surface antigen, Hepatitis B core antibody, total, Hepatitis C antibody, HIV antibody (with reflex)  Other specified nutritional anemias - Plan: CBC with Differential ([place] Only), CMP ([place] only), Lactate dehydrogenase (LDH), Sedimentation rate, Ferritin, Iron and TIBC, Folate, Serum, Vitamin B12, Methylmalonic acid, serum, SPEP with reflex to IFE, Hemoglobinopathy evaluation, Rheumatoid factor, ANA, IFA (with reflex), Hepatitis B surface antibody, Hepatitis B surface antigen, Hepatitis B core antibody, total, Hepatitis C antibody, HIV antibody (with reflex)  Pain in joint involving pelvic region and thigh, unspecified laterality - Plan: CBC with Differential (Cancer Center Only), CMP ([place] only), Lactate dehydrogenase (LDH), Sedimentation rate, Ferritin, Iron and TIBC, Folate, Serum, Vitamin B12, Methylmalonic acid, serum, SPEP with reflex to IFE, Hemoglobinopathy evaluation, Rheumatoid factor, ANA, IFA (with reflex), Hepatitis B surface antibody, Hepatitis B surface antigen, Hepatitis B core antibody, total, Hepatitis C antibody, HIV antibody (with reflex)  Staging Cancer Staging No matching staging information was found for the patient.  Assessment and Plan:  1.  Leukopenia.   84 year old male referred for evaluation due to leukopenia and anemia.  Pt had labs done 10/31/2018 and showed WBC 3.8 HB 12.8 plts 165,000.  Cr 1.13 K+ 4.7 normal LFTs.  Labs done 09/08/2018 showed WBC 3.8 HB 11.9 plts 180,000.  Pt reports he was told in the past by [employer] he had hepatitis so he could not donate blood.  Pt denies any fevers, chills, night sweats and has noted no adenopathy.  He reports occasional joint pain.  Pt is seen today for consultation due to leukopenia and anemia.    Labs done today 11/25/2018 reviewed and showed WBC 3.2 HB 11.6 plts 171,000.  He has normal differential.  Chemistries showed K+ 4.2 Cr 1.23 and normal LFTs.  Awaiting results of Hep panel and HIV.  Pt will have  Phone visit follow-up to go over results.  Suspect normal variant leukopenia.    2.  Anemia.  Hb adequate at 11.6.  Ferritin WNL a t182.  Awaiting results of Folate, Serum, Vitamin B12, Methylmalonic acid, serum, SPEP with reflex to IFE, Hemoglobinopathy evaluation.  Pt will have phone visit follow-up in 2 weeks to go over results.    3.  Reported hepatitis history.  Pt reports he was told he could not donate blood at [facility] due to hepatitis.  Awaiting results of Hepatitis and HIV.  Pt will have phone visit follow-up in 2 weeks to go over results.    4  Joint pain.  Awaiting results of Rheumatoid factor, ANA, Sed rate and SPEP.  Phone follow-up in 2 weeks to go over results.    5.  CAD.  Pt reports he had stent placement.  He is on Plavix and ASA.  Follow-up with cardiology as recommended.    30 minutes spent with more than 50% spent in review of records, counseling and coordination of care.    HPI:  84 year old male referred for evaluation due to leukopenia and anemia.  Pt had labs done 10/31/2018 and showed WBC 3.8 HB 12.8 plts 165,000.  Cr 1.13 K+ 4.7 normal LFTs.  Labs done 09/08/2018  showed WBC 3.8 HB 11.9 plts 180,000.  Pt reports he was told in the past by [employer] he had hepatitis.  Pt denies any fevers, chills, night sweats and has noted no adenopathy.  He reports occasional joint pain.  Pt is seen today for consultation due to leukopenia and anemia.    Problem List Patient Active Problem List   Diagnosis Date Noted  . CAD (coronary artery disease),  native coronary artery [I25.10] 04/11/2018  . Hypertensive heart disease without CHF [I11.9] 04/11/2018  . Hyperlipidemia [E78.5] 04/11/2018  . Obesity (BMI 30-39.9) [E66.9] 04/11/2018  . Nonrheumatic aortic valve stenosis [I35.0]   . CKD (chronic kidney disease), stage III ([place]) [N18.3]   . Atrial flutter ([place]) [I48.92] 02/28/2016  . Leukocytopenia [D72.819] 03/11/2015  . Deficiency anemia [D53.9] 02/04/2015    Past Medical History Past Medical History:  Diagnosis Date  . Atrial flutter ([place])   . CAD (coronary artery disease)    02/24/18 PCI/DES x1 to the pLAD  . Carpal tunnel syndrome   . Cataract    left eye  . Chest pain   . CKD (chronic kidney disease), stage III ([place])   . Enlarged prostate   . History of gout   . History of kidney stones    pt has one now but not giving him any problems  . Numbness    both hands pt states pinched. 12-17-14 Gabapentin has improved.  . Pneumonia    history of pneumonia  . Psoriasis     Past Surgical History Past Surgical History:  Procedure Laterality Date  . CATARACT EXTRACTION W/PHACO Left 03/15/2013   Procedure: CATARACT EXTRACTION PHACO AND INTRAOCULAR LENS PLACEMENT (IOC);  Surgeon: Adonis Brook, MD;  Location: [facility];  Service: Ophthalmology;  Laterality: Left;  . COLONOSCOPY    . COLONOSCOPY WITH PROPOFOL N/A 12/24/2014   Procedure: COLONOSCOPY WITH PROPOFOL;  Surgeon: Garlan Fair, MD;  Location: [facility];  Service: Endoscopy;  Laterality: N/A;  . CORONARY STENT INTERVENTION N/A 02/24/2018   Procedure: CORONARY STENT INTERVENTION;  Surgeon: Jettie Booze, MD;  Location: [facility];  Service: Cardiovascular;  Laterality: N/A;  . ELECTROPHYSIOLOGIC STUDY N/A 02/28/2016   Procedure: A-Flutter Ablation;  Surgeon: Deboraha Sprang, MD;  Location: [facility];  Service: Cardiovascular;  Laterality: N/A;  . right cataract removed     . RIGHT/LEFT HEART CATH AND CORONARY ANGIOGRAPHY N/A 02/24/2018   Procedure:  RIGHT/LEFT HEART CATH AND CORONARY ANGIOGRAPHY;  Surgeon: Jettie Booze, MD;  Location: [facility];  Service: Cardiovascular;  Laterality: N/A;  . TONSILLECTOMY     as child    Family History Family History  Problem Relation Age of Onset  . Other Mother        Psychologist, forensic  . Healthy Father   . Kidney failure Sister   . Kidney failure Sister   . Diabetes Son   . Heart attack Neg Hx      Social History  reports that he has quit smoking. His smoking use included cigars. He has quit using smokeless tobacco.  His smokeless tobacco use included chew. He reports that he does not drink alcohol or use drugs.  Medications  Current Outpatient Medications:  .  aspirin EC 81 MG tablet, Take 1 tablet (81 mg total) by mouth daily., Disp: 30 tablet, Rfl: 0 .  atorvastatin (LIPITOR) 40 MG tablet, Take 40 mg by mouth daily., Disp: , Rfl: 12 .  clopidogrel (PLAVIX) 75 MG  tablet, TAKE 1 TABLET BY MOUTH EVERY DAY, Disp: 90 tablet, Rfl: 1 .  clotrimazole-betamethasone (LOTRISONE) cream, Apply topically 2 (two) times daily., Disp: , Rfl:  .  Cyanocobalamin (VITAMIN B-12 PO), Take 1 tablet by mouth daily., Disp: , Rfl:  .  metoprolol succinate (TOPROL-XL) 50 MG 24 hr tablet, Take 50 mg by mouth daily., Disp: , Rfl:  .  Omega-3 Fatty Acids (FISH OIL) 1000 MG CAPS, Take 1,000 mg by mouth daily., Disp: , Rfl:  .  nitroGLYCERIN (NITROSTAT) 0.4 MG SL tablet, Place 1 tablet (0.4 mg total) under the tongue every 5 (five) minutes as needed. (Patient not taking: Reported on 11/25/2018), Disp: 25 tablet, Rfl: 2  Allergies Patient has no known allergies.  Review of Systems Review of Systems - Oncology ROS negative   Physical Exam  Vitals Wt Readings from Last 3 Encounters:  11/25/18 219 lb 12.8 oz (99.7 kg)  10/10/18 216 lb 6.4 oz (98.2 kg)  08/01/18 220 lb 3.8 oz (99.9 kg)   Temp Readings from Last 3 Encounters:  11/25/18 98 F (36.7 C) (Oral)  02/24/18 98.8 F (37.1 C) (Oral)   01/11/18 98.2 F (36.8 C) (Oral)   BP Readings from Last 3 Encounters:  11/25/18 (!) 170/60  10/10/18 (!) 140/56  04/27/18 134/62   Pulse Readings from Last 3 Encounters:  11/25/18 61  10/10/18 74  04/27/18 60   Constitutional: Well-developed, well-nourished, and in no distress.   HENT: Head: Normocephalic and atraumatic.  Mouth/Throat: No oropharyngeal exudate. Mucosa moist. Eyes: Pupils are equal, round, and reactive to light. Conjunctivae are normal. No scleral icterus.  Neck: Normal range of motion. Neck supple. No JVD present.  Cardiovascular: Normal rate, regular rhythm and normal heart sounds.  Exam reveals no gallop and no friction rub.   No murmur heard. Pulmonary/Chest: Effort normal and breath sounds normal. No respiratory distress. No wheezes.No rales.  Abdominal: Soft. Bowel sounds are normal. No distension. There is no tenderness. There is no guarding.  Musculoskeletal: No edema or tenderness.  Lymphadenopathy: No cervical,axillary or supraclavicular adenopathy.  Neurological: Alert and oriented to person, place, and time. No cranial nerve deficit.  Skin: Skin is warm and dry. No rash noted. No erythema. No pallor.  Psychiatric: Affect and judgment normal.   Labs Appointment on 11/25/2018  Component Date Value Ref Range Status  . Iron 11/25/2018 77  42 - 163 ug/dL Final  . TIBC 11/25/2018 249  202 - 409 ug/dL Final  . Saturation Ratios 11/25/2018 31  20 - 55 % Final  . UIBC 11/25/2018 172  117 - 376 ug/dL Final   Performed at [facility], [place] [address], [place], [place] [address]  . Ferritin 11/25/2018 182  24 - 336 ng/mL Final   Performed at [facility], [place] [address], [place], [place] [address]  . LDH 11/25/2018 137  98 - 192 U/L Final   Performed at [facility], [place] [address], [place], [place] [address]  . Sodium 11/25/2018 142  135 - 145 mmol/L Final  . Potassium 11/25/2018  4.2  3.5 - 5.1 mmol/L Final  . Chloride 11/25/2018 106  98 - 111 mmol/L Final  . CO2 11/25/2018 29  22 - 32 mmol/L Final  . Glucose, Bld 11/25/2018 89  70 - 99 mg/dL Final  . BUN 11/25/2018 17  8 - 23 mg/dL Final  . Creatinine 11/25/2018 1.23  0.61 - 1.24 mg/dL Final  . Calcium 11/25/2018 9.0  8.9 - 10.3 mg/dL Final  . Total Protein 11/25/2018 7.5  6.5 - 8.1 g/dL Final  . Albumin 11/25/2018 3.7  3.5 - 5.0 g/dL Final  . AST 11/25/2018 20  15 - 41 U/L Final  . ALT 11/25/2018 14  0 - 44 U/L Final  . Alkaline Phosphatase 11/25/2018 61  38 - 126 U/L Final  . Total Bilirubin 11/25/2018 0.4  0.3 - 1.2 mg/dL Final  . GFR, Est Non Af Am 11/25/2018 54* >60 mL/min Final  . GFR, Est AFR Am 11/25/2018 >60  >60 mL/min Final  . Anion gap 11/25/2018 7  5 - 15 Final   Performed at [facility], [place] [address] [place], [place] [address]  . WBC Count 11/25/2018 3.2* 4.0 - 10.5 K/uL Final  . RBC 11/25/2018 4.05* 4.22 - 5.81 MIL/uL Final  . Hemoglobin 11/25/2018 11.6* 13.0 - 17.0 g/dL Final  . HCT 11/25/2018 36.1* 39.0 - 52.0 % Final  . MCV 11/25/2018 89.1  80.0 - 100.0 fL Final  . MCH 11/25/2018 28.6  26.0 - 34.0 pg Final  . MCHC 11/25/2018 32.1  30.0 - 36.0 g/dL Final  . RDW 11/25/2018 13.5  11.5 - 15.5 % Final  . Platelet Count 11/25/2018 171  150 - 400 K/uL Final  . nRBC 11/25/2018 0.0  0.0 - 0.2 % Final  . Neutrophils Relative % 11/25/2018 47  % Final  . Neutro Abs 11/25/2018 1.5* 1.7 - 7.7 K/uL Final  . Lymphocytes Relative 11/25/2018 37  % Final  . Lymphs Abs 11/25/2018 1.2  0.7 - 4.0 K/uL Final  . Monocytes Relative 11/25/2018 13  % Final  . Monocytes Absolute 11/25/2018 0.4  0.1 - 1.0 K/uL Final  . Eosinophils Relative 11/25/2018 2  % Final  . Eosinophils Absolute 11/25/2018 0.1  0.0 - 0.5 K/uL Final  . Basophils Relative 11/25/2018 1  % Final  . Basophils Absolute 11/25/2018 0.0  0.0 - 0.1 K/uL Final  . Immature Granulocytes 11/25/2018 0  % Final  . Abs Immature  Granulocytes 11/25/2018 0.01  0.00 - 0.07 K/uL Final   Performed at [facility], [place] [address] [place], [place] [address]     Pathology Orders Placed This Encounter  Procedures  . CBC with Differential (Cancer Center Only)    Standing Status:   Future    Number of Occurrences:   1    Standing Expiration Date:   11/25/2019  . CMP ([place] only)    Standing Status:   Future    Number of Occurrences:   1    Standing Expiration Date:   11/25/2019  . Lactate dehydrogenase (LDH)    Standing Status:   Future    Number of Occurrences:   1    Standing Expiration Date:   11/25/2019  . Sedimentation rate    Standing Status:   Future    Number of Occurrences:   1    Standing Expiration Date:   11/25/2019  . Ferritin    Standing Status:   Future    Number of Occurrences:   1    Standing Expiration Date:   11/25/2019  . Iron and TIBC    Standing Status:   Future    Number of Occurrences:   1    Standing Expiration Date:   11/25/2019  . Folate, Serum    Standing Status:   Future    Number of Occurrences:   1    Standing  Expiration Date:   11/25/2019  . Vitamin B12    Standing Status:   Future    Number of Occurrences:   1    Standing Expiration Date:   11/25/2019  . Methylmalonic acid, serum    Standing Status:   Future    Number of Occurrences:   1    Standing Expiration Date:   11/25/2019  . SPEP with reflex to IFE    Standing Status:   Future    Number of Occurrences:   1    Standing Expiration Date:   11/25/2019  . Hemoglobinopathy evaluation    Standing Status:   Future    Number of Occurrences:   1    Standing Expiration Date:   11/25/2019  . Rheumatoid factor    Standing Status:   Future    Number of Occurrences:   1    Standing Expiration Date:   11/25/2019  . ANA, IFA (with reflex)    Standing Status:   Future    Number of Occurrences:   1    Standing Expiration Date:   11/25/2019  . Hepatitis B surface antibody    Standing Status:   Future     Number of Occurrences:   1    Standing Expiration Date:   11/25/2019  . Hepatitis B surface antigen    Standing Status:   Future    Number of Occurrences:   1    Standing Expiration Date:   11/25/2019  . Hepatitis B core antibody, total    Standing Status:   Future    Number of Occurrences:   1    Standing Expiration Date:   11/25/2019  . Hepatitis C antibody    Standing Status:   Future    Number of Occurrences:   1    Standing Expiration Date:   11/25/2019  . HIV antibody (with reflex)    Standing Status:   Future    Number of Occurrences:   1    Standing Expiration Date:   11/25/2019       Zoila Shutter MD

## 2018-11-26 LAB — HEPATITIS B SURFACE ANTIBODY,QUALITATIVE: Hep B S Ab: REACTIVE

## 2018-11-26 LAB — HEPATITIS B CORE ANTIBODY, TOTAL: Hep B Core Total Ab: POSITIVE — AB

## 2018-11-26 LAB — RHEUMATOID FACTOR: Rhuematoid fact SerPl-aCnc: <10 IU/mL (ref 0.0–13.9)

## 2018-11-26 LAB — HEPATITIS C ANTIBODY: HCV Ab: <0.1 s/co ratio (ref 0.0–0.9)

## 2018-11-26 LAB — HIV ANTIBODY (ROUTINE TESTING W REFLEX): HIV Screen 4th Generation wRfx: NONREACTIVE

## 2018-11-26 LAB — HEPATITIS B SURFACE ANTIGEN: Hepatitis B Surface Ag: NEGATIVE

## 2018-11-28 ENCOUNTER — Telehealth: Payer: Self-pay | Admitting: Internal Medicine

## 2018-11-28 LAB — PROTEIN ELECTROPHORESIS, SERUM, WITH REFLEX
A/G Ratio: 1.1 (ref 0.7–1.7)
Albumin ELP: 3.9 g/dL (ref 2.9–4.4)
Alpha-1-Globulin: 0.2 g/dL (ref 0.0–0.4)
Alpha-2-Globulin: 0.9 g/dL (ref 0.4–1.0)
Beta Globulin: 0.9 g/dL (ref 0.7–1.3)
Gamma Globulin: 1.4 g/dL (ref 0.4–1.8)
Globulin, Total: 3.4 g/dL (ref 2.2–3.9)
Total Protein ELP: 7.3 g/dL (ref 6.0–8.5)

## 2018-11-28 LAB — HEMOGLOBINOPATHY EVALUATION
Hgb A2 Quant: 2.6 % (ref 1.8–3.2)
Hgb A: 97.4 % (ref 96.4–98.8)
Hgb C: 0 %
Hgb F Quant: 0 % (ref 0.0–2.0)
Hgb S Quant: 0 %
Hgb Variant: 0 %

## 2018-11-28 LAB — ANTINUCLEAR ANTIBODIES, IFA: ANA Ab, IFA: NEGATIVE

## 2018-11-28 NOTE — Telephone Encounter (Signed)
Tried to reach regarding schedule °

## 2018-11-30 LAB — METHYLMALONIC ACID, SERUM: Methylmalonic Acid, Quantitative: 175 nmol/L (ref 0–378)

## 2018-12-08 ENCOUNTER — Telehealth: Payer: Self-pay | Admitting: Internal Medicine

## 2018-12-09 ENCOUNTER — Inpatient Hospital Stay: Payer: Medicare HMO | Attending: Internal Medicine | Admitting: Internal Medicine

## 2018-12-09 DIAGNOSIS — D649 Anemia, unspecified: Secondary | ICD-10-CM

## 2018-12-09 DIAGNOSIS — R768 Other specified abnormal immunological findings in serum: Secondary | ICD-10-CM | POA: Diagnosis not present

## 2018-12-09 DIAGNOSIS — D72818 Other decreased white blood cell count: Secondary | ICD-10-CM

## 2018-12-09 NOTE — Progress Notes (Signed)
Virtual Visit via Telephone Note  I connected with Steven [name] on 12/09/18 at  9:50 AM EDT by telephone and verified that I am speaking with the correct person using two identifiers.   I discussed the limitations, risks, security and privacy concerns of performing an evaluation and management service by telephone and the availability of in person appointments. I also discussed with the patient that there may be a patient responsible charge related to this service. The patient expressed understanding and agreed to proceed.  Interval History:  Historical data obtained from note dated 11/25/2018.  84 year old male referred for evaluation due to leukopenia and anemia.  Pt had labs done 10/31/2018 and showed WBC 3.8 HB 12.8 plts 165,000.  Cr 1.13 K+ 4.7 normal LFTs.  Labs done 09/08/2018 showed WBC 3.8 HB 11.9 plts 180,000.  Pt reports he was told in the past by [employer] he had hepatitis.  Pt denies any fevers, chills, night sweats and has noted no adenopathy.  He reports occasional joint pain.   Observations/Objective: Review of labs done 11/25/2018.    Assessment and Plan: 1.  Leukopenia.   84 year old male referred for evaluation due to leukopenia and anemia.  Pt had labs done 10/31/2018 and showed WBC 3.8 HB 12.8 plts 165,000.  Cr 1.13 K+ 4.7 normal LFTs.  Labs done 09/08/2018 showed WBC 3.8 HB 11.9 plts 180,000.  Pt reports he was told in the past by [employer] he had hepatitis so he could not donate blood.  Pt denies any fevers, chills, night sweats and has noted no adenopathy.  He reports occasional joint pain.  Pt is seen today for consultation due to leukopenia and anemia.    Labs done 11/25/2018 reviewed and showed WBC 3.2 HB 11.6 plts 171,000.  He has normal differential.  Chemistries showed K+ 4.2 Cr 1.23 and normal LFTs.  Pt has negative HIV.  He has + Hep B core ab and SAB but negative SAG.   Pt reassured findings are likely normal variant leukopenia or sequela of previous infection with Hepatitis B.   He will have repeat labs in 05/2019 for ongoing follow-up.    2.  Anemia.  Hb adequate at 11.6.  Ferritin WNL at 182.  He has normal Folate, Methylmalonic acid, SPEP and hemoglobin electrophoresis.  Pt will RTC in 05/2019 for repeat labs.    3.  Positive hepatitis serology.  Pt reports he was told he could not donate blood at [facility] due to hepatitis.  Pt has negative HIV test.  He had + Hepatitis B core ab and Surface ab.  Hep B surface ag negative.  Pt has evidence of prior exposure.  He should follow-up with PCP.   4  Joint pain. He has normal  Rheumatoid factor, ANA, Sed rate and SPEP.  Follow-up with PCP if ongoing symptoms.    5.  CAD.  Pt reports he had stent placement.  He is on Plavix and ASA.  Follow-up with cardiology as recommended.    Follow Up Instructions: Labs and follow-up in 05/2019.    I discussed the assessment and treatment plan with the patient. The patient was provided an opportunity to ask questions and all were answered. The patient agreed with the plan and demonstrated an understanding of the instructions.   The patient was advised to call back or seek an in-person evaluation if the symptoms worsen or if the condition fails to improve as anticipated.  I provided 15 minutes of  non-face-to-face time during this encounter.   Zoila Shutter, MD

## 2018-12-12 ENCOUNTER — Telehealth: Payer: Self-pay | Admitting: Internal Medicine

## 2018-12-12 NOTE — Telephone Encounter (Signed)
I will schedule once template is available

## 2018-12-27 DIAGNOSIS — H35033 Hypertensive retinopathy, bilateral: Secondary | ICD-10-CM | POA: Diagnosis not present

## 2018-12-27 DIAGNOSIS — H57813 Brow ptosis, bilateral: Secondary | ICD-10-CM | POA: Diagnosis not present

## 2018-12-27 DIAGNOSIS — H40013 Open angle with borderline findings, low risk, bilateral: Secondary | ICD-10-CM | POA: Diagnosis not present

## 2018-12-27 DIAGNOSIS — H43813 Vitreous degeneration, bilateral: Secondary | ICD-10-CM | POA: Diagnosis not present

## 2019-01-24 ENCOUNTER — Emergency Department (HOSPITAL_COMMUNITY): Payer: Medicare HMO

## 2019-01-24 ENCOUNTER — Other Ambulatory Visit: Payer: Self-pay

## 2019-01-24 ENCOUNTER — Inpatient Hospital Stay (HOSPITAL_COMMUNITY)
Admission: EM | Admit: 2019-01-24 | Discharge: 2019-01-27 | DRG: 871 | Disposition: A | Discharge status: Home / Self Care | Payer: Medicare HMO | Attending: Internal Medicine | Admitting: Internal Medicine

## 2019-01-24 DIAGNOSIS — T6701XA Heatstroke and sunstroke, initial encounter: Secondary | ICD-10-CM

## 2019-01-24 DIAGNOSIS — N390 Urinary tract infection, site not specified: Secondary | ICD-10-CM

## 2019-01-24 DIAGNOSIS — I2510 Atherosclerotic heart disease of native coronary artery without angina pectoris: Secondary | ICD-10-CM | POA: Diagnosis present

## 2019-01-24 DIAGNOSIS — I248 Other forms of acute ischemic heart disease: Secondary | ICD-10-CM | POA: Diagnosis not present

## 2019-01-24 DIAGNOSIS — Z8701 Personal history of pneumonia (recurrent): Secondary | ICD-10-CM

## 2019-01-24 DIAGNOSIS — B9620 Unspecified Escherichia coli [E. coli] as the cause of diseases classified elsewhere: Secondary | ICD-10-CM | POA: Diagnosis present

## 2019-01-24 DIAGNOSIS — A419 Sepsis, unspecified organism: Secondary | ICD-10-CM

## 2019-01-24 DIAGNOSIS — D631 Anemia in chronic kidney disease: Secondary | ICD-10-CM | POA: Diagnosis present

## 2019-01-24 DIAGNOSIS — R400 Somnolence: Secondary | ICD-10-CM | POA: Diagnosis not present

## 2019-01-24 DIAGNOSIS — Z20828 Contact with and (suspected) exposure to other viral communicable diseases: Secondary | ICD-10-CM | POA: Diagnosis not present

## 2019-01-24 DIAGNOSIS — I499 Cardiac arrhythmia, unspecified: Secondary | ICD-10-CM | POA: Diagnosis not present

## 2019-01-24 DIAGNOSIS — D6959 Other secondary thrombocytopenia: Secondary | ICD-10-CM | POA: Diagnosis present

## 2019-01-24 DIAGNOSIS — Z955 Presence of coronary angioplasty implant and graft: Secondary | ICD-10-CM | POA: Diagnosis not present

## 2019-01-24 DIAGNOSIS — R410 Disorientation, unspecified: Secondary | ICD-10-CM | POA: Diagnosis not present

## 2019-01-24 DIAGNOSIS — M109 Gout, unspecified: Secondary | ICD-10-CM | POA: Diagnosis present

## 2019-01-24 DIAGNOSIS — N400 Enlarged prostate without lower urinary tract symptoms: Secondary | ICD-10-CM | POA: Diagnosis present

## 2019-01-24 DIAGNOSIS — E785 Hyperlipidemia, unspecified: Secondary | ICD-10-CM | POA: Diagnosis present

## 2019-01-24 DIAGNOSIS — Z87442 Personal history of urinary calculi: Secondary | ICD-10-CM | POA: Diagnosis not present

## 2019-01-24 DIAGNOSIS — R4182 Altered mental status, unspecified: Secondary | ICD-10-CM | POA: Diagnosis not present

## 2019-01-24 DIAGNOSIS — R319 Hematuria, unspecified: Secondary | ICD-10-CM | POA: Diagnosis not present

## 2019-01-24 DIAGNOSIS — Z961 Presence of intraocular lens: Secondary | ICD-10-CM | POA: Diagnosis present

## 2019-01-24 DIAGNOSIS — N183 Chronic kidney disease, stage 3 (moderate): Secondary | ICD-10-CM | POA: Diagnosis present

## 2019-01-24 DIAGNOSIS — R0902 Hypoxemia: Secondary | ICD-10-CM | POA: Diagnosis not present

## 2019-01-24 DIAGNOSIS — L409 Psoriasis, unspecified: Secondary | ICD-10-CM | POA: Diagnosis present

## 2019-01-24 DIAGNOSIS — I4892 Unspecified atrial flutter: Secondary | ICD-10-CM | POA: Diagnosis not present

## 2019-01-24 DIAGNOSIS — Z7902 Long term (current) use of antithrombotics/antiplatelets: Secondary | ICD-10-CM | POA: Diagnosis not present

## 2019-01-24 DIAGNOSIS — Z87891 Personal history of nicotine dependence: Secondary | ICD-10-CM

## 2019-01-24 DIAGNOSIS — T679XXA Effect of heat and light, unspecified, initial encounter: Secondary | ICD-10-CM | POA: Diagnosis present

## 2019-01-24 DIAGNOSIS — I2489 Other forms of acute ischemic heart disease: Secondary | ICD-10-CM

## 2019-01-24 DIAGNOSIS — G9341 Metabolic encephalopathy: Secondary | ICD-10-CM

## 2019-01-24 DIAGNOSIS — Z03818 Encounter for observation for suspected exposure to other biological agents ruled out: Secondary | ICD-10-CM | POA: Diagnosis not present

## 2019-01-24 DIAGNOSIS — Z9842 Cataract extraction status, left eye: Secondary | ICD-10-CM

## 2019-01-24 DIAGNOSIS — Z79899 Other long term (current) drug therapy: Secondary | ICD-10-CM | POA: Diagnosis not present

## 2019-01-24 DIAGNOSIS — Z841 Family history of disorders of kidney and ureter: Secondary | ICD-10-CM

## 2019-01-24 DIAGNOSIS — R404 Transient alteration of awareness: Secondary | ICD-10-CM | POA: Diagnosis not present

## 2019-01-24 DIAGNOSIS — Z7982 Long term (current) use of aspirin: Secondary | ICD-10-CM | POA: Diagnosis not present

## 2019-01-24 DIAGNOSIS — Z9841 Cataract extraction status, right eye: Secondary | ICD-10-CM

## 2019-01-24 DIAGNOSIS — T678XXA Other effects of heat and light, initial encounter: Secondary | ICD-10-CM | POA: Diagnosis not present

## 2019-01-24 HISTORY — DX: Urinary tract infection, site not specified: N39.0

## 2019-01-24 HISTORY — DX: Heatstroke and sunstroke, initial encounter: T67.01XA

## 2019-01-24 HISTORY — DX: Metabolic encephalopathy: G93.41

## 2019-01-24 HISTORY — DX: Other forms of acute ischemic heart disease: I24.89

## 2019-01-24 HISTORY — DX: Sepsis, unspecified organism: A41.9

## 2019-01-24 HISTORY — DX: Other forms of acute ischemic heart disease: I24.8

## 2019-01-24 LAB — CBC WITH DIFFERENTIAL/PLATELET
Abs Immature Granulocytes: 0.02 10*3/uL (ref 0.00–0.07)
Basophils Absolute: 0 10*3/uL (ref 0.0–0.1)
Basophils Relative: 0 %
Eosinophils Absolute: 0 10*3/uL (ref 0.0–0.5)
Eosinophils Relative: 0 %
HCT: 36.1 % — ABNORMAL LOW (ref 39.0–52.0)
Hemoglobin: 12 g/dL — ABNORMAL LOW (ref 13.0–17.0)
Immature Granulocytes: 0 %
Lymphocytes Relative: 10 %
Lymphs Abs: 0.5 10*3/uL — ABNORMAL LOW (ref 0.7–4.0)
MCH: 29.1 pg (ref 26.0–34.0)
MCHC: 33.2 g/dL (ref 30.0–36.0)
MCV: 87.4 fL (ref 80.0–100.0)
Monocytes Absolute: 0 10*3/uL — ABNORMAL LOW (ref 0.1–1.0)
Monocytes Relative: 1 %
Neutro Abs: 4.4 10*3/uL (ref 1.7–7.7)
Neutrophils Relative %: 89 %
Platelets: 128 10*3/uL — ABNORMAL LOW (ref 150–400)
RBC: 4.13 MIL/uL — ABNORMAL LOW (ref 4.22–5.81)
RDW: 14 % (ref 11.5–15.5)
WBC: 5 10*3/uL (ref 4.0–10.5)
nRBC: 0 % (ref 0.0–0.2)

## 2019-01-24 LAB — TROPONIN I (HIGH SENSITIVITY)
Troponin I (High Sensitivity): 17 ng/L (ref <18)
Troponin I (High Sensitivity): 35 ng/L — ABNORMAL HIGH (ref <18)

## 2019-01-24 LAB — POCT I-STAT EG7
Acid-Base Excess: 1 mmol/L (ref 0.0–2.0)
Bicarbonate: 26.3 mmol/L (ref 20.0–28.0)
Calcium, Ion: 1.13 mmol/L — ABNORMAL LOW (ref 1.15–1.40)
HCT: 34 % — ABNORMAL LOW (ref 39.0–52.0)
Hemoglobin: 11.6 g/dL — ABNORMAL LOW (ref 13.0–17.0)
O2 Saturation: 65 %
Potassium: 4.1 mmol/L (ref 3.5–5.1)
Sodium: 140 mmol/L (ref 135–145)
TCO2: 28 mmol/L (ref 22–32)
pCO2, Ven: 41.5 mmHg — ABNORMAL LOW (ref 44.0–60.0)
pH, Ven: 7.409 (ref 7.250–7.430)
pO2, Ven: 33 mmHg (ref 32.0–45.0)

## 2019-01-24 LAB — RAPID URINE DRUG SCREEN, HOSP PERFORMED
Amphetamines: NOT DETECTED
Barbiturates: NOT DETECTED
Benzodiazepines: NOT DETECTED
Cocaine: NOT DETECTED
Opiates: NOT DETECTED
Tetrahydrocannabinol: NOT DETECTED

## 2019-01-24 LAB — COMPREHENSIVE METABOLIC PANEL
ALT: 19 U/L (ref 0–44)
AST: 28 U/L (ref 15–41)
Albumin: 3.8 g/dL (ref 3.5–5.0)
Alkaline Phosphatase: 58 U/L (ref 38–126)
Anion gap: 11 (ref 5–15)
BUN: 27 mg/dL — ABNORMAL HIGH (ref 8–23)
CO2: 23 mmol/L (ref 22–32)
Calcium: 9 mg/dL (ref 8.9–10.3)
Chloride: 103 mmol/L (ref 98–111)
Creatinine, Ser: 1.39 mg/dL — ABNORMAL HIGH (ref 0.61–1.24)
GFR calc Af Amer: 54 mL/min — ABNORMAL LOW (ref >60)
GFR calc non Af Amer: 47 mL/min — ABNORMAL LOW (ref >60)
Glucose, Bld: 93 mg/dL (ref 70–99)
Potassium: 4.4 mmol/L (ref 3.5–5.1)
Sodium: 137 mmol/L (ref 135–145)
Total Bilirubin: 0.9 mg/dL (ref 0.3–1.2)
Total Protein: 7.3 g/dL (ref 6.5–8.1)

## 2019-01-24 LAB — URINALYSIS, ROUTINE W REFLEX MICROSCOPIC
Bilirubin Urine: NEGATIVE
Glucose, UA: NEGATIVE mg/dL
Ketones, ur: NEGATIVE mg/dL
Nitrite: POSITIVE — AB
Protein, ur: NEGATIVE mg/dL
Specific Gravity, Urine: 1.01 (ref 1.005–1.030)
pH: 5 (ref 5.0–8.0)

## 2019-01-24 LAB — SARS CORONAVIRUS 2 BY RT PCR (HOSPITAL ORDER, PERFORMED IN ~~LOC~~ HOSPITAL LAB): SARS Coronavirus 2: NEGATIVE

## 2019-01-24 LAB — LACTIC ACID, PLASMA: Lactic Acid, Venous: 1.8 mmol/L (ref 0.5–1.9)

## 2019-01-24 LAB — PROTIME-INR
INR: 1.2 (ref 0.8–1.2)
Prothrombin Time: 14.8 seconds (ref 11.4–15.2)

## 2019-01-24 LAB — CK: Total CK: 153 U/L (ref 49–397)

## 2019-01-24 LAB — APTT: aPTT: 27 seconds (ref 24–36)

## 2019-01-24 MED ORDER — ACETAMINOPHEN 325 MG PO TABS
650.0000 mg | ORAL_TABLET | Freq: Once | ORAL | Status: AC
  Administered 2019-01-24: 650 mg via ORAL
  Filled 2019-01-24: qty 2

## 2019-01-24 MED ORDER — SODIUM CHLORIDE 0.9 % IV SOLN
1.0000 g | INTRAVENOUS | Status: DC
  Administered 2019-01-24 – 2019-01-26 (×3): 1 g via INTRAVENOUS
  Filled 2019-01-24 (×3): qty 10

## 2019-01-24 MED ORDER — ACETAMINOPHEN 650 MG RE SUPP: 650.0000 mg | Freq: Four times a day (QID) | RECTAL | Status: DC | PRN

## 2019-01-24 MED ORDER — SODIUM CHLORIDE 0.9 % IV SOLN
Freq: Once | INTRAVENOUS | Status: AC
  Administered 2019-01-24: 17:00:00 via INTRAVENOUS

## 2019-01-24 MED ORDER — SODIUM CHLORIDE 0.9 % IV SOLN
INTRAVENOUS | Status: AC
  Administered 2019-01-24: 23:00:00 via INTRAVENOUS

## 2019-01-24 MED ORDER — SODIUM CHLORIDE 0.9 % IV BOLUS
1000.0000 mL | Freq: Once | INTRAVENOUS | Status: AC
  Administered 2019-01-24: 15:00:00 1000 mL via INTRAVENOUS

## 2019-01-24 MED ORDER — ACETAMINOPHEN 325 MG PO TABS: 650.0000 mg | ORAL_TABLET | Freq: Four times a day (QID) | ORAL | Status: DC | PRN

## 2019-01-24 MED ORDER — HEPARIN SODIUM (PORCINE) 5000 UNIT/ML IJ SOLN
5000.0000 [IU] | Freq: Three times a day (TID) | INTRAMUSCULAR | Status: DC
  Administered 2019-01-24 – 2019-01-27 (×8): 5000 [IU] via SUBCUTANEOUS
  Filled 2019-01-24 (×7): qty 1

## 2019-01-24 NOTE — ED Notes (Signed)
Cooling blanket at bedside.

## 2019-01-24 NOTE — ED Notes (Signed)
Additional tech's and RN at bedside to attempt to get cooling blanket to work. Unable to fix cooling blanket. Materials called to see if we have the incorrect parts.

## 2019-01-24 NOTE — ED Notes (Signed)
Ice packs applied to patient's groin, underarms and back of neck. Cool washcloth applied to patient's forehead. Unable to order large fans for patient's room for cooling purposes per secretary due to infection prevention.

## 2019-01-24 NOTE — ED Notes (Signed)
Attempted blood draws from both lines. Called phlebotomy to draw labs.

## 2019-01-24 NOTE — ED Notes (Signed)
ED TO INPATIENT HANDOFF REPORT  ED Nurse Name and Phone #: William Hamburger, [place]  S Name/Age/Gender Steven [name] 84 y.o. male Room/Bed: 031C/031C  Code Status   Code Status: Prior  Home/SNF/Other Skilled nursing facility Patient oriented to: time Is this baseline? No   Triage Complete: Triage complete  Chief Complaint aloc/heat stroke  Triage Note Pt BIB [facility] after [facility]. Per EMS upon their arrival patient was very hot to touch, altered, with garbled speech that didn't make sense. Per EMS patient began to become more oriented en route to the ED. Pt given 71cc of NSS by EMS. BP 146/65, CBG 118. Pt is alert and oriented to person and place only upon ED arrival. Denies any pain.    Allergies No Known Allergies  Level of Care/Admitting Diagnosis ED Disposition    ED Disposition Condition Comment   Admit  The patient appears reasonably stabilized for admission considering the current resources, flow, and capabilities available in the ED at this time, and I doubt any other [facility] requiring further screening and/or treatment in the ED prior to admission is  present.       B Medical/Surgery History Past Medical History:  Diagnosis Date  . Atrial flutter ([place])   . CAD (coronary artery disease)    02/24/18 PCI/DES x1 to the pLAD  . Carpal tunnel syndrome   . Cataract    left eye  . Chest pain   . CKD (chronic kidney disease), stage III ([place])   . Enlarged prostate   . History of gout   . History of kidney stones    pt has one now but not giving him any problems  . Numbness    both hands pt states pinched. 12-17-14 Gabapentin has improved.  . Pneumonia    history of pneumonia  . Psoriasis    Past Surgical History:  Procedure Laterality Date  . CATARACT EXTRACTION W/PHACO Left 03/15/2013   Procedure: CATARACT EXTRACTION PHACO AND INTRAOCULAR LENS PLACEMENT (IOC);  Surgeon: Adonis Brook, MD;  Location: [facility];  Service: Ophthalmology;  Laterality: Left;   . COLONOSCOPY    . COLONOSCOPY WITH PROPOFOL N/A 12/24/2014   Procedure: COLONOSCOPY WITH PROPOFOL;  Surgeon: Garlan Fair, MD;  Location: [facility];  Service: Endoscopy;  Laterality: N/A;  . CORONARY STENT INTERVENTION N/A 02/24/2018   Procedure: CORONARY STENT INTERVENTION;  Surgeon: Jettie Booze, MD;  Location: [facility];  Service: Cardiovascular;  Laterality: N/A;  . ELECTROPHYSIOLOGIC STUDY N/A 02/28/2016   Procedure: A-Flutter Ablation;  Surgeon: Deboraha Sprang, MD;  Location: [facility];  Service: Cardiovascular;  Laterality: N/A;  . right cataract removed     . RIGHT/LEFT HEART CATH AND CORONARY ANGIOGRAPHY N/A 02/24/2018   Procedure: RIGHT/LEFT HEART CATH AND CORONARY ANGIOGRAPHY;  Surgeon: Jettie Booze, MD;  Location: [facility];  Service: Cardiovascular;  Laterality: N/A;  . TONSILLECTOMY     as child     A IV Location/Drains/Wounds Patient Lines/Drains/Airways Status   Active Line/Drains/Airways    Name:   Placement date:   Placement time:   Site:   Days:   Peripheral IV 01/24/19   01/24/19    1454    -   less than 1          Intake/Output Last 24 hours No intake or output data in the 24 hours ending 01/24/19 2020  Labs/Imaging Results for orders placed or performed during the hospital encounter  of 01/24/19 (from the past 48 hour(s))  Comprehensive metabolic panel     Status: Abnormal   Collection Time: 01/24/19  2:43 PM  Result Value Ref Range   Sodium 137 135 - 145 mmol/L   Potassium 4.4 3.5 - 5.1 mmol/L   Chloride 103 98 - 111 mmol/L   CO2 23 22 - 32 mmol/L   Glucose, Bld 93 70 - 99 mg/dL   BUN 27 (H) 8 - 23 mg/dL   Creatinine, Ser 1.39 (H) 0.61 - 1.24 mg/dL   Calcium 9.0 8.9 - 10.3 mg/dL   Total Protein 7.3 6.5 - 8.1 g/dL   Albumin 3.8 3.5 - 5.0 g/dL   AST 28 15 - 41 U/L   ALT 19 0 - 44 U/L   Alkaline Phosphatase 58 38 - 126 U/L   Total Bilirubin 0.9 0.3 - 1.2 mg/dL   GFR calc non Af Amer 47 (L) >60 mL/min    GFR calc Af Amer 54 (L) >60 mL/min   Anion gap 11 5 - 15    Comment: Performed at [facility], [address] [place], [place] [address]  CBC with Differential     Status: Abnormal   Collection Time: 01/24/19  2:43 PM  Result Value Ref Range   WBC 5.0 4.0 - 10.5 K/uL   RBC 4.13 (L) 4.22 - 5.81 MIL/uL   Hemoglobin 12.0 (L) 13.0 - 17.0 g/dL   HCT 36.1 (L) 39.0 - 52.0 %   MCV 87.4 80.0 - 100.0 fL   MCH 29.1 26.0 - 34.0 pg   MCHC 33.2 30.0 - 36.0 g/dL   RDW 14.0 11.5 - 15.5 %   Platelets 128 (L) 150 - 400 K/uL    Comment: REPEATED TO VERIFY   nRBC 0.0 0.0 - 0.2 %   Neutrophils Relative % 89 %   Neutro Abs 4.4 1.7 - 7.7 K/uL   Lymphocytes Relative 10 %   Lymphs Abs 0.5 (L) 0.7 - 4.0 K/uL   Monocytes Relative 1 %   Monocytes Absolute 0.0 (L) 0.1 - 1.0 K/uL   Eosinophils Relative 0 %   Eosinophils Absolute 0.0 0.0 - 0.5 K/uL   Basophils Relative 0 %   Basophils Absolute 0.0 0.0 - 0.1 K/uL   Immature Granulocytes 0 %   Abs Immature Granulocytes 0.02 0.00 - 0.07 K/uL    Comment: Performed at [facility], [place] [address], [place], [place] [address]  CK     Status: None   Collection Time: 01/24/19  2:43 PM  Result Value Ref Range   Total CK 153 49 - 397 U/L    Comment: Performed at [facility], [place] [address], [place], [place] [address]  Troponin I (High Sensitivity)     Status: None   Collection Time: 01/24/19  2:43 PM  Result Value Ref Range   Troponin I (High Sensitivity) 17 <18 ng/L    Comment: (NOTE) Elevated high sensitivity troponin I (hsTnI) values and significant  changes across serial measurements may suggest ACS but many other  chronic and acute conditions are known to elevate hsTnI results.  Refer to the "Links" section for chest pain algorithms and additional  guidance. Performed at [facility], [place] [address], [place], [place] [address]   Lactic acid, plasma     Status: None   Collection Time: 01/24/19  3:09 PM  Result Value Ref Range    Lactic Acid, Venous 1.8 0.5 - 1.9 mmol/L  Comment: Performed at [facility], [place] [address] [place], [place] [address]  Protime-INR     Status: None   Collection Time: 01/24/19  3:40 PM  Result Value Ref Range   Prothrombin Time 14.8 11.4 - 15.2 seconds   INR 1.2 0.8 - 1.2    Comment: (NOTE) INR goal varies based on device and disease states. Performed at [facility], [place] [address] [place], [place] [address]   POCT I-Stat EG7     Status: Abnormal   Collection Time: 01/24/19  4:17 PM  Result Value Ref Range   pH, Ven 7.409 7.250 - 7.430   pCO2, Ven 41.5 (L) 44.0 - 60.0 mmHg   pO2, Ven 33.0 32.0 - 45.0 mmHg   Bicarbonate 26.3 20.0 - 28.0 mmol/L   TCO2 28 22 - 32 mmol/L   O2 Saturation 65.0 %   Acid-Base Excess 1.0 0.0 - 2.0 mmol/L   Sodium 140 135 - 145 mmol/L   Potassium 4.1 3.5 - 5.1 mmol/L   Calcium, Ion 1.13 (L) 1.15 - 1.40 mmol/L   HCT 34.0 (L) 39.0 - 52.0 %   Hemoglobin 11.6 (L) 13.0 - 17.0 g/dL   Patient temperature HIDE    Sample type VENOUS    Comment NOTIFIED PHYSICIAN   SARS Coronavirus 2 (CEPHEID - Performed in [place] hospital lab), Hosp Order     Status: None   Collection Time: 01/24/19  4:38 PM   Specimen: Nasopharyngeal Swab  Result Value Ref Range   SARS Coronavirus 2 NEGATIVE NEGATIVE    Comment: (NOTE) If result is NEGATIVE SARS-CoV-2 target nucleic acids are NOT DETECTED. The SARS-CoV-2 RNA is generally detectable in upper and lower  respiratory specimens during the acute phase of infection. The lowest  concentration of SARS-CoV-2 viral copies this assay can detect is 250  copies / mL. A negative result does not preclude SARS-CoV-2 infection  and should not be used as the sole basis for treatment or other  patient management decisions.  A negative result may occur with  improper specimen collection / handling, submission of specimen other  than nasopharyngeal swab, presence of viral mutation(s) within the  areas targeted by  this assay, and inadequate number of viral copies  (<250 copies / mL). A negative result must be combined with clinical  observations, patient history, and epidemiological information. If result is POSITIVE SARS-CoV-2 target nucleic acids are DETECTED. The SARS-CoV-2 RNA is generally detectable in upper and lower  respiratory specimens dur ing the acute phase of infection.  Positive  results are indicative of active infection with SARS-CoV-2.  Clinical  correlation with patient history and other diagnostic information is  necessary to determine patient infection status.  Positive results do  not rule out bacterial infection or co-infection with other viruses. If result is PRESUMPTIVE POSTIVE SARS-CoV-2 nucleic acids MAY BE PRESENT.   A presumptive positive result was obtained on the submitted specimen  and confirmed on repeat testing.  While 2019 novel coronavirus  (SARS-CoV-2) nucleic acids may be present in the submitted sample  additional confirmatory testing may be necessary for epidemiological  and / or clinical management purposes  to differentiate between  SARS-CoV-2 and other Sarbecovirus currently known to infect humans.  If clinically indicated additional testing with an alternate test  methodology [phone]) is advised. The SARS-CoV-2 RNA is generally  detectable in upper and lower respiratory sp ecimens during the acute  phase of infection. The expected result is Negative. Fact Sheet for  Patients:  StrictlyIdeas.no Fact Sheet for Healthcare Providers: BankingDealers.co.za This test is not yet approved or cleared by the [place] FDA and has been authorized for detection and/or diagnosis of SARS-CoV-2 by FDA under an Emergency Use Authorization (EUA).  This EUA will remain in effect (meaning this test can be used) for the duration of the COVID-19 declaration under Section 564(b)(1) of the Act, 21 U.S.C. section  360bbb-3(b)(1), unless the authorization is terminated or revoked sooner. Performed at [facility], [place] [address] [place], [place] [address]   Urinalysis, Routine w reflex microscopic     Status: Abnormal   Collection Time: 01/24/19  6:22 PM  Result Value Ref Range   Color, Urine YELLOW YELLOW   APPearance HAZY (A) CLEAR   Specific Gravity, Urine 1.010 1.005 - 1.030   pH 5.0 5.0 - 8.0   Glucose, UA NEGATIVE NEGATIVE mg/dL   Hgb urine dipstick MODERATE (A) NEGATIVE   Bilirubin Urine NEGATIVE NEGATIVE   Ketones, ur NEGATIVE NEGATIVE mg/dL   Protein, ur NEGATIVE NEGATIVE mg/dL   Nitrite POSITIVE (A) NEGATIVE   Leukocytes,Ua LARGE (A) NEGATIVE   RBC / HPF 0-5 0 - 5 RBC/hpf   WBC, UA 6-10 0 - 5 WBC/hpf   Bacteria, UA MANY (A) NONE SEEN   Squamous Epithelial / LPF 0-5 0 - 5   Mucus PRESENT     Comment: Performed at [facility], [address] [place], [place] [address]  Troponin I (High Sensitivity)     Status: Abnormal   Collection Time: 01/24/19  6:24 PM  Result Value Ref Range   Troponin I (High Sensitivity) 35 (H) <18 ng/L    Comment: (NOTE) Elevated high sensitivity troponin I (hsTnI) values and significant  changes across serial measurements may suggest ACS but many other  chronic and acute conditions are known to elevate hsTnI results.  Refer to the "Links" section for chest pain algorithms and additional  guidance. Performed at [facility], [place] [address], [place], [place] [address]   APTT     Status: None   Collection Time: 01/24/19  7:14 PM  Result Value Ref Range   aPTT 27 24 - 36 seconds    Comment: Performed at [facility] [address] [place], [place] [address]   Ct Head Wo Contrast  Result Date: 01/24/2019 CLINICAL DATA:  Altered level of consciousness. EXAM: CT HEAD WITHOUT CONTRAST TECHNIQUE: Contiguous axial images were obtained from the base of the skull through the vertex without intravenous contrast. COMPARISON:  MR brain  December 13, 2015 FINDINGS: Brain: No evidence of acute infarction, hemorrhage, hydrocephalus, extra-axial collection or mass lesion/mass effect. There is chronic diffuse atrophy. Chronic bilateral periventricular white matter small vessel ischemic changes are noted. Vascular: No hyperdense vessel is identified. Skull: Normal. Negative for fracture or focal lesion. Sinuses/Orbits: No acute finding. Other: None. IMPRESSION: No focal acute intracranial abnormality identified. Electronically Signed   By: Abelardo Diesel M.D.   On: 01/24/2019 18:55   Dg Chest Port 1 View  Result Date: 01/24/2019 CLINICAL DATA:  Heat related illness EXAM: PORTABLE CHEST 1 VIEW COMPARISON:  April 01, 2018 FINDINGS: The lungs are clear without focal consolidation, edema, effusion or pneumothorax. Cardiomediastinal silhouette is within normal limits for size. Aortic knob calcifications are seen. No acute osseous abnormality. IMPRESSION: No acute cardiopulmonary process. Electronically Signed   By: Prudencio Pair M.D.   On: 01/24/2019 15:38    Pending Labs Unresulted Labs (From admission,  onward)    Start     Ordered   01/24/19 2011  Urine culture  Add-on,   AD     01/24/19 2010   01/24/19 1914  Blood Culture (routine x 2)  BLOOD CULTURE X 2,   STAT     01/24/19 1917   01/24/19 1448  Lactic acid, plasma  Now then every 2 hours,   STAT     01/24/19 1447          Vitals/Pain Today's Vitals   01/24/19 1900 01/24/19 1915 01/24/19 1930 01/24/19 1959  BP: (!) 145/90 (!) 145/65 (!) 144/61   Pulse:      Resp: (!) 30 (!) 28 (!) 28   Temp:    98.6 F (37 C)  TempSrc:    Oral  SpO2:        Isolation Precautions No active isolations  Medications Medications  cefTRIAXone (ROCEPHIN) 1 g in sodium chloride 0.9 % 100 mL IVPB (1 g Intravenous New Bag/Given 01/24/19 2002)  sodium chloride 0.9 % bolus 1,000 mL (1,000 mLs Intravenous New Bag/Given 01/24/19 1455)  0.9 %  sodium chloride infusion ( Intravenous New Bag/Given 01/24/19  1634)  acetaminophen (TYLENOL) tablet 650 mg (650 mg Oral Given 01/24/19 1845)    Mobility walks with person assist     Focused Assessments Pulmonary Assessment Handoff:  Lung sounds:   O2 Device: Room Air        R Recommendations: See Admitting Provider Note  Report given to:   Additional Notes: Sepsis w/u; WBC WNL; BC x2 + abx given

## 2019-01-24 NOTE — ED Provider Notes (Signed)
Steven [name] is a 84 y.o. male, presenting to the ED with altered mental status.  HPI from Irena Cords, PA-C: "Patient presents to the emergency department with altered mental status.  The patient has been working outside in the extreme heat since around 5 AM.  The patient became altered and EMS was called.  Patient is not able to give any history.  Patient will open eyes and answer some questions but the history does not appear to be accurate.  Patient was found to be hypothermic"  Past Medical History:  Diagnosis Date  . Atrial flutter ([place])   . CAD (coronary artery disease)    02/24/18 PCI/DES x1 to the pLAD  . Carpal tunnel syndrome   . Cataract    left eye  . Chest pain   . CKD (chronic kidney disease), stage III ([place])   . Enlarged prostate   . History of gout   . History of kidney stones    pt has one now but not giving him any problems  . Numbness    both hands pt states pinched. 12-17-14 Gabapentin has improved.  . Pneumonia    history of pneumonia  . Psoriasis      Physical Exam  BP (!) 172/63   Pulse 94   Temp (!) 105 F (40.6 C) (Rectal)   Resp (!) 37   SpO2 97%   Physical Exam Vitals signs and nursing note reviewed.  Constitutional:      General: He is not in acute distress.    Appearance: He is well-developed. He is not diaphoretic.  HENT:     Head: Normocephalic and atraumatic.     Mouth/Throat:     Mouth: Mucous membranes are moist.     Pharynx: Oropharynx is clear.  Eyes:     Conjunctiva/sclera: Conjunctivae normal.  Neck:     Musculoskeletal: Neck supple.  Cardiovascular:     Rate and Rhythm: Normal rate and regular rhythm.     Pulses: Normal pulses.          Radial pulses are 2+ on the right side and 2+ on the left side.       Posterior tibial pulses are 2+ on the right side and 2+ on the left side.     Heart sounds: Normal heart sounds.     Comments: Tactile temperature in the extremities appropriate and equal bilaterally. Pulmonary:   Effort: Pulmonary effort is normal. No respiratory distress.     Breath sounds: Normal breath sounds.  Abdominal:     Palpations: Abdomen is soft.     Tenderness: There is no abdominal tenderness. There is no guarding.  Musculoskeletal:     Right lower leg: No edema.     Left lower leg: No edema.  Lymphadenopathy:     Cervical: No cervical adenopathy.  Skin:    General: Skin is warm and dry.  Neurological:     Mental Status: He is alert.     Comments: Oriented to self and is able to communicate that he is in the hospital.  He correctly names the president.  Correctly states month and year.  Psychiatric:        Mood and Affect: Mood and affect normal.        Speech: Speech normal.        Behavior: Behavior normal.     ED Course/Procedures    Procedures   Abnormal Labs Reviewed  URINALYSIS, ROUTINE W REFLEX MICROSCOPIC - Abnormal; Notable for the following  components:      Result Value   APPearance HAZY (*)    Hgb urine dipstick MODERATE (*)    Nitrite POSITIVE (*)    Leukocytes,Ua LARGE (*)    Bacteria, UA MANY (*)    All other components within normal limits  COMPREHENSIVE METABOLIC PANEL - Abnormal; Notable for the following components:   BUN 27 (*)    Creatinine, Ser 1.39 (*)    GFR calc non Af Amer 47 (*)    GFR calc Af Amer 54 (*)    All other components within normal limits  CBC WITH DIFFERENTIAL/PLATELET - Abnormal; Notable for the following components:   RBC 4.13 (*)    Hemoglobin 12.0 (*)    HCT 36.1 (*)    Platelets 128 (*)    Lymphs Abs 0.5 (*)    Monocytes Absolute 0.0 (*)    All other components within normal limits  POCT I-STAT EG7 - Abnormal; Notable for the following components:   pCO2, Ven 41.5 (*)    Calcium, Ion 1.13 (*)    HCT 34.0 (*)    Hemoglobin 11.6 (*)    All other components within normal limits  TROPONIN I (HIGH SENSITIVITY) - Abnormal; Notable for the following components:   Troponin I (High Sensitivity) 35 (*)    All other  components within normal limits   Hemoglobin  Date Value Ref Range Status  01/24/2019 11.6 (L) 13.0 - 17.0 g/dL Final  01/24/2019 12.0 (L) 13.0 - 17.0 g/dL Final  11/25/2018 11.6 (L) 13.0 - 17.0 g/dL Final  02/24/2018 11.4 (L) 13.0 - 17.0 g/dL Final  01/10/2018 12.5 (L) 13.0 - 17.0 g/dL Final   HGB  Date Value Ref Range Status  04/08/2015 13.1 13.0 - 17.1 g/dL Final  03/11/2015 12.6 (L) 13.0 - 17.1 g/dL Final  02/04/2015 12.3 (L) 13.0 - 17.1 g/dL Final   BUN  Date Value Ref Range Status  01/24/2019 27 (H) 8 - 23 mg/dL Final  11/25/2018 17 8 - 23 mg/dL Final  04/27/2018 16 8 - 27 mg/dL Final  02/24/2018 13 8 - 23 mg/dL Final  01/10/2018 18 8 - 23 mg/dL Final    Comment:    Please note change in reference range.  02/04/2015 18.7 7.0 - 26.0 mg/dL Final   Creatinine  Date Value Ref Range Status  11/25/2018 1.23 0.61 - 1.24 mg/dL Final  02/04/2015 1.1 0.7 - 1.3 mg/dL Final   Creat  Date Value Ref Range Status  02/26/2016 1.05 0.70 - 1.18 mg/dL Final    Comment:      For patients > or = 82 years of age: The upper reference limit for Creatinine is approximately 13% higher for people identified as African-American.      Creatinine, Ser  Date Value Ref Range Status  01/24/2019 1.39 (H) 0.61 - 1.24 mg/dL Final  04/27/2018 1.05 0.76 - 1.27 mg/dL Final  02/24/2018 1.17 0.61 - 1.24 mg/dL Final  01/10/2018 1.28 (H) 0.61 - 1.24 mg/dL Final     Ct Head Wo Contrast  Result Date: 01/24/2019 CLINICAL DATA:  Altered level of consciousness. EXAM: CT HEAD WITHOUT CONTRAST TECHNIQUE: Contiguous axial images were obtained from the base of the skull through the vertex without intravenous contrast. COMPARISON:  MR brain December 13, 2015 FINDINGS: Brain: No evidence of acute infarction, hemorrhage, hydrocephalus, extra-axial collection or mass lesion/mass effect. There is chronic diffuse atrophy. Chronic bilateral periventricular white matter small vessel ischemic changes are noted. Vascular:  No hyperdense vessel is  identified. Skull: Normal. Negative for fracture or focal lesion. Sinuses/Orbits: No acute finding. Other: None. IMPRESSION: No focal acute intracranial abnormality identified. Electronically Signed   By: Abelardo Diesel M.D.   On: 01/24/2019 18:55   Dg Chest Port 1 View  Result Date: 01/24/2019 CLINICAL DATA:  Heat related illness EXAM: PORTABLE CHEST 1 VIEW COMPARISON:  April 01, 2018 FINDINGS: The lungs are clear without focal consolidation, edema, effusion or pneumothorax. Cardiomediastinal silhouette is within normal limits for size. Aortic knob calcifications are seen. No acute osseous abnormality. IMPRESSION: No acute cardiopulmonary process. Electronically Signed   By: Prudencio Pair M.D.   On: 01/24/2019 15:38     EKG Interpretation  Date/Time:  Tuesday January 24 2019 14:22:57 EDT Ventricular Rate:  89 PR Interval:    QRS Duration: 94 QT Interval:  337 QTC Calculation: 410 R Axis:   -14 Text Interpretation:  Normal sinus rhythm r rprime v1 and v2- partial rbbb Confirmed by Pattricia Boss [id number] on 01/24/2019 7:16:06 PM       MDM   Clinical Course as of Jan 24 2128  Tue Jan 24, 2019  1616 Spoke with patient. Oriented to self. Knows he is in the hospital, but does not know why. He knows it's July 2020.  He suddenly told me he needed to urinate and then started just urinating. States he has been urinating more frequently and pain with urination for the last 2 weeks.    [SJ]  1939 Spoke with patient's son, Eragon [place]. [phone]), and updated him on labs and imaging results as well as the plan to admit the patient.   [SJ]  2023 Spoke with Dr. Marlowe Sax, hospitalist. Agrees to admit the patient.    [SJ]    Clinical Course User Index [SJ] Lorayne Bender, PA-C    Patient care handoff report received from [place], PA-C. Plan: Patient has continued work-up pending. Top working diagnosis was hyperthermia, suspected heatstroke due to history elements  communicated to treatment team upon patient's presentation.  4:19 PM Nurse is telling me they have not gotten the cooling blanket to work. I ordered them to bring in cold packs.  Patient arrived febrile and confused. Patient is nontoxic appearing, not tachycardic, not tachypneic, not hypotensive, maintains excellent SPO2 on room air, and is in no apparent distress.  As patient's temperature improved, so did his mental status and orientation. No leukocytosis and no significant elevation in WBC count even when compared with previous leukopenia. No lactic acidosis. Mild thrombocytopenia. Elevation in creatinine and BUN addressed with IV fluids. When UA returned with evidence of infection, code sepsis initiated. Patient admitted for further management.  Findings and plan of care discussed with Pattricia Boss, MD.   Vitals:   01/24/19 1434 01/24/19 1445 01/24/19 1500 01/24/19 1515  BP:  (!) 149/79 (!) 165/64 (!) 172/63  Pulse:  98 99 94  Resp:  (!) 35 (!) 42 (!) 37  Temp: (!) 105 F (40.6 C)     TempSrc: Rectal     SpO2:  95% 100% 97%   Vitals:   01/24/19 1900 01/24/19 1915 01/24/19 1930 01/24/19 1959  BP: (!) 145/90 (!) 145/65 (!) 144/61   Pulse:      Resp: (!) 30 (!) 28 (!) 28   Temp:    98.6 F (37 C)  TempSrc:    Oral  SpO2:          Layla Maw 01/24/19 2139    Pattricia Boss, MD 01/26/19 [phone]

## 2019-01-24 NOTE — ED Provider Notes (Signed)
[facility] EMERGENCY DEPARTMENT Provider Note   CSN: [id number] Arrival date & time: 01/24/19  1421     History   Chief Complaint Chief Complaint  Patient presents with  . Altered Mental Status    HPI Steven [name] is a 84 y.o. male.     HPI Patient presents to the emergency department with altered mental status.  The patient has been working outside in the extreme heat since around 5 AM.  The patient became altered and EMS was called.  Patient is not able to give any history.  Patient will open eyes and answer some questions but the history does not appear to be accurate.  Patient was found to be hypothermic Past Medical History:  Diagnosis Date  . Atrial flutter ([place])   . CAD (coronary artery disease)    02/24/18 PCI/DES x1 to the pLAD  . Carpal tunnel syndrome   . Cataract    left eye  . Chest pain   . CKD (chronic kidney disease), stage III ([place])   . Enlarged prostate   . History of gout   . History of kidney stones    pt has one now but not giving him any problems  . Numbness    both hands pt states pinched. 12-17-14 Gabapentin has improved.  . Pneumonia    history of pneumonia  . Psoriasis     Patient Active Problem List   Diagnosis Date Noted  . CAD (coronary artery disease), native coronary artery 04/11/2018  . Hypertensive heart disease without CHF 04/11/2018  . Hyperlipidemia 04/11/2018  . Obesity (BMI 30-39.9) 04/11/2018  . Nonrheumatic aortic valve stenosis   . CKD (chronic kidney disease), stage III ([place])   . Atrial flutter ([place]) 02/28/2016  . Leukocytopenia 03/11/2015  . Deficiency anemia 02/04/2015    Past Surgical History:  Procedure Laterality Date  . CATARACT EXTRACTION W/PHACO Left 03/15/2013   Procedure: CATARACT EXTRACTION PHACO AND INTRAOCULAR LENS PLACEMENT (IOC);  Surgeon: Adonis Brook, MD;  Location: [facility];  Service: Ophthalmology;  Laterality: Left;  . COLONOSCOPY    . COLONOSCOPY WITH PROPOFOL N/A 12/24/2014   Procedure: COLONOSCOPY WITH PROPOFOL;  Surgeon: Garlan Fair, MD;  [facility];  Service: Endoscopy;  Laterality: N/A;  . CORONARY STENT INTERVENTION N/A 02/24/2018   Procedure: CORONARY STENT INTERVENTION;  Surgeon: Jettie Booze, MD;  Location: [facility];  Service: Cardiovascular;  Laterality: N/A;  . ELECTROPHYSIOLOGIC STUDY N/A 02/28/2016   Procedure: A-Flutter Ablation;  Surgeon: Deboraha Sprang, MD;  Location: [facility];  Service: Cardiovascular;  Laterality: N/A;  . right cataract removed     . RIGHT/LEFT HEART CATH AND CORONARY ANGIOGRAPHY N/A 02/24/2018   Procedure: RIGHT/LEFT HEART CATH AND CORONARY ANGIOGRAPHY;  Surgeon: Jettie Booze, MD;  Location: [facility];  Service: Cardiovascular;  Laterality: N/A;  . TONSILLECTOMY     as child        Home Medications    Prior to Admission medications   Medication Sig Start Date End Date Taking? Authorizing Provider  aspirin EC 81 MG tablet Take 1 tablet (81 mg total) by mouth daily. 02/24/18   Jettie Booze, MD  atorvastatin (LIPITOR) 40 MG tablet Take 40 mg by mouth daily. 02/11/18   [provider]  clopidogrel (PLAVIX) 75 MG tablet TAKE 1 TABLET BY MOUTH EVERY DAY 10/24/18   Richardo Priest, MD  clotrimazole-betamethasone (LOTRISONE) cream Apply topically 2 (two) times daily. 11/07/18  [provider]  Cyanocobalamin (VITAMIN B-12 PO) Take 1 tablet by mouth daily.    [provider]  metoprolol succinate (TOPROL-XL) 50 MG 24 hr tablet Take 50 mg by mouth daily. 10/28/18   [provider]  nitroGLYCERIN (NITROSTAT) 0.4 MG SL tablet Place 1 tablet (0.4 mg total) under the tongue every 5 (five) minutes as needed. Patient not taking: Reported on 11/25/2018 02/24/18   Cheryln Manly, NP  Omega-3 Fatty Acids (FISH OIL) 1000 MG CAPS Take 1,000 mg by mouth daily.    [provider]    Family History Family History  Problem Relation Age of  Onset  . Other Mother        Psychologist, forensic  . Healthy Father   . Kidney failure Sister   . Kidney failure Sister   . Diabetes Son   . Heart attack Neg Hx     Social History Social History   Tobacco Use  . Smoking status: Former Smoker    Types: Cigars  . Smokeless tobacco: Former Systems developer    Types: Chew  . Tobacco comment: smoked cigars 5yrs ago  Substance Use Topics  . Alcohol use: No  . Drug use: No     Allergies   Patient has no known allergies.   Review of Systems Review of Systems Level 5 caveat applies due to altered mental status  Physical Exam Updated Vital Signs BP (!) 165/64   Pulse 99   Temp (!) 105 F (40.6 C) (Rectal)   Resp (!) 42   SpO2 100%   Physical Exam Vitals signs and nursing note reviewed.  Constitutional:      General: He is not in acute distress.    Appearance: He is well-developed. He is ill-appearing.  HENT:     Head: Normocephalic and atraumatic.  Eyes:     Pupils: Pupils are equal, round, and reactive to light.  Neck:     Musculoskeletal: Normal range of motion and neck supple.  Cardiovascular:     Rate and Rhythm: Normal rate and regular rhythm.     Heart sounds: Normal heart sounds. No murmur. No friction rub. No gallop.   Pulmonary:     Effort: Pulmonary effort is normal. No respiratory distress.     Breath sounds: Normal breath sounds. No wheezing.  Abdominal:     General: Bowel sounds are normal. There is no distension.     Palpations: Abdomen is soft.     Tenderness: There is no abdominal tenderness.  Skin:    General: Skin is warm and dry.     Capillary Refill: Capillary refill takes less than 2 seconds.     Findings: No erythema or rash.  Neurological:     Mental Status: He is easily aroused. He is lethargic and disoriented.  Psychiatric:        Behavior: Behavior normal.      ED Treatments / Results  Labs (all labs ordered are listed, but only abnormal results are displayed) Labs Reviewed  URINALYSIS,  ROUTINE W REFLEX MICROSCOPIC  COMPREHENSIVE METABOLIC PANEL  CBC WITH DIFFERENTIAL/PLATELET  CK  LACTIC ACID, PLASMA  LACTIC ACID, PLASMA  PROTIME-INR  I-STAT VENOUS BLOOD GAS, ED  TROPONIN I (HIGH SENSITIVITY)    EKG None  Radiology No results found.  Procedures Procedures (including critical care time)  Medications Ordered in ED Medications  sodium chloride 0.9 % bolus 1,000 mL (1,000 mLs Intravenous New Bag/Given 01/24/19 1455)     Initial Impression / Assessment  and Plan / ED Course  I have reviewed the triage vital signs and the nursing notes.  Pertinent labs & imaging results that were available during my care of the patient were reviewed by me and considered in my medical decision making (see chart for details).       Patient will be evaluated with laboratory testing placed on the cooling blanket and IV fluids.  Patient is hypothermic and this is concerning due to his age.  Patient most likely will need admission to the hospital.  Final Clinical Impressions(s) / ED Diagnoses   Final diagnoses:  None    ED Discharge Orders    None       Dalia Heading, PA-C 01/24/19 1513    Veryl Speak, MD 01/31/19 [phone]

## 2019-01-24 NOTE — ED Notes (Signed)
Cooling blanket ordered from portable equipment.

## 2019-01-24 NOTE — ED Notes (Signed)
Cooling blanket not working at this time. 2 RN's at bedside.

## 2019-01-24 NOTE — ED Notes (Signed)
Called lab to add on UDS. 

## 2019-01-24 NOTE — ED Notes (Signed)
Pt critical labs results given to Dr. Jeanell Sparrow

## 2019-01-24 NOTE — H&P (Signed)
History and Physical    Steven [name] RKY:[id number] DOB: 02-22-1935 DOA: 01/24/2019  PCP: Hulan Fess, MD  Chief Complaint: Altered mental status  HPI: Steven [name] is a 84 y.o. male with medical history significant of atrial flutter, CAD status post PCI, CKD stage III, enlarged prostate, gout presenting via EMS for evaluation of altered mental status.  Per EMS report, he had been working outside in extreme heat since around 5 AM.  He became altered and EMS was called.  Patient is currently awake and alert.  He knows his name, knows he is at [facility], and knows it is July 2020.  He is not sure what exactly happened.  He recalls driving to a drugstore today.  States his windows were down when he was driving.  He does not remember if he was outdoor for a long time.  Reports having dysuria and urinary frequency/urgency.  Denies any headaches, chest pain, shortness of breath, cough, nausea, vomiting, or abdominal pain.  No other complaints.  ED Course: T-max 105 F.  Tachypneic.  Not tachycardic or hypotensive.  Not hypoxic.  No leukocytosis.  Lactic acid normal.  Platelet count 128,000, previously normal.  BUN 27.  Creatinine 1.3, baseline 1.0-1.2.  LFTs normal.  Bicarb 23, anion gap 11.  CK normal.  High-sensitivity troponin 17 >35.  PT 14.8, INR 1.2, APTT 27.  VBG with pH 7.40.  COVID-19 rapid test negative.  UA with positive nitrite, large amount of leukocytes, 6-10 WBCs, and many bacteria.  Urine culture pending.  Chest x-ray showing no acute cardiopulmonary process.  Head CT negative for acute intracranial abnormality. Blood culture x2 pending. There were problems getting the cooling blanket to work in the ED and cold packs were applied.  Patient received Tylenol, ceftriaxone, and 1 L fluid bolus in the ED. Temperature now improved to 98.6 F.  Review of Systems:  All systems reviewed and apart from history of presenting illness, are negative.  Past Medical History:  Diagnosis  Date  . Atrial flutter ([place])   . CAD (coronary artery disease)    02/24/18 PCI/DES x1 to the pLAD  . Carpal tunnel syndrome   . Cataract    left eye  . Chest pain   . CKD (chronic kidney disease), stage III ([place])   . Enlarged prostate   . History of gout   . History of kidney stones    pt has one now but not giving him any problems  . Numbness    both hands pt states pinched. 12-17-14 Gabapentin has improved.  . Pneumonia    history of pneumonia  . Psoriasis     Past Surgical History:  Procedure Laterality Date  . CATARACT EXTRACTION W/PHACO Left 03/15/2013   Procedure: CATARACT EXTRACTION PHACO AND INTRAOCULAR LENS PLACEMENT (IOC);  Surgeon: Adonis Brook, MD;  Location: [facility];  Service: Ophthalmology;  Laterality: Left;  . COLONOSCOPY    . COLONOSCOPY WITH PROPOFOL N/A 12/24/2014   Procedure: COLONOSCOPY WITH PROPOFOL;  Surgeon: Garlan Fair, MD;  [facility];  Service: Endoscopy;  Laterality: N/A;  . CORONARY STENT INTERVENTION N/A 02/24/2018   Procedure: CORONARY STENT INTERVENTION;  Surgeon: Jettie Booze, MD;  Location: [facility];  Service: Cardiovascular;  Laterality: N/A;  . ELECTROPHYSIOLOGIC STUDY N/A 02/28/2016   Procedure: A-Flutter Ablation;  Surgeon: Deboraha Sprang, MD;  Location: [facility];  Service: Cardiovascular;  Laterality: N/A;  . right cataract removed     .  RIGHT/LEFT HEART CATH AND CORONARY ANGIOGRAPHY N/A 02/24/2018   Procedure: RIGHT/LEFT HEART CATH AND CORONARY ANGIOGRAPHY;  Surgeon: Jettie Booze, MD;  Location: [facility];  Service: Cardiovascular;  Laterality: N/A;  . TONSILLECTOMY     as child     reports that he has quit smoking. His smoking use included cigars. He has quit using smokeless tobacco.  His smokeless tobacco use included chew. He reports that he does not drink alcohol or use drugs.  No Known Allergies  Family History  Problem Relation Age of Onset  . Other Mother        Psychologist, forensic   . Healthy Father   . Kidney failure Sister   . Kidney failure Sister   . Diabetes Son   . Heart attack Neg Hx     Prior to Admission medications   Medication Sig Start Date End Date Taking? Authorizing Provider  aspirin EC 81 MG tablet Take 1 tablet (81 mg total) by mouth daily. 02/24/18   Jettie Booze, MD  atorvastatin (LIPITOR) 40 MG tablet Take 40 mg by mouth daily. 02/11/18   [provider]  clopidogrel (PLAVIX) 75 MG tablet TAKE 1 TABLET BY MOUTH EVERY DAY 10/24/18   Richardo Priest, MD  clotrimazole-betamethasone (LOTRISONE) cream Apply topically 2 (two) times daily. 11/07/18   [provider]  Cyanocobalamin (VITAMIN B-12 PO) Take 1 tablet by mouth daily.    [provider]  metoprolol succinate (TOPROL-XL) 50 MG 24 hr tablet Take 50 mg by mouth daily. 10/28/18   [provider]  nitroGLYCERIN (NITROSTAT) 0.4 MG SL tablet Place 1 tablet (0.4 mg total) under the tongue every 5 (five) minutes as needed. Patient not taking: Reported on 11/25/2018 02/24/18   Cheryln Manly, NP  Omega-3 Fatty Acids (FISH OIL) 1000 MG CAPS Take 1,000 mg by mouth daily.    [provider]    Physical Exam: Vitals:   01/24/19 1900 01/24/19 1915 01/24/19 1930 01/24/19 1959  BP: (!) 145/90 (!) 145/65 (!) 144/61   Pulse:      Resp: (!) 30 (!) 28 (!) 28   Temp:    98.6 F (37 C)  TempSrc:    Oral  SpO2:        Physical Exam  Constitutional: He is oriented to person, place, and time. He appears well-developed and well-nourished. No distress.  HENT:  Head: Normocephalic.  Dry mucous membranes  Eyes: EOM are normal. Right eye exhibits no discharge. Left eye exhibits no discharge.  Neck: Neck supple.  Cardiovascular: Normal rate, regular rhythm and intact distal pulses.  Murmur heard. Pulmonary/Chest: Effort normal and breath sounds normal. No respiratory distress. He has no wheezes. He has no rales.  Abdominal: Soft. Bowel sounds are normal. He  exhibits no distension. There is no abdominal tenderness. There is no guarding.  Musculoskeletal:     Comments: Trace bilateral lower extremity pitting edema  Neurological: He is alert and oriented to person, place, and time.  Awake and alert He knows his name, knows he is at [facility], and knows it is July 2020. Following commands appropriately Moving all extremities spontaneously  Skin: Skin is warm and dry. He is not diaphoretic.     Labs on Admission: I have personally reviewed following labs and imaging studies  CBC: Recent Labs  Lab 01/24/19 1443 01/24/19 1617  WBC 5.0  --   NEUTROABS 4.4  --   HGB 12.0* 11.6*  HCT 36.1* 34.0*  MCV 87.4  --   PLT 128*  --    Basic Metabolic Panel: Recent Labs  Lab 01/24/19 1443 01/24/19 1617  NA 137 140  K 4.4 4.1  CL 103  --   CO2 23  --   GLUCOSE 93  --   BUN 27*  --   CREATININE 1.39*  --   CALCIUM 9.0  --    GFR: CrCl cannot be calculated (Unknown ideal weight.). Liver Function Tests: Recent Labs  Lab 01/24/19 1443  AST 28  ALT 19  ALKPHOS 58  BILITOT 0.9  PROT 7.3  ALBUMIN 3.8   No results for input(s): LIPASE, AMYLASE in the last 168 hours. No results for input(s): AMMONIA in the last 168 hours. Coagulation Profile: Recent Labs  Lab 01/24/19 1540  INR 1.2   Cardiac Enzymes: Recent Labs  Lab 01/24/19 1443  CKTOTAL 153   BNP (last 3 results) No results for input(s): PROBNP in the last 8760 hours. HbA1C: No results for input(s): HGBA1C in the last 72 hours. CBG: No results for input(s): GLUCAP in the last 168 hours. Lipid Profile: No results for input(s): CHOL, HDL, LDLCALC, TRIG, CHOLHDL, LDLDIRECT in the last 72 hours. Thyroid Function Tests: No results for input(s): TSH, T4TOTAL, FREET4, T3FREE, THYROIDAB in the last 72 hours. Anemia Panel: No results for input(s): VITAMINB12, FOLATE, FERRITIN, TIBC, IRON, RETICCTPCT in the last 72 hours. Urine analysis:    Component Value  Date/Time   COLORURINE YELLOW 01/24/2019 1822   APPEARANCEUR HAZY (A) 01/24/2019 1822   LABSPEC 1.010 01/24/2019 1822   PHURINE 5.0 01/24/2019 1822   GLUCOSEU NEGATIVE 01/24/2019 1822   HGBUR MODERATE (A) 01/24/2019 1822   BILIRUBINUR NEGATIVE 01/24/2019 1822   KETONESUR NEGATIVE 01/24/2019 1822   PROTEINUR NEGATIVE 01/24/2019 1822   UROBILINOGEN 1.0 11/24/2013 2219   NITRITE POSITIVE (A) 01/24/2019 1822   LEUKOCYTESUR LARGE (A) 01/24/2019 1822    Radiological Exams on Admission: Ct Head Wo Contrast  Result Date: 01/24/2019 CLINICAL DATA:  Altered level of consciousness. EXAM: CT HEAD WITHOUT CONTRAST TECHNIQUE: Contiguous axial images were obtained from the base of the skull through the vertex without intravenous contrast. COMPARISON:  MR brain December 13, 2015 FINDINGS: Brain: No evidence of acute infarction, hemorrhage, hydrocephalus, extra-axial collection or mass lesion/mass effect. There is chronic diffuse atrophy. Chronic bilateral periventricular white matter small vessel ischemic changes are noted. Vascular: No hyperdense vessel is identified. Skull: Normal. Negative for fracture or focal lesion. Sinuses/Orbits: No acute finding. Other: None. IMPRESSION: No focal acute intracranial abnormality identified. Electronically Signed   By: Abelardo Diesel M.D.   On: 01/24/2019 18:55   Dg Chest Port 1 View  Result Date: 01/24/2019 CLINICAL DATA:  Heat related illness EXAM: PORTABLE CHEST 1 VIEW COMPARISON:  April 01, 2018 FINDINGS: The lungs are clear without focal consolidation, edema, effusion or pneumothorax. Cardiomediastinal silhouette is within normal limits for size. Aortic knob calcifications are seen. No acute osseous abnormality. IMPRESSION: No acute cardiopulmonary process. Electronically Signed   By: Prudencio Pair M.D.   On: 01/24/2019 15:38    EKG: Independently reviewed.  Sinus rhythm, partial RBBB.  Assessment/Plan Principal Problem:   UTI (urinary tract infection) Active  Problems:   Sepsis ([place])   Hyperthermia associated with heat   Acute metabolic encephalopathy   Demand ischemia ([place])   Sepsis secondary to UTI T-max 105 F.  Tachypneic.  Not tachycardic or hypotensive.  No leukocytosis and lactic acid normal. UA with positive nitrite, large amount of leukocytes,  6-10 WBCs, and many bacteria.  Temperature now improved to 98.6 F after external cooling. -IV fluid -Ceftriaxone -Urine culture pending -Blood culture x2 pending  Severe hyperthermia Likely secondary to heatstroke and UTI also likely contributing.  T-max 105 F.  Temperature now improved to 98.6 F after external cooling. -IV fluid -Management of UTI as mentioned above -Monitor temperature closely, external cooling as needed -Cardiac monitoring -UDS, blood ethanol level, lithium level, salicylate level -Tylenol PRN  Acute metabolic encephalopathy Likely secondary to UTI and severe hyperthermia.  Head CT negative for acute intracranial abnormality. -Hypothermia has now resolved -Continue management of UTI as mentioned above  Mild thrombocytopenia Likely secondary to sepsis/ severe hyperthermia. Platelet count 128,000, previously normal.   -Continue to monitor platelet count  Demand ischemia from sepsis Patient denies chest pain. High-sensitivity troponin 17 >35.  EKG not suggestive of ACS. -Cardiac monitoring -Trend troponin  CKD stage III Creatinine 1.3, baseline 1.0-1.2.  -IV fluid hydration -Continue to monitor renal function  Unable to safely order home medications at this time as pharmacy medication reconciliation is pending.  DVT prophylaxis: Subcutaneous heparin Code Status: Patient wishes to be full code. Family Communication: No family available at this time. Disposition Plan: Anticipate discharge after clinical improvement. Consults called: None Admission status: It is my clinical opinion that admission to INPATIENT is reasonable and necessary in this 84 y.o. male  . presenting with severe hyperthermia, concerning for heatstroke and sepsis secondary to UTI . in the context of PMH including: We will need IV fluid hydration and IV antibiotic.  Close monitoring of body temperature and external cooling as needed.  Given the aforementioned, the predictability of an adverse outcome is felt to be significant. I expect that the patient will require at least 2 midnights in the hospital to treat this condition.   The medical decision making on this patient was of high complexity and the patient is at high risk for clinical deterioration, therefore this is a level 3 visit.  Shela Leff MD [facility] Pager [phone]  If 7PM-7AM, please contact night-coverage [email or web address] Password [facility]  01/24/2019, 8:47 PM

## 2019-01-24 NOTE — ED Notes (Signed)
Comptroller member at bedside. Cooling blanket and machine still not connecting and able to function. Portable equipment staff reports they will try to find another machine and bring it up.

## 2019-01-24 NOTE — ED Notes (Signed)
Materials at bedside with new cooling blanket. This blanket does not connect to cooling machine either. Instructed to call portable equipment by materials staff.

## 2019-01-24 NOTE — ED Notes (Signed)
Additional cooling blanket machine left at patient's bedside by portable equipment. This machine does not attach to cooling blanket either.

## 2019-01-24 NOTE — ED Triage Notes (Signed)
Pt BIB [facility] after being found outside of [facility]. Per EMS upon their arrival patient was very hot to touch, altered, with garbled speech that didn't make sense. Per EMS patient began to become more oriented en route to the ED. Pt given 65cc of NSS by EMS. BP 146/65, CBG 118. Pt is alert and oriented to person and place only upon ED arrival. Denies any pain.

## 2019-01-25 ENCOUNTER — Encounter (HOSPITAL_COMMUNITY): Payer: Self-pay

## 2019-01-25 DIAGNOSIS — T6701XA Heatstroke and sunstroke, initial encounter: Secondary | ICD-10-CM

## 2019-01-25 DIAGNOSIS — G9341 Metabolic encephalopathy: Secondary | ICD-10-CM

## 2019-01-25 DIAGNOSIS — A419 Sepsis, unspecified organism: Principal | ICD-10-CM

## 2019-01-25 LAB — COMPREHENSIVE METABOLIC PANEL
ALT: 22 U/L (ref 0–44)
AST: 34 U/L (ref 15–41)
Albumin: 3.1 g/dL — ABNORMAL LOW (ref 3.5–5.0)
Alkaline Phosphatase: 54 U/L (ref 38–126)
Anion gap: 9 (ref 5–15)
BUN: 21 mg/dL (ref 8–23)
CO2: 23 mmol/L (ref 22–32)
Calcium: 8.3 mg/dL — ABNORMAL LOW (ref 8.9–10.3)
Chloride: 109 mmol/L (ref 98–111)
Creatinine, Ser: 1.22 mg/dL (ref 0.61–1.24)
GFR calc Af Amer: >60 mL/min (ref >60)
GFR calc non Af Amer: 55 mL/min — ABNORMAL LOW (ref >60)
Glucose, Bld: 82 mg/dL (ref 70–99)
Potassium: 4.6 mmol/L (ref 3.5–5.1)
Sodium: 141 mmol/L (ref 135–145)
Total Bilirubin: 0.9 mg/dL (ref 0.3–1.2)
Total Protein: 6.4 g/dL — ABNORMAL LOW (ref 6.5–8.1)

## 2019-01-25 LAB — CBC
HCT: 33.9 % — ABNORMAL LOW (ref 39.0–52.0)
Hemoglobin: 11.2 g/dL — ABNORMAL LOW (ref 13.0–17.0)
MCH: 29.8 pg (ref 26.0–34.0)
MCHC: 33 g/dL (ref 30.0–36.0)
MCV: 90.2 fL (ref 80.0–100.0)
Platelets: 134 10*3/uL — ABNORMAL LOW (ref 150–400)
RBC: 3.76 MIL/uL — ABNORMAL LOW (ref 4.22–5.81)
RDW: 14.5 % (ref 11.5–15.5)
WBC: 15.2 10*3/uL — ABNORMAL HIGH (ref 4.0–10.5)
nRBC: 0 % (ref 0.0–0.2)

## 2019-01-25 LAB — TROPONIN I (HIGH SENSITIVITY)
Troponin I (High Sensitivity): 36 ng/L — ABNORMAL HIGH (ref <18)
Troponin I (High Sensitivity): 47 ng/L — ABNORMAL HIGH (ref <18)
Troponin I (High Sensitivity): 58 ng/L — ABNORMAL HIGH (ref <18)

## 2019-01-25 LAB — LITHIUM LEVEL: Lithium Lvl: <0.06 mmol/L — ABNORMAL LOW (ref 0.60–1.20)

## 2019-01-25 LAB — SALICYLATE LEVEL: Salicylate Lvl: <7 mg/dL (ref 2.8–30.0)

## 2019-01-25 LAB — ETHANOL: Alcohol, Ethyl (B): <10 mg/dL (ref <10)

## 2019-01-25 MED ORDER — SODIUM CHLORIDE 0.9 % IV SOLN
INTRAVENOUS | Status: AC
  Administered 2019-01-25: 09:00:00 via INTRAVENOUS

## 2019-01-25 NOTE — Evaluation (Signed)
Physical Therapy Evaluation Patient Details Name: Steven [name] MRN: [id number] DOB: Jun 25, 1935 Today's Date: 01/25/2019   History of Present Illness  Pt is a 84 y.o. M with significant PMH of atrial flutter, CAD s/p PCI, CKD stage III, gout who presents on 7/28 with altered mental status after having worked outside in extreme heat. Found to have temperature of 105. UA suggestive of UTI. Started on IV fluids and antibiotics.   Clinical Impression  Pt admitted with above. Prior to admission, pt lives with his son, drives, and is community ambulatory without an assistive device. On PT evaluation, pt out of bed in chair, reports having "chills." Ambulating 200 feet with supervision for safety. Noted mild dynamic balance deficits and postural abnormalities. HR stable. Will likely progress well. No PT follow up recommended.     Follow Up Recommendations No PT follow up;Supervision - Intermittent    Equipment Recommendations  None recommended by PT    Recommendations for Other Services       Precautions / Restrictions Precautions Precautions: Fall Restrictions Weight Bearing Restrictions: No      Mobility  Bed Mobility               General bed mobility comments: OOB in chair  Transfers Overall transfer level: Modified independent Equipment used: None                Ambulation/Gait Ambulation/Gait assistance: Supervision Gait Distance (Feet): 200 Feet Assistive device: None Gait Pattern/deviations: Step-through pattern;Trunk flexed;Decreased stride length;Wide base of support Gait velocity: decr   General Gait Details: Pt ambulating with mild instability, but no overt LOB, trunk in slightly flexed position  Stairs            Wheelchair Mobility    Modified Rankin (Stroke Patients Only)       Balance Overall balance assessment: Mild deficits observed, not formally tested                                           Pertinent  Vitals/Pain Pain Assessment: No/denies pain    Home Living Family/patient expects to be discharged to:: Private residence Living Arrangements: Children(son) Available Help at Discharge: Family Type of Home: House Home Access: Level entry     Home Layout: Two level        Prior Function Level of Independence: Independent         Comments: Drives, community ambulator, reports my "children won't let me do yard work anymore."     Journalist, newspaper        Extremity/Trunk Assessment   Upper Extremity Assessment Upper Extremity Assessment: Overall WFL for tasks assessed    Lower Extremity Assessment Lower Extremity Assessment: Overall WFL for tasks assessed       Communication   Communication: No difficulties  Cognition Arousal/Alertness: Awake/alert Behavior During Therapy: Flat affect Overall Cognitive Status: Within Functional Limits for tasks assessed                                 General Comments: A&Ox4, responding to questions appropriately      General Comments      Exercises     Assessment/Plan    PT Assessment Patient needs continued PT services  PT Problem List Decreased balance;Decreased mobility       PT Treatment Interventions Gait training;Functional  mobility training;Stair training;Therapeutic exercise;Therapeutic activities;Balance training;Patient/family education    PT Goals (Current goals can be found in the Care Plan section)  Acute Rehab PT Goals Patient Stated Goal: none stated; agreeable to therapy PT Goal Formulation: With patient Time For Goal Achievement: 02/08/19 Potential to Achieve Goals: Good    Frequency Min 3X/week   Barriers to discharge        Co-evaluation               AM-PAC PT "6 Clicks" Mobility  Outcome Measure Help needed turning from your back to your side while in a flat bed without using bedrails?: None Help needed moving from lying on your back to sitting on the side of a flat bed  without using bedrails?: None Help needed moving to and from a bed to a chair (including a wheelchair)?: None Help needed standing up from a chair using your arms (e.g., wheelchair or bedside chair)?: None Help needed to walk in hospital room?: None Help needed climbing 3-5 steps with a railing? : A Little 6 Click Score: 23    End of Session   Activity Tolerance: Patient tolerated treatment well Patient left: in chair;with call bell/phone within reach   PT Visit Diagnosis: Unsteadiness on feet (R26.81)    Time: 8372-9021 PT Time Calculation (min) (ACUTE ONLY): 16 min   Charges:   PT Evaluation $PT Eval Moderate Complexity: 1 Mod          Ellamae Sia, PT, DPT Acute Rehabilitation Services Pager [phone] Office [phone]   Willy Eddy 01/25/2019, 4:01 PM

## 2019-01-25 NOTE — Progress Notes (Signed)
Patient ID: Steven [name], male   DOB: September 15, 1934, 84 y.o.   MRN: [id number]  PROGRESS NOTE    Marquavius Scaife Burford  SWF:[id number] DOB: 09/17/1934 DOA: 01/24/2019 PCP: Hulan Fess, MD   Brief Narrative:  84 year old male with history of atrial flutter, CAD status post PCI, CKD stage III, enlarged prostate, gout presented on 01/24/2019 with altered mental status after having working outside in the extreme heat.  He was found to have temperature of 105.  UA was suggestive of UTI.  He was started on IV fluids and antibiotics.  Assessment & Plan:   Sepsis secondary to UTI: Present on admission UTI Severe hypothermia Acute metabolic encephalopathy -Presented with a temperature of 105 after having working outside in the extreme heat. -Patient was confused on presentation.  Urinalysis was consistent with UTI.  Currently on Rocephin.  Follow cultures. -Mental status has much improved.  Fall precautions.  Monitor mental status.  Physical therapy evaluation.  Continue IV fluids at 75 cc an hour. -Temperature is improving.  Leukocytosis -Probably from above.  Monitor.  Thrombocytopenia -Questionable device.  No signs of bleeding.  Monitor  Chronic renal disease stage III -Creatinine stable.  Monitor  Anemia of chronic disease  -Probably due to renal disease.  Hemoglobin stable.  Monitor.  Generalized conditioning -PT eval    DVT prophylaxis: Heparin Code Status: Full Family Communication: None at bedside.  Patient did not want me to communicate with any of his family member Disposition Plan: Home in 1 to 2 days if clinically improves  Consultants: None  Procedures: None Antimicrobials: Rocephin from 01/24/2019 onwards   Subjective: Patient seen and examined at bedside.  He feels slightly better.  Still feels weak.  No overnight fever or vomiting reported.  No diarrhea or worsening abdominal pain. Objective: Vitals:   01/25/19 0200 01/25/19 0546 01/25/19 0926 01/25/19 1204  BP: (!)  113/55 122/65 (!) 146/58 (!) 156/63  Pulse: (!) 57 65 73 65  Resp: 17 16 18 18   Temp:  98.6 F (37 C) 98.6 F (37 C) 97.7 F (36.5 C)  TempSrc:    Oral  SpO2: 97% 100% 99% 100%    Intake/Output Summary (Last 24 hours) at 01/25/2019 1501 Last data filed at 01/25/2019 1035 Gross per 24 hour  Intake 3465.42 ml  Output 150 ml  Net 3315.42 ml   There were no vitals filed for this visit.  Examination:  General exam: Appears calm and comfortable.  Elderly male sitting on chair. Respiratory system: Bilateral decreased breath sounds at bases Cardiovascular system: S1 & S2 heard, Rate controlled Gastrointestinal system: Abdomen is nondistended, soft and nontender. Normal bowel sounds heard. Extremities: No cyanosis, clubbing, edema    Data Reviewed: I have personally reviewed following labs and imaging studies  CBC: Recent Labs  Lab 01/24/19 1443 01/24/19 1617 01/25/19 0256  WBC 5.0  --  15.2*  NEUTROABS 4.4  --   --   HGB 12.0* 11.6* 11.2*  HCT 36.1* 34.0* 33.9*  MCV 87.4  --  90.2  PLT 128*  --  220*   Basic Metabolic Panel: Recent Labs  Lab 01/24/19 1443 01/24/19 1617 01/25/19 0256  NA 137 140 141  K 4.4 4.1 4.6  CL 103  --  109  CO2 23  --  23  GLUCOSE 93  --  82  BUN 27*  --  21  CREATININE 1.39*  --  1.22  CALCIUM 9.0  --  8.3*   GFR: CrCl cannot be calculated (Unknown  ideal weight.). Liver Function Tests: Recent Labs  Lab 01/24/19 1443 01/25/19 0256  AST 28 34  ALT 19 22  ALKPHOS 58 54  BILITOT 0.9 0.9  PROT 7.3 6.4*  ALBUMIN 3.8 3.1*   No results for input(s): LIPASE, AMYLASE in the last 168 hours. No results for input(s): AMMONIA in the last 168 hours. Coagulation Profile: Recent Labs  Lab 01/24/19 1540  INR 1.2   Cardiac Enzymes: Recent Labs  Lab 01/24/19 1443  CKTOTAL 153   BNP (last 3 results) No results for input(s): PROBNP in the last 8760 hours. HbA1C: No results for input(s): HGBA1C in the last 72 hours. CBG: No results  for input(s): GLUCAP in the last 168 hours. Lipid Profile: No results for input(s): CHOL, HDL, LDLCALC, TRIG, CHOLHDL, LDLDIRECT in the last 72 hours. Thyroid Function Tests: No results for input(s): TSH, T4TOTAL, FREET4, T3FREE, THYROIDAB in the last 72 hours. Anemia Panel: No results for input(s): VITAMINB12, FOLATE, FERRITIN, TIBC, IRON, RETICCTPCT in the last 72 hours. Sepsis Labs: Recent Labs  Lab 01/24/19 1509  LATICACIDVEN 1.8    Recent Results (from the past 240 hour(s))  SARS Coronavirus 2 (CEPHEID - Performed in [facility] hospital lab), Hosp Order     Status: None   Collection Time: 01/24/19  4:38 PM   Specimen: Nasopharyngeal Swab  Result Value Ref Range Status   SARS Coronavirus 2 NEGATIVE NEGATIVE Final    Comment: (NOTE) If result is NEGATIVE SARS-CoV-2 target nucleic acids are NOT DETECTED. The SARS-CoV-2 RNA is generally detectable in upper and lower  respiratory specimens during the acute phase of infection. The lowest  concentration of SARS-CoV-2 viral copies this assay can detect is 250  copies / mL. A negative result does not preclude SARS-CoV-2 infection  and should not be used as the sole basis for treatment or other  patient management decisions.  A negative result may occur with  improper specimen collection / handling, submission of specimen other  than nasopharyngeal swab, presence of viral mutation(s) within the  areas targeted by this assay, and inadequate number of viral copies  (<250 copies / mL). A negative result must be combined with clinical  observations, patient history, and epidemiological information. If result is POSITIVE SARS-CoV-2 target nucleic acids are DETECTED. The SARS-CoV-2 RNA is generally detectable in upper and lower  respiratory specimens dur ing the acute phase of infection.  Positive  results are indicative of active infection with SARS-CoV-2.  Clinical  correlation with patient history and other diagnostic information is   necessary to determine patient infection status.  Positive results do  not rule out bacterial infection or co-infection with other viruses. If result is PRESUMPTIVE POSTIVE SARS-CoV-2 nucleic acids MAY BE PRESENT.   A presumptive positive result was obtained on the submitted specimen  and confirmed on repeat testing.  While 2019 novel coronavirus  (SARS-CoV-2) nucleic acids may be present in the submitted sample  additional confirmatory testing may be necessary for epidemiological  and / or clinical management purposes  to differentiate between  SARS-CoV-2 and other Sarbecovirus currently known to infect humans.  If clinically indicated additional testing with an alternate test  methodology [phone]) is advised. The SARS-CoV-2 RNA is generally  detectable in upper and lower respiratory sp ecimens during the acute  phase of infection. The expected result is Negative. Fact Sheet for Patients:  StrictlyIdeas.no Fact Sheet for Healthcare Providers: BankingDealers.co.za This test is not yet approved or cleared by the [place] FDA and has been  authorized for detection and/or diagnosis of SARS-CoV-2 by FDA under an Emergency Use Authorization (EUA).  This EUA will remain in effect (meaning this test can be used) for the duration of the COVID-19 declaration under Section 564(b)(1) of the Act, 21 U.S.C. section 360bbb-3(b)(1), unless the authorization is terminated or revoked sooner. Performed at [facility], [place] [address], [place], [place] [address]   Blood Culture (routine x 2)     Status: None (Preliminary result)   Collection Time: 01/24/19  7:32 PM   Specimen: BLOOD LEFT HAND  Result Value Ref Range Status   Specimen Description BLOOD LEFT HAND  Final   Special Requests   Final    BOTTLES DRAWN AEROBIC AND ANAEROBIC Blood Culture results may not be optimal due to an inadequate volume of blood received in culture bottles    Culture   Final    NO GROWTH < 12 HOURS Performed at [facility], [place] [address], [place], [place] [address]    Report Status PENDING  Incomplete  Blood Culture (routine x 2)     Status: None (Preliminary result)   Collection Time: 01/24/19  7:52 PM   Specimen: BLOOD LEFT ARM  Result Value Ref Range Status   Specimen Description BLOOD LEFT ARM  Final   Special Requests   Final    BOTTLES DRAWN AEROBIC AND ANAEROBIC Blood Culture results may not be optimal due to an inadequate volume of blood received in culture bottles   Culture   Final    NO GROWTH < 12 HOURS Performed at [facility], [place] [address], [place], [place] [address]    Report Status PENDING  Incomplete         Radiology Studies: Ct Head Wo Contrast  Result Date: 01/24/2019 CLINICAL DATA:  Altered level of consciousness. EXAM: CT HEAD WITHOUT CONTRAST TECHNIQUE: Contiguous axial images were obtained from the base of the skull through the vertex without intravenous contrast. COMPARISON:  MR brain December 13, 2015 FINDINGS: Brain: No evidence of acute infarction, hemorrhage, hydrocephalus, extra-axial collection or mass lesion/mass effect. There is chronic diffuse atrophy. Chronic bilateral periventricular white matter small vessel ischemic changes are noted. Vascular: No hyperdense vessel is identified. Skull: Normal. Negative for fracture or focal lesion. Sinuses/Orbits: No acute finding. Other: None. IMPRESSION: No focal acute intracranial abnormality identified. Electronically Signed   By: Abelardo Diesel M.D.   On: 01/24/2019 18:55   Dg Chest Port 1 View  Result Date: 01/24/2019 CLINICAL DATA:  Heat related illness EXAM: PORTABLE CHEST 1 VIEW COMPARISON:  April 01, 2018 FINDINGS: The lungs are clear without focal consolidation, edema, effusion or pneumothorax. Cardiomediastinal silhouette is within normal limits for size. Aortic knob calcifications are seen. No acute osseous abnormality. IMPRESSION: No acute  cardiopulmonary process. Electronically Signed   By: Prudencio Pair M.D.   On: 01/24/2019 15:38        Scheduled Meds: . heparin  5,000 Units Subcutaneous Q8H   Continuous Infusions: . sodium chloride 75 mL/hr at 01/25/19 0857  . cefTRIAXone (ROCEPHIN)  IV Stopped (01/24/19 2032)     LOS: 1 day        Aline August, MD [facility] 01/25/2019, 3:01 PM

## 2019-01-26 LAB — COMPREHENSIVE METABOLIC PANEL
ALT: 21 U/L (ref 0–44)
AST: 33 U/L (ref 15–41)
Albumin: 2.9 g/dL — ABNORMAL LOW (ref 3.5–5.0)
Alkaline Phosphatase: 52 U/L (ref 38–126)
Anion gap: 8 (ref 5–15)
BUN: 21 mg/dL (ref 8–23)
CO2: 24 mmol/L (ref 22–32)
Calcium: 8.4 mg/dL — ABNORMAL LOW (ref 8.9–10.3)
Chloride: 107 mmol/L (ref 98–111)
Creatinine, Ser: 1.24 mg/dL (ref 0.61–1.24)
GFR calc Af Amer: >60 mL/min (ref >60)
GFR calc non Af Amer: 54 mL/min — ABNORMAL LOW (ref >60)
Glucose, Bld: 97 mg/dL (ref 70–99)
Potassium: 3.9 mmol/L (ref 3.5–5.1)
Sodium: 139 mmol/L (ref 135–145)
Total Bilirubin: 0.7 mg/dL (ref 0.3–1.2)
Total Protein: 6.3 g/dL — ABNORMAL LOW (ref 6.5–8.1)

## 2019-01-26 LAB — CBC WITH DIFFERENTIAL/PLATELET
Abs Immature Granulocytes: 0.02 10*3/uL (ref 0.00–0.07)
Basophils Absolute: 0 10*3/uL (ref 0.0–0.1)
Basophils Relative: 0 %
Eosinophils Absolute: 0 10*3/uL (ref 0.0–0.5)
Eosinophils Relative: 0 %
HCT: 30.5 % — ABNORMAL LOW (ref 39.0–52.0)
Hemoglobin: 10.1 g/dL — ABNORMAL LOW (ref 13.0–17.0)
Immature Granulocytes: 0 %
Lymphocytes Relative: 16 %
Lymphs Abs: 1.3 10*3/uL (ref 0.7–4.0)
MCH: 29.2 pg (ref 26.0–34.0)
MCHC: 33.1 g/dL (ref 30.0–36.0)
MCV: 88.2 fL (ref 80.0–100.0)
Monocytes Absolute: 1 10*3/uL (ref 0.1–1.0)
Monocytes Relative: 13 %
Neutro Abs: 5.7 10*3/uL (ref 1.7–7.7)
Neutrophils Relative %: 71 %
Platelets: 126 10*3/uL — ABNORMAL LOW (ref 150–400)
RBC: 3.46 MIL/uL — ABNORMAL LOW (ref 4.22–5.81)
RDW: 14.3 % (ref 11.5–15.5)
WBC: 8.1 10*3/uL (ref 4.0–10.5)
nRBC: 0 % (ref 0.0–0.2)

## 2019-01-26 LAB — MAGNESIUM: Magnesium: 1.8 mg/dL (ref 1.7–2.4)

## 2019-01-26 MED ORDER — ATORVASTATIN CALCIUM 40 MG PO TABS
40.0000 mg | ORAL_TABLET | Freq: Every day | ORAL | Status: DC
  Administered 2019-01-26: 18:00:00 40 mg via ORAL
  Filled 2019-01-26: qty 1

## 2019-01-26 MED ORDER — ASPIRIN EC 81 MG PO TBEC
81.0000 mg | DELAYED_RELEASE_TABLET | Freq: Every day | ORAL | Status: DC
  Administered 2019-01-26 – 2019-01-27 (×2): 81 mg via ORAL
  Filled 2019-01-26 (×2): qty 1

## 2019-01-26 MED ORDER — CLOPIDOGREL BISULFATE 75 MG PO TABS
75.0000 mg | ORAL_TABLET | Freq: Every day | ORAL | Status: DC
  Administered 2019-01-26 – 2019-01-27 (×2): 75 mg via ORAL
  Filled 2019-01-26 (×2): qty 1

## 2019-01-26 MED ORDER — SODIUM CHLORIDE 0.9 % IV SOLN
INTRAVENOUS | Status: DC | PRN
  Administered 2019-01-26: 23:00:00 100 mL via INTRAVENOUS

## 2019-01-26 MED ORDER — METOPROLOL SUCCINATE ER 50 MG PO TB24
50.0000 mg | ORAL_TABLET | Freq: Every day | ORAL | Status: DC
  Administered 2019-01-26 – 2019-01-27 (×2): 50 mg via ORAL
  Filled 2019-01-26 (×2): qty 1

## 2019-01-26 NOTE — Progress Notes (Signed)
Daughter dropped off patient's clothes at the [facility] front desk this morning. The [employer] brought the patient's clothing to the [facility] and this RN took the patient's clothing to the patient's room. Patient was happy to see that his daughter had brought his clothes to him.

## 2019-01-26 NOTE — Progress Notes (Signed)
Physical Therapy Treatment Patient Details Name: Steven [name] MRN: [id number] DOB: May 18, 1935 Today's Date: 01/26/2019    History of Present Illness Pt is a 84 y.o. M with significant PMH of atrial flutter, CAD s/p PCI, CKD stage III, gout who presents on 7/28 with altered mental status after having worked outside in extreme heat. Found to have temperature of 105. UA suggestive of UTI. Started on IV fluids and antibiotics.     PT Comments    Pt progressing towards goals. Pt requiring min guard to supervision for gait this session. Practiced stair navigation and pt with 1 LOB requiring min A. Otherwise requiring min guard A for stair navigation. Educated about safe stair navigation at home and having family to supervise initially.  Current recommendations appropriate. Will continue to follow acutely to maximize functional mobility independence and safety.    Follow Up Recommendations  No PT follow up;Supervision - Intermittent     Equipment Recommendations  None recommended by PT    Recommendations for Other Services       Precautions / Restrictions Precautions Precautions: Fall Restrictions Weight Bearing Restrictions: No    Mobility  Bed Mobility               General bed mobility comments: OOB in chair  Transfers Overall transfer level: Modified independent Equipment used: None                Ambulation/Gait Ambulation/Gait assistance: Supervision;Min guard Gait Distance (Feet): 200 Feet Assistive device: None Gait Pattern/deviations: Step-through pattern;Trunk flexed;Decreased stride length;Wide base of support Gait velocity: decr   General Gait Details: Mild instability noted, however, no LOB noted. trunk in slightly flexed position   Stairs Stairs: Yes Stairs assistance: Min assist;Min guard Stair Management: One rail Right;Alternating pattern;Step to pattern;Forwards Number of Stairs: 10 General stair comments: Pt requiring min guard A for  ascending steps using alternating pattern. Pt with LOB coming down steps using alternating pattern and required min A for steadying. Educated/performed step to pattern for remainder of steps to increase safety. Educated about using step to pattern at home to increase safety and need for assist from son.    Wheelchair Mobility    Modified Rankin (Stroke Patients Only)       Balance Overall balance assessment: Mild deficits observed, not formally tested                                          Cognition Arousal/Alertness: Awake/alert Behavior During Therapy: Flat affect Overall Cognitive Status: Within Functional Limits for tasks assessed                                        Exercises      General Comments        Pertinent Vitals/Pain Pain Assessment: No/denies pain    Home Living                      Prior Function            PT Goals (current goals can now be found in the care plan section) Acute Rehab PT Goals Patient Stated Goal: none stated; agreeable to therapy PT Goal Formulation: With patient Time For Goal Achievement: 02/08/19 Potential to Achieve Goals: Good Progress towards PT goals: Progressing toward  goals    Frequency    Min 3X/week      PT Plan Current plan remains appropriate    Co-evaluation              AM-PAC PT "6 Clicks" Mobility   Outcome Measure  Help needed turning from your back to your side while in a flat bed without using bedrails?: None Help needed moving from lying on your back to sitting on the side of a flat bed without using bedrails?: None Help needed moving to and from a bed to a chair (including a wheelchair)?: None Help needed standing up from a chair using your arms (e.g., wheelchair or bedside chair)?: None Help needed to walk in hospital room?: A Little Help needed climbing 3-5 steps with a railing? : A Little 6 Click Score: 22    End of Session Equipment  Utilized During Treatment: Gait belt Activity Tolerance: Patient tolerated treatment well Patient left: in chair;with call bell/phone within reach Nurse Communication: Mobility status PT Visit Diagnosis: Unsteadiness on feet (R26.81)     Time: 7169-6789 PT Time Calculation (min) (ACUTE ONLY): 12 min  Charges:  $Gait Training: 8-22 mins                     Leighton Ruff, PT, DPT  Acute Rehabilitation Services  Pager: [phone] Office: [phone]    Rudean Hitt 01/26/2019, 1:14 PM

## 2019-01-26 NOTE — Progress Notes (Signed)
Patient ID: Steven [name], male   DOB: 09-18-1934, 84 y.o.   MRN: [id number]  PROGRESS NOTE    Steven [name]  XHB:[id number] DOB: 12-21-1934 DOA: 01/24/2019 PCP: Hulan Fess, MD   Brief Narrative:  84 year old male with history of atrial flutter, CAD status post PCI, CKD stage III, enlarged prostate, gout presented on 01/24/2019 with altered mental status after having working outside in the extreme heat.  He was found to have temperature of 105.  UA was suggestive of UTI.  He was started on IV fluids and antibiotics.  Assessment & Plan:   Sepsis secondary to UTI: Present on admission UTI Severe hypothermia Acute metabolic encephalopathy -Presented with a temperature of 105 after having working outside in the extreme heat. -Patient was confused on presentation.  Urinalysis was consistent with UTI.  Currently on Rocephin.  Cultures negative so far.  Had a temperature max of 100.9 yesterday evening.  We will monitor him for 1 more day and continue Rocephin for now. -Mental status has much improved.  Fall precautions.  Monitor mental status.  Physical therapy evaluation appreciated.  DC IV fluids.  Leukocytosis -Probably from above.  Resolved.  Thrombocytopenia -Questionable device.  No signs of bleeding.  Monitor  Chronic renal disease stage III -Creatinine stable.  Monitor  Anemia of chronic disease  -Probably due to renal disease.  Hemoglobin stable.  Monitor.   DVT prophylaxis: Heparin Code Status: Full Family Communication: None at bedside.  Disposition Plan: Home in 1 to 2 days if clinically improves and remains fever free.  Consultants: None  Procedures: None Antimicrobials: Rocephin from 01/24/2019 onwards   Subjective: Patient seen and examined at bedside.  Had fever overnight.  No overnight nausea, vomiting, worsening abdominal pain or diarrhea.  Feels better but still feels weak. Objective: Vitals:   01/26/19 0104 01/26/19 0444 01/26/19 0537 01/26/19 0809  BP:  (!) 153/64  (!) 159/63   Pulse: 79  75   Resp: 16  16   Temp: 99.2 F (37.3 C) 98.9 F (37.2 C)  99.3 F (37.4 C)  TempSrc:    Oral  SpO2: 98%  100%     Intake/Output Summary (Last 24 hours) at 01/26/2019 0959 Last data filed at 01/26/2019 0900 Gross per 24 hour  Intake 1778.61 ml  Output 1925 ml  Net -146.39 ml   There were no vitals filed for this visit.  Examination:  General exam: No acute distress.  Elderly male sitting on chair. Respiratory system: Bilateral decreased breath sounds at bases.  No wheezing Cardiovascular system: Rate controlled, S1-S2 heard Gastrointestinal system: Abdomen is nondistended, soft and nontender. Normal bowel sounds heard. Extremities: No cyanosis, edema    Data Reviewed: I have personally reviewed following labs and imaging studies  CBC: Recent Labs  Lab 01/24/19 1443 01/24/19 1617 01/25/19 0256 01/26/19 0242  WBC 5.0  --  15.2* 8.1  NEUTROABS 4.4  --   --  5.7  HGB 12.0* 11.6* 11.2* 10.1*  HCT 36.1* 34.0* 33.9* 30.5*  MCV 87.4  --  90.2 88.2  PLT 128*  --  134* 810*   Basic Metabolic Panel: Recent Labs  Lab 01/24/19 1443 01/24/19 1617 01/25/19 0256 01/26/19 0242  NA 137 140 141 139  K 4.4 4.1 4.6 3.9  CL 103  --  109 107  CO2 23  --  23 24  GLUCOSE 93  --  82 97  BUN 27*  --  21 21  CREATININE 1.39*  --  1.22 1.24  CALCIUM 9.0  --  8.3* 8.4*  MG  --   --   --  1.8   GFR: CrCl cannot be calculated (Unknown ideal weight.). Liver Function Tests: Recent Labs  Lab 01/24/19 1443 01/25/19 0256 01/26/19 0242  AST 28 34 33  ALT 19 22 21   ALKPHOS 58 54 52  BILITOT 0.9 0.9 0.7  PROT 7.3 6.4* 6.3*  ALBUMIN 3.8 3.1* 2.9*   No results for input(s): LIPASE, AMYLASE in the last 168 hours. No results for input(s): AMMONIA in the last 168 hours. Coagulation Profile: Recent Labs  Lab 01/24/19 1540  INR 1.2   Cardiac Enzymes: Recent Labs  Lab 01/24/19 1443  CKTOTAL 153   BNP (last 3 results) No results for  input(s): PROBNP in the last 8760 hours. HbA1C: No results for input(s): HGBA1C in the last 72 hours. CBG: No results for input(s): GLUCAP in the last 168 hours. Lipid Profile: No results for input(s): CHOL, HDL, LDLCALC, TRIG, CHOLHDL, LDLDIRECT in the last 72 hours. Thyroid Function Tests: No results for input(s): TSH, T4TOTAL, FREET4, T3FREE, THYROIDAB in the last 72 hours. Anemia Panel: No results for input(s): VITAMINB12, FOLATE, FERRITIN, TIBC, IRON, RETICCTPCT in the last 72 hours. Sepsis Labs: Recent Labs  Lab 01/24/19 1509  LATICACIDVEN 1.8    Recent Results (from the past 240 hour(s))  SARS Coronavirus 2 (CEPHEID - Performed in [facility] hospital lab), Hosp Order     Status: None   Collection Time: 01/24/19  4:38 PM   Specimen: Nasopharyngeal Swab  Result Value Ref Range Status   SARS Coronavirus 2 NEGATIVE NEGATIVE Final    Comment: (NOTE) If result is NEGATIVE SARS-CoV-2 target nucleic acids are NOT DETECTED. The SARS-CoV-2 RNA is generally detectable in upper and lower  respiratory specimens during the acute phase of infection. The lowest  concentration of SARS-CoV-2 viral copies this assay can detect is 250  copies / mL. A negative result does not preclude SARS-CoV-2 infection  and should not be used as the sole basis for treatment or other  patient management decisions.  A negative result may occur with  improper specimen collection / handling, submission of specimen other  than nasopharyngeal swab, presence of viral mutation(s) within the  areas targeted by this assay, and inadequate number of viral copies  (<250 copies / mL). A negative result must be combined with clinical  observations, patient history, and epidemiological information. If result is POSITIVE SARS-CoV-2 target nucleic acids are DETECTED. The SARS-CoV-2 RNA is generally detectable in upper and lower  respiratory specimens dur ing the acute phase of infection.  Positive  results are  indicative of active infection with SARS-CoV-2.  Clinical  correlation with patient history and other diagnostic information is  necessary to determine patient infection status.  Positive results do  not rule out bacterial infection or co-infection with other viruses. If result is PRESUMPTIVE POSTIVE SARS-CoV-2 nucleic acids MAY BE PRESENT.   A presumptive positive result was obtained on the submitted specimen  and confirmed on repeat testing.  While 2019 novel coronavirus  (SARS-CoV-2) nucleic acids may be present in the submitted sample  additional confirmatory testing may be necessary for epidemiological  and / or clinical management purposes  to differentiate between  SARS-CoV-2 and other Sarbecovirus currently known to infect humans.  If clinically indicated additional testing with an alternate test  methodology [phone]) is advised. The SARS-CoV-2 RNA is generally  detectable in upper and lower respiratory sp ecimens during the acute  phase  of infection. The expected result is Negative. Fact Sheet for Patients:  StrictlyIdeas.no Fact Sheet for Healthcare Providers: BankingDealers.co.za This test is not yet approved or cleared by the [place] FDA and has been authorized for detection and/or diagnosis of SARS-CoV-2 by FDA under an Emergency Use Authorization (EUA).  This EUA will remain in effect (meaning this test can be used) for the duration of the COVID-19 declaration under Section 564(b)(1) of the Act, 21 U.S.C. section 360bbb-3(b)(1), unless the authorization is terminated or revoked sooner. Performed at [facility], [place] [address], [place], [place] [address]   Blood Culture (routine x 2)     Status: None (Preliminary result)   Collection Time: 01/24/19  7:32 PM   Specimen: BLOOD LEFT HAND  Result Value Ref Range Status   Specimen Description BLOOD LEFT HAND  Final   Special Requests   Final    BOTTLES DRAWN  AEROBIC AND ANAEROBIC Blood Culture results may not be optimal due to an inadequate volume of blood received in culture bottles   Culture   Final    NO GROWTH 2 DAYS Performed at [facility], [place] [address], [place], [place] [address]    Report Status PENDING  Incomplete  Blood Culture (routine x 2)     Status: None (Preliminary result)   Collection Time: 01/24/19  7:52 PM   Specimen: BLOOD LEFT ARM  Result Value Ref Range Status   Specimen Description BLOOD LEFT ARM  Final   Special Requests   Final    BOTTLES DRAWN AEROBIC AND ANAEROBIC Blood Culture results may not be optimal due to an inadequate volume of blood received in culture bottles   Culture   Final    NO GROWTH 2 DAYS Performed at [facility], [place] [address] [place], [place] [address]    Report Status PENDING  Incomplete         Radiology Studies: Ct Head Wo Contrast  Result Date: 01/24/2019 CLINICAL DATA:  Altered level of consciousness. EXAM: CT HEAD WITHOUT CONTRAST TECHNIQUE: Contiguous axial images were obtained from the base of the skull through the vertex without intravenous contrast. COMPARISON:  MR brain December 13, 2015 FINDINGS: Brain: No evidence of acute infarction, hemorrhage, hydrocephalus, extra-axial collection or mass lesion/mass effect. There is chronic diffuse atrophy. Chronic bilateral periventricular white matter small vessel ischemic changes are noted. Vascular: No hyperdense vessel is identified. Skull: Normal. Negative for fracture or focal lesion. Sinuses/Orbits: No acute finding. Other: None. IMPRESSION: No focal acute intracranial abnormality identified. Electronically Signed   By: Abelardo Diesel M.D.   On: 01/24/2019 18:55   Dg Chest Port 1 View  Result Date: 01/24/2019 CLINICAL DATA:  Heat related illness EXAM: PORTABLE CHEST 1 VIEW COMPARISON:  April 01, 2018 FINDINGS: The lungs are clear without focal consolidation, edema, effusion or pneumothorax. Cardiomediastinal silhouette is  within normal limits for size. Aortic knob calcifications are seen. No acute osseous abnormality. IMPRESSION: No acute cardiopulmonary process. Electronically Signed   By: Prudencio Pair M.D.   On: 01/24/2019 15:38        Scheduled Meds: . heparin  5,000 Units Subcutaneous Q8H   Continuous Infusions: . cefTRIAXone (ROCEPHIN)  IV 1 g (01/25/19 1939)     LOS: 2 days        Aline August, MD [facility] 01/26/2019, 9:59 AM

## 2019-01-27 LAB — CBC WITH DIFFERENTIAL/PLATELET
Abs Immature Granulocytes: 0.01 10*3/uL (ref 0.00–0.07)
Basophils Absolute: 0 10*3/uL (ref 0.0–0.1)
Basophils Relative: 1 %
Eosinophils Absolute: 0.1 10*3/uL (ref 0.0–0.5)
Eosinophils Relative: 3 %
HCT: 32.2 % — ABNORMAL LOW (ref 39.0–52.0)
Hemoglobin: 11 g/dL — ABNORMAL LOW (ref 13.0–17.0)
Immature Granulocytes: 0 %
Lymphocytes Relative: 25 %
Lymphs Abs: 1 10*3/uL (ref 0.7–4.0)
MCH: 29.6 pg (ref 26.0–34.0)
MCHC: 34.2 g/dL (ref 30.0–36.0)
MCV: 86.6 fL (ref 80.0–100.0)
Monocytes Absolute: 0.8 10*3/uL (ref 0.1–1.0)
Monocytes Relative: 19 %
Neutro Abs: 2.1 10*3/uL (ref 1.7–7.7)
Neutrophils Relative %: 52 %
Platelets: 132 10*3/uL — ABNORMAL LOW (ref 150–400)
RBC: 3.72 MIL/uL — ABNORMAL LOW (ref 4.22–5.81)
RDW: 14.1 % (ref 11.5–15.5)
WBC: 4.1 10*3/uL (ref 4.0–10.5)
nRBC: 0 % (ref 0.0–0.2)

## 2019-01-27 LAB — URINE CULTURE: Culture: >=100000 — AB

## 2019-01-27 LAB — BASIC METABOLIC PANEL
Anion gap: 8 (ref 5–15)
BUN: 18 mg/dL (ref 8–23)
CO2: 26 mmol/L (ref 22–32)
Calcium: 8.8 mg/dL — ABNORMAL LOW (ref 8.9–10.3)
Chloride: 106 mmol/L (ref 98–111)
Creatinine, Ser: 1.13 mg/dL (ref 0.61–1.24)
GFR calc Af Amer: >60 mL/min (ref >60)
GFR calc non Af Amer: >60 mL/min (ref >60)
Glucose, Bld: 93 mg/dL (ref 70–99)
Potassium: 4 mmol/L (ref 3.5–5.1)
Sodium: 140 mmol/L (ref 135–145)

## 2019-01-27 LAB — MAGNESIUM: Magnesium: 1.9 mg/dL (ref 1.7–2.4)

## 2019-01-27 MED ORDER — CEPHALEXIN 500 MG PO CAPS: 500.0000 mg | ORAL_CAPSULE | Freq: Three times a day (TID) | ORAL | 0 refills | Status: AC

## 2019-01-27 NOTE — Plan of Care (Signed)

## 2019-01-27 NOTE — Care Management Important Message (Signed)
Important Message  Patient Details  Name: Steven [name] MRN: [id number] Date of Birth: September 02, 1934   Medicare Important Message Given:  Yes     Porsche Noguchi 01/27/2019, 3:20 PM

## 2019-01-27 NOTE — Discharge Summary (Signed)
Physician Discharge Summary  Steven [name] FHL:[id number] DOB: 04-15-1935 DOA: 01/24/2019  PCP: Hulan Fess, MD  Admit date: 01/24/2019 Discharge date: 01/27/2019  Admitted From: Home Disposition: Home  Recommendations for Outpatient Follow-up:  1. Follow up with PCP in 1 week with repeat CBC/BMP 2. Follow up in ED if symptoms worsen or new appear   Home Health: No Equipment/Devices: None  Discharge Condition: Stable CODE STATUS: Full Diet recommendation: Heart healthy  Brief/Interim Summary: 84 year old male with history of atrial flutter, CAD status post PCI, CKD stage III, enlarged prostate, gout presented on 01/24/2019 with altered mental status after having working outside in the extreme heat.  He was found to have temperature of 105.  UA was suggestive of UTI.  He was started on IV fluids and antibiotics.  Urine culture grew E. coli.  His symptoms are improving.  He is afebrile.  Mental status improved and leukocytosis has resolved.  He will be discharged home on oral Keflex.  Discharge Diagnoses:   Sepsis secondary to UTI: Present on admission UTI Severe hypothermia Acute metabolic encephalopathy -Presented with a temperature of 105 after having working outside in the extreme heat. -Patient was confused on presentation.  Urinalysis was consistent with UTI.  Currently on Rocephin.    Urine culture grew E. coli.   Afebrile for the last 24 hours. -Mental status has much improved.   Physical therapy evaluation appreciated.   -Sepsis has resolved -We will discharge on oral Keflex for 4 more days.    Leukocytosis -Probably from above.  Resolved.  Thrombocytopenia -Questionable device.  No signs of bleeding.  Monitor  Chronic renal disease stage III -Creatinine stable.  Monitor  Anemia of chronic disease  -Probably due to renal disease.  Hemoglobin stable.  Monitor.  History of CAD  -Stable.  Continue home regimen.  Outpatient follow-up with  PCP/cardiology   Discharge Instructions  Discharge Instructions    Diet - low sodium heart healthy   Complete by: As directed    Increase activity slowly   Complete by: As directed      Allergies as of 01/27/2019   No Known Allergies     Medication List    TAKE these medications   aspirin EC 81 MG tablet Take 1 tablet (81 mg total) by mouth daily.   atorvastatin 40 MG tablet Commonly known as: LIPITOR Take 40 mg by mouth daily.   cephALEXin 500 MG capsule Commonly known as: KEFLEX Take 1 capsule (500 mg total) by mouth 3 (three) times daily for 4 days.   clopidogrel 75 MG tablet Commonly known as: PLAVIX TAKE 1 TABLET BY MOUTH EVERY DAY   clotrimazole-betamethasone cream Commonly known as: LOTRISONE Apply topically 2 (two) times daily.   Fish Oil 1000 MG Caps Take 1,000 mg by mouth daily.   metoprolol succinate 50 MG 24 hr tablet Commonly known as: TOPROL-XL Take 50 mg by mouth daily.   nitroGLYCERIN 0.4 MG SL tablet Commonly known as: Nitrostat Place 1 tablet (0.4 mg total) under the tongue every 5 (five) minutes as needed.   VITAMIN B-12 PO Take 1 tablet by mouth daily.      Follow-up Information    Little, Lennette Bihari, MD. Schedule an appointment as soon as possible for a visit in 1 week(s).   Specialty: Family Medicine Why: with cbc/bmp Contact information: [place] [address] [phone]          No Known Allergies  Consultations:  None   Procedures/Studies: Ct Head  Wo Contrast  Result Date: 01/24/2019 CLINICAL DATA:  Altered level of consciousness. EXAM: CT HEAD WITHOUT CONTRAST TECHNIQUE: Contiguous axial images were obtained from the base of the skull through the vertex without intravenous contrast. COMPARISON:  MR brain December 13, 2015 FINDINGS: Brain: No evidence of acute infarction, hemorrhage, hydrocephalus, extra-axial collection or mass lesion/mass effect. There is chronic diffuse atrophy. Chronic bilateral  periventricular white matter small vessel ischemic changes are noted. Vascular: No hyperdense vessel is identified. Skull: Normal. Negative for fracture or focal lesion. Sinuses/Orbits: No acute finding. Other: None. IMPRESSION: No focal acute intracranial abnormality identified. Electronically Signed   By: Abelardo Diesel M.D.   On: 01/24/2019 18:55   Dg Chest Port 1 View  Result Date: 01/24/2019 CLINICAL DATA:  Heat related illness EXAM: PORTABLE CHEST 1 VIEW COMPARISON:  April 01, 2018 FINDINGS: The lungs are clear without focal consolidation, edema, effusion or pneumothorax. Cardiomediastinal silhouette is within normal limits for size. Aortic knob calcifications are seen. No acute osseous abnormality. IMPRESSION: No acute cardiopulmonary process. Electronically Signed   By: Prudencio Pair M.D.   On: 01/24/2019 15:38       Subjective: Patient seen and examined at bedside sitting on chair.  He denies any overnight fever, nausea, vomiting or worsening abdominal pain.  He feels that he is ready to go home today.  Discharge Exam: Vitals:   01/27/19 0828 01/27/19 0921  BP:  (!) 152/61  Pulse:  64  Resp:  18  Temp: 98.3 F (36.8 C)   SpO2:  99%    General: Pt is alert, awake, not in acute distress Cardiovascular: rate controlled, S1/S2 + Respiratory: bilateral decreased breath sounds at bases Abdominal: Soft, NT, ND, bowel sounds + Extremities: Trace edema, no cyanosis    The results of significant diagnostics from this hospitalization (including imaging, microbiology, ancillary and laboratory) are listed below for reference.     Microbiology: Recent Results (from the past 240 hour(s))  SARS Coronavirus 2 (CEPHEID - Performed in [place] hospital lab), Hosp Order     Status: None   Collection Time: 01/24/19  4:38 PM   Specimen: Nasopharyngeal Swab  Result Value Ref Range Status   SARS Coronavirus 2 NEGATIVE NEGATIVE Final    Comment: (NOTE) If result is NEGATIVE SARS-CoV-2  target nucleic acids are NOT DETECTED. The SARS-CoV-2 RNA is generally detectable in upper and lower  respiratory specimens during the acute phase of infection. The lowest  concentration of SARS-CoV-2 viral copies this assay can detect is 250  copies / mL. A negative result does not preclude SARS-CoV-2 infection  and should not be used as the sole basis for treatment or other  patient management decisions.  A negative result may occur with  improper specimen collection / handling, submission of specimen other  than nasopharyngeal swab, presence of viral mutation(s) within the  areas targeted by this assay, and inadequate number of viral copies  (<250 copies / mL). A negative result must be combined with clinical  observations, patient history, and epidemiological information. If result is POSITIVE SARS-CoV-2 target nucleic acids are DETECTED. The SARS-CoV-2 RNA is generally detectable in upper and lower  respiratory specimens dur ing the acute phase of infection.  Positive  results are indicative of active infection with SARS-CoV-2.  Clinical  correlation with patient history and other diagnostic information is  necessary to determine patient infection status.  Positive results do  not rule out bacterial infection or co-infection with other viruses. If result is PRESUMPTIVE POSTIVE  SARS-CoV-2 nucleic acids MAY BE PRESENT.   A presumptive positive result was obtained on the submitted specimen  and confirmed on repeat testing.  While 2019 novel coronavirus  (SARS-CoV-2) nucleic acids may be present in the submitted sample  additional confirmatory testing may be necessary for epidemiological  and / or clinical management purposes  to differentiate between  SARS-CoV-2 and other Sarbecovirus currently known to infect humans.  If clinically indicated additional testing with an alternate test  methodology [phone]) is advised. The SARS-CoV-2 RNA is generally  detectable in upper and lower  respiratory sp ecimens during the acute  phase of infection. The expected result is Negative. Fact Sheet for Patients:  StrictlyIdeas.no Fact Sheet for Healthcare Providers: BankingDealers.co.za This test is not yet approved or cleared by the [place] FDA and has been authorized for detection and/or diagnosis of SARS-CoV-2 by FDA under an Emergency Use Authorization (EUA).  This EUA will remain in effect (meaning this test can be used) for the duration of the COVID-19 declaration under Section 564(b)(1) of the Act, 21 U.S.C. section 360bbb-3(b)(1), unless the authorization is terminated or revoked sooner. Performed at [facility], [place] [address] [place], [place] [address]   Urine culture     Status: Abnormal   Collection Time: 01/24/19  5:05 PM   Specimen: In/Out Cath Urine  Result Value Ref Range Status   Specimen Description IN/OUT CATH URINE  Final   Special Requests   Final    NONE Performed at [facility], [place] [address], [place], [place] [address]    Culture >=100,000 COLONIES/mL ESCHERICHIA COLI (A)  Final   Report Status 01/27/2019 FINAL  Final   Organism ID, Bacteria ESCHERICHIA COLI (A)  Final      Susceptibility   Escherichia coli - MIC*    AMPICILLIN >=32 RESISTANT Resistant     CEFAZOLIN 8 SENSITIVE Sensitive     CEFTRIAXONE <=1 SENSITIVE Sensitive     CIPROFLOXACIN <=0.25 SENSITIVE Sensitive     GENTAMICIN <=1 SENSITIVE Sensitive     IMIPENEM <=0.25 SENSITIVE Sensitive     NITROFURANTOIN <=16 SENSITIVE Sensitive     TRIMETH/SULFA <=20 SENSITIVE Sensitive     AMPICILLIN/SULBACTAM >=32 RESISTANT Resistant     PIP/TAZO 8 SENSITIVE Sensitive     Extended ESBL NEGATIVE Sensitive     * >=100,000 COLONIES/mL ESCHERICHIA COLI  Blood Culture (routine x 2)     Status: None (Preliminary result)   Collection Time: 01/24/19  7:32 PM   Specimen: BLOOD LEFT HAND  Result Value Ref Range Status   Specimen  Description BLOOD LEFT HAND  Final   Special Requests   Final    BOTTLES DRAWN AEROBIC AND ANAEROBIC Blood Culture results may not be optimal due to an inadequate volume of blood received in culture bottles   Culture   Final    NO GROWTH 2 DAYS Performed at [facility] [address], [place], [place] [address]    Report Status PENDING  Incomplete  Blood Culture (routine x 2)     Status: None (Preliminary result)   Collection Time: 01/24/19  7:52 PM   Specimen: BLOOD LEFT ARM  Result Value Ref Range Status   Specimen Description BLOOD LEFT ARM  Final   Special Requests   Final    BOTTLES DRAWN AEROBIC AND ANAEROBIC Blood Culture results may not be optimal due to an inadequate volume of blood received in culture bottles   Culture   Final  NO GROWTH 2 DAYS Performed at [facility], [place] [address], [place], [place] [address]    Report Status PENDING  Incomplete     Labs: BNP (last 3 results) No results for input(s): BNP in the last 8760 hours. Basic Metabolic Panel: Recent Labs  Lab 01/24/19 1443 01/24/19 1617 01/25/19 0256 01/26/19 0242 01/27/19 0452  NA 137 140 141 139 140  K 4.4 4.1 4.6 3.9 4.0  CL 103  --  109 107 106  CO2 23  --  23 24 26   GLUCOSE 93  --  82 97 93  BUN 27*  --  21 21 18   CREATININE 1.39*  --  1.22 1.24 1.13  CALCIUM 9.0  --  8.3* 8.4* 8.8*  MG  --   --   --  1.8 1.9   Liver Function Tests: Recent Labs  Lab 01/24/19 1443 01/25/19 0256 01/26/19 0242  AST 28 34 33  ALT 19 22 21   ALKPHOS 58 54 52  BILITOT 0.9 0.9 0.7  PROT 7.3 6.4* 6.3*  ALBUMIN 3.8 3.1* 2.9*   No results for input(s): LIPASE, AMYLASE in the last 168 hours. No results for input(s): AMMONIA in the last 168 hours. CBC: Recent Labs  Lab 01/24/19 1443 01/24/19 1617 01/25/19 0256 01/26/19 0242 01/27/19 0452  WBC 5.0  --  15.2* 8.1 4.1  NEUTROABS 4.4  --   --  5.7 2.1  HGB 12.0* 11.6* 11.2* 10.1* 11.0*  HCT 36.1* 34.0* 33.9* 30.5* 32.2*  MCV 87.4  --   90.2 88.2 86.6  PLT 128*  --  134* 126* 132*   Cardiac Enzymes: Recent Labs  Lab 01/24/19 1443  CKTOTAL 153   BNP: Invalid input(s): POCBNP CBG: No results for input(s): GLUCAP in the last 168 hours. D-Dimer No results for input(s): DDIMER in the last 72 hours. Hgb A1c No results for input(s): HGBA1C in the last 72 hours. Lipid Profile No results for input(s): CHOL, HDL, LDLCALC, TRIG, CHOLHDL, LDLDIRECT in the last 72 hours. Thyroid function studies No results for input(s): TSH, T4TOTAL, T3FREE, THYROIDAB in the last 72 hours.  Invalid input(s): FREET3 Anemia work up No results for input(s): VITAMINB12, FOLATE, FERRITIN, TIBC, IRON, RETICCTPCT in the last 72 hours. Urinalysis    Component Value Date/Time   COLORURINE YELLOW 01/24/2019 1822   APPEARANCEUR HAZY (A) 01/24/2019 1822   LABSPEC 1.010 01/24/2019 1822   PHURINE 5.0 01/24/2019 1822   GLUCOSEU NEGATIVE 01/24/2019 1822   HGBUR MODERATE (A) 01/24/2019 1822   BILIRUBINUR NEGATIVE 01/24/2019 1822   KETONESUR NEGATIVE 01/24/2019 1822   PROTEINUR NEGATIVE 01/24/2019 1822   UROBILINOGEN 1.0 11/24/2013 2219   NITRITE POSITIVE (A) 01/24/2019 1822   LEUKOCYTESUR LARGE (A) 01/24/2019 1822   Sepsis Labs Invalid input(s): PROCALCITONIN,  WBC,  LACTICIDVEN Microbiology Recent Results (from the past 240 hour(s))  SARS Coronavirus 2 (CEPHEID - Performed in [facility] hospital lab), Hosp Order     Status: None   Collection Time: 01/24/19  4:38 PM   Specimen: Nasopharyngeal Swab  Result Value Ref Range Status   SARS Coronavirus 2 NEGATIVE NEGATIVE Final    Comment: (NOTE) If result is NEGATIVE SARS-CoV-2 target nucleic acids are NOT DETECTED. The SARS-CoV-2 RNA is generally detectable in upper and lower  respiratory specimens during the acute phase of infection. The lowest  concentration of SARS-CoV-2 viral copies this assay can detect is 250  copies / mL. A negative result does not preclude SARS-CoV-2 infection  and  should  not be used as the sole basis for treatment or other  patient management decisions.  A negative result may occur with  improper specimen collection / handling, submission of specimen other  than nasopharyngeal swab, presence of viral mutation(s) within the  areas targeted by this assay, and inadequate number of viral copies  (<250 copies / mL). A negative result must be combined with clinical  observations, patient history, and epidemiological information. If result is POSITIVE SARS-CoV-2 target nucleic acids are DETECTED. The SARS-CoV-2 RNA is generally detectable in upper and lower  respiratory specimens dur ing the acute phase of infection.  Positive  results are indicative of active infection with SARS-CoV-2.  Clinical  correlation with patient history and other diagnostic information is  necessary to determine patient infection status.  Positive results do  not rule out bacterial infection or co-infection with other viruses. If result is PRESUMPTIVE POSTIVE SARS-CoV-2 nucleic acids MAY BE PRESENT.   A presumptive positive result was obtained on the submitted specimen  and confirmed on repeat testing.  While 2019 novel coronavirus  (SARS-CoV-2) nucleic acids may be present in the submitted sample  additional confirmatory testing may be necessary for epidemiological  and / or clinical management purposes  to differentiate between  SARS-CoV-2 and other Sarbecovirus currently known to infect humans.  If clinically indicated additional testing with an alternate test  methodology [phone]) is advised. The SARS-CoV-2 RNA is generally  detectable in upper and lower respiratory sp ecimens during the acute  phase of infection. The expected result is Negative. Fact Sheet for Patients:  StrictlyIdeas.no Fact Sheet for Healthcare Providers: BankingDealers.co.za This test is not yet approved or cleared by the [place] FDA and has been  authorized for detection and/or diagnosis of SARS-CoV-2 by FDA under an Emergency Use Authorization (EUA).  This EUA will remain in effect (meaning this test can be used) for the duration of the COVID-19 declaration under Section 564(b)(1) of the Act, 21 U.S.C. section 360bbb-3(b)(1), unless the authorization is terminated or revoked sooner. Performed at [facility], [place] [address], [place], [place] [address]   Urine culture     Status: Abnormal   Collection Time: 01/24/19  5:05 PM   Specimen: In/Out Cath Urine  Result Value Ref Range Status   Specimen Description IN/OUT CATH URINE  Final   Special Requests   Final    NONE Performed at [facility], [place] [address] [place], [place] [address]    Culture >=100,000 COLONIES/mL ESCHERICHIA COLI (A)  Final   Report Status 01/27/2019 FINAL  Final   Organism ID, Bacteria ESCHERICHIA COLI (A)  Final      Susceptibility   Escherichia coli - MIC*    AMPICILLIN >=32 RESISTANT Resistant     CEFAZOLIN 8 SENSITIVE Sensitive     CEFTRIAXONE <=1 SENSITIVE Sensitive     CIPROFLOXACIN <=0.25 SENSITIVE Sensitive     GENTAMICIN <=1 SENSITIVE Sensitive     IMIPENEM <=0.25 SENSITIVE Sensitive     NITROFURANTOIN <=16 SENSITIVE Sensitive     TRIMETH/SULFA <=20 SENSITIVE Sensitive     AMPICILLIN/SULBACTAM >=32 RESISTANT Resistant     PIP/TAZO 8 SENSITIVE Sensitive     Extended ESBL NEGATIVE Sensitive     * >=100,000 COLONIES/mL ESCHERICHIA COLI  Blood Culture (routine x 2)     Status: None (Preliminary result)   Collection Time: 01/24/19  7:32 PM   Specimen: BLOOD LEFT HAND  Result Value Ref Range Status   Specimen Description BLOOD LEFT  HAND  Final   Special Requests   Final    BOTTLES DRAWN AEROBIC AND ANAEROBIC Blood Culture results may not be optimal due to an inadequate volume of blood received in culture bottles   Culture   Final    NO GROWTH 2 DAYS Performed at [facility], [place] [address], [place], [place] [address]     Report Status PENDING  Incomplete  Blood Culture (routine x 2)     Status: None (Preliminary result)   Collection Time: 01/24/19  7:52 PM   Specimen: BLOOD LEFT ARM  Result Value Ref Range Status   Specimen Description BLOOD LEFT ARM  Final   Special Requests   Final    BOTTLES DRAWN AEROBIC AND ANAEROBIC Blood Culture results may not be optimal due to an inadequate volume of blood received in culture bottles   Culture   Final    NO GROWTH 2 DAYS Performed at [facility], [place] [address], [place], [place] [address]    Report Status PENDING  Incomplete     Time coordinating discharge: 35 minutes  SIGNED:   Aline August, MD  [facility] 01/27/2019, 10:01 AM

## 2019-01-27 NOTE — Progress Notes (Signed)
Pt discharging home with family. PIV removed. Discharge instructions reviewed with patient and questions answered at this time. All belongings with patient.

## 2019-01-29 LAB — CULTURE, BLOOD (ROUTINE X 2)
Culture: NO GROWTH
Culture: NO GROWTH

## 2019-02-01 DIAGNOSIS — Z8619 Personal history of other infectious and parasitic diseases: Secondary | ICD-10-CM | POA: Diagnosis not present

## 2019-02-01 DIAGNOSIS — R899 Unspecified abnormal finding in specimens from other organs, systems and tissues: Secondary | ICD-10-CM | POA: Diagnosis not present

## 2019-02-09 ENCOUNTER — Other Ambulatory Visit: Payer: Self-pay | Admitting: Cardiology

## 2019-02-09 MED ORDER — ATORVASTATIN CALCIUM 40 MG PO TABS: 40.0000 mg | ORAL_TABLET | Freq: Every day | ORAL | 1 refills | Status: DC

## 2019-02-09 NOTE — Telephone Encounter (Signed)
°*  STAT* If patient is at the pharmacy, call can be transferred to refill team.   1. Which medications need to be refilled? (please list name of each medication and dose if known) Atorvastatin 40mg   2. Which pharmacy/location (including street and city if local pharmacy) is medication to be sent to?[facility] [phone]  3. Do they need a 30 day or 90 day supply? [place]

## 2019-02-09 NOTE — Telephone Encounter (Signed)
Rx for atorvastatinn 40 mg one tablet daily sent to [facility] as requested.

## 2019-02-18 ENCOUNTER — Other Ambulatory Visit: Payer: Self-pay | Admitting: Cardiology

## 2019-02-20 DIAGNOSIS — M109 Gout, unspecified: Secondary | ICD-10-CM | POA: Diagnosis not present

## 2019-03-01 NOTE — Progress Notes (Signed)
Cardiology Office Note:    Date:  03/02/2019   ID:  Steven [name], DOB 09-Oct-1934, MRN [id number]  PCP:  Hulan Fess, MD  Cardiologist:  Shirlee More, MD    Referring MD: Hulan Fess, MD    ASSESSMENT:    1. Coronary artery disease of native artery of native heart with stable angina pectoris ([place])   2. Hypertensive heart disease without CHF   3. Hyperlipidemia, unspecified hyperlipidemia type   4. Nonrheumatic aortic valve stenosis   5. Atrial flutter, unspecified type ([place])    PLAN:    In order of problems listed above:  1. Stable CAD more than a year remote from PCI and stent not having angina New York Heart Association class I at this time I would not do an ischemia evaluation will drop aspirin and clopidogrel beta-blocker and his high intensity statin. 2. Stable BP at target continue current treatment recheck creatinine potassium 3. Continue statin check liver function safety lipid profile efficacy 4. Mild aortic stenosis. 5. Stable no recurrence presently not on anticoagulant or antiarrhythmic drug   Next appointment: 6 months   Medication Adjustments/Labs and Tests Ordered: Current medicines are reviewed at length with the patient today.  Concerns regarding medicines are outlined above.  No orders of the defined types were placed in this encounter.  No orders of the defined types were placed in this encounter.   No chief complaint on file.   History of Present Illness:    Steven [name] is a 84 y.o. male with a hx of CAD with PCI and stent left anterior descending coronary artery 02/24/2018, mild aortic valve stenosisAo valve area 1.7 cm 2.  Mean gradient 26 mm Hg. , atrial flutter with EP ablation in September of  2017, hypertension, hyperlipidemia and obesity last seen 10/10/2018. Compliance with diet, lifestyle and medications: Yes  He remains employed active just tells me that at times he has fatigue.  No angina dyspnea or palpitation.  He is a year out  from his PCI and stent or a drop aspirin remain on clopidogrel with his CKD recheck renal function potassium today as well as a lipid profile.  His physical exam is unremarkable and I do not think he requires a repeat echocardiogram at this time I would likely wait until next summer. Past Medical History:  Diagnosis Date   Atrial flutter ([place])    CAD (coronary artery disease)    02/24/18 PCI/DES x1 to the pLAD   Carpal tunnel syndrome    Cataract    left eye   Chest pain    CKD (chronic kidney disease), stage III (HCC)    Enlarged prostate    History of gout    History of kidney stones    pt has one now but not giving him any problems   Numbness    both hands pt states pinched. 12-17-14 Gabapentin has improved.   Pneumonia    history of pneumonia   Psoriasis     Past Surgical History:  Procedure Laterality Date   CATARACT EXTRACTION W/PHACO Left 03/15/2013   Procedure: CATARACT EXTRACTION PHACO AND INTRAOCULAR LENS PLACEMENT (IOC);  Surgeon: Adonis Brook, MD;  Location: [facility];  Service: Ophthalmology;  Laterality: Left;   COLONOSCOPY     COLONOSCOPY WITH PROPOFOL N/A 12/24/2014   Procedure: COLONOSCOPY WITH PROPOFOL;  Surgeon: Garlan Fair, MD;  [facility];  Service: Endoscopy;  Laterality: N/A;   CORONARY STENT INTERVENTION N/A 02/24/2018   Procedure: CORONARY STENT INTERVENTION;  Surgeon: Jettie Booze, MD;  Location: [facility];  Service: Cardiovascular;  Laterality: N/A;   ELECTROPHYSIOLOGIC STUDY N/A 02/28/2016   Procedure: A-Flutter Ablation;  Surgeon: Deboraha Sprang, MD;  Location: [facility];  Service: Cardiovascular;  Laterality: N/A;   right cataract removed      RIGHT/LEFT HEART CATH AND CORONARY ANGIOGRAPHY N/A 02/24/2018   Procedure: RIGHT/LEFT HEART CATH AND CORONARY ANGIOGRAPHY;  Surgeon: Jettie Booze, MD;  Location: [facility];  Service: Cardiovascular;  Laterality: N/A;   TONSILLECTOMY     as  child    Current Medications: Current Meds  Medication Sig   aspirin EC 81 MG tablet Take 1 tablet (81 mg total) by mouth daily.   atorvastatin (LIPITOR) 40 MG tablet Take 1 tablet (40 mg total) by mouth daily.   clopidogrel (PLAVIX) 75 MG tablet TAKE 1 TABLET BY MOUTH EVERY DAY   Cyanocobalamin (VITAMIN B-12 PO) Take 1 tablet by mouth daily.   indomethacin (INDOCIN) 25 MG capsule TAKE 1 2 CAPSULES WITH FOOD OR MILK EVERY 6 8 HOURS AS NEEDED FOR GOUT   metoprolol succinate (TOPROL XL) 25 MG 24 hr tablet Take 25 mg by mouth daily.   nitroGLYCERIN (NITROSTAT) 0.4 MG SL tablet Place 1 tablet (0.4 mg total) under the tongue every 5 (five) minutes as needed.   Omega-3 Fatty Acids (FISH OIL) 1000 MG CAPS Take 1,000 mg by mouth daily.     Allergies:   Patient has no known allergies.   Social History   Socioeconomic History   Marital status: Widowed    Spouse name: Not on file   Number of children: Not on file   Years of education: Not on file   Highest education level: Not on file  Occupational History   Not on file  Social Needs   Financial resource strain: Not on file   Food insecurity    Worry: Not on file    Inability: Not on file   Transportation needs    Medical: Not on file    Non-medical: Not on file  Tobacco Use   Smoking status: Former Smoker    Types: Cigars   Smokeless tobacco: Former Systems developer    Types: Chew   Tobacco comment: smoked cigars 49yrs ago  Substance and Sexual Activity   Alcohol use: No   Drug use: No   Sexual activity: Never  Lifestyle   Physical activity    Days per week: Not on file    Minutes per session: Not on file   Stress: Not on file  Relationships   Social connections    Talks on phone: Not on file    Gets together: Not on file    Attends religious service: Not on file    Active member of club or organization: Not on file    Attends meetings of clubs or organizations: Not on file    Relationship status: Not on  file  Other Topics Concern   Not on file  Social History Narrative   Not on file     Family History: The patient's family history includes Diabetes in his son; Healthy in his father; Kidney failure in his sister and sister; Other in his mother. There is no history of Heart attack. ROS:   Please see the history of present illness.    All other systems reviewed and are negative.  EKGs/Labs/Other Studies Reviewed:    The following studies were reviewed today:  EKG:  EKG  ordered today and personally reviewed.  The ekg ordered today demonstrates sinus rhythm normal  Recent Labs: 01/26/2019: ALT 21 01/27/2019: BUN 18; Creatinine, Ser 1.13; Hemoglobin 11.0; Magnesium 1.9; Platelets 132; Potassium 4.0; Sodium 140  Recent Lipid Panel    Component Value Date/Time   CHOL 113 04/27/2018 1120   TRIG 64 04/27/2018 1120   HDL 46 04/27/2018 1120   CHOLHDL 2.5 04/27/2018 1120   CHOLHDL 3.2 01/11/2018 0701   VLDL 17 01/11/2018 0701   LDLCALC 54 04/27/2018 1120    Physical Exam:    VS:  BP (!) 146/68 (BP Location: Right Arm, Patient Position: Sitting, Cuff Size: Large)    Pulse (!) 57    Ht 6\' 1"  (1.854 m)    Wt 219 lb 12.8 oz (99.7 kg)    SpO2 97%    BMI 29.00 kg/m     Wt Readings from Last 3 Encounters:  03/02/19 219 lb 12.8 oz (99.7 kg)  11/25/18 219 lb 12.8 oz (99.7 kg)  10/10/18 216 lb 6.4 oz (98.2 kg)     GEN:  Well nourished, well developed in no acute distress HEENT: Normal NECK: No JVD; No carotid bruits LYMPHATICS: No lymphadenopathy CARDIAC: RRR, no murmurs, rubs, gallops RESPIRATORY:  Clear to auscultation without rales, wheezing or rhonchi  ABDOMEN: Soft, non-tender, non-distended MUSCULOSKELETAL:  No edema; No deformity  SKIN: Warm and dry NEUROLOGIC:  Alert and oriented x 3 PSYCHIATRIC:  Normal affect    Signed, Shirlee More, MD  03/02/2019 9:24 AM    [facility]

## 2019-03-02 ENCOUNTER — Other Ambulatory Visit: Payer: Self-pay

## 2019-03-02 ENCOUNTER — Encounter: Payer: Self-pay | Admitting: Cardiology

## 2019-03-02 ENCOUNTER — Ambulatory Visit (INDEPENDENT_AMBULATORY_CARE_PROVIDER_SITE_OTHER): Payer: Medicare HMO | Admitting: Cardiology

## 2019-03-02 VITALS — BP 146/68 | HR 57 | Ht 73.0 in | Wt 219.8 lb

## 2019-03-02 DIAGNOSIS — I25118 Atherosclerotic heart disease of native coronary artery with other forms of angina pectoris: Secondary | ICD-10-CM

## 2019-03-02 DIAGNOSIS — E785 Hyperlipidemia, unspecified: Secondary | ICD-10-CM | POA: Diagnosis not present

## 2019-03-02 DIAGNOSIS — I119 Hypertensive heart disease without heart failure: Secondary | ICD-10-CM | POA: Diagnosis not present

## 2019-03-02 DIAGNOSIS — I4892 Unspecified atrial flutter: Secondary | ICD-10-CM

## 2019-03-02 DIAGNOSIS — I350 Nonrheumatic aortic (valve) stenosis: Secondary | ICD-10-CM

## 2019-03-02 NOTE — Patient Instructions (Signed)
Medication Instructions:  Your physician has recommended you make the following change in your medication:   STOP aspirin  If you need a refill on your cardiac medications before your next appointment, please call your pharmacy.   Lab work: Your physician recommends that you return for lab work today: CMP, lipid panel.   If you have labs (blood work) drawn today and your tests are completely normal, you will receive your results only by: Marland Kitchen MyChart Message (if you have MyChart) OR . A paper copy in the mail If you have any lab test that is abnormal or we need to change your treatment, we will call you to review the results.  Testing/Procedures: You had an EKG today.   Follow-Up: At [facility] you and your health needs are our priority.  As part of our continuing mission to provide you with exceptional heart care, we have created designated Provider Care Teams.  These Care Teams include your primary Cardiologist (physician) and Advanced Practice Providers (APPs -  Physician Assistants and Nurse Practitioners) who all work together to provide you with the care you need, when you need it. . You will need a follow up appointment in 6 months.  Please call our office 2 months in advance to schedule this appointment.

## 2019-03-03 LAB — COMPREHENSIVE METABOLIC PANEL
ALT: 15 IU/L (ref 0–44)
AST: 19 IU/L (ref 0–40)
Albumin/Globulin Ratio: 1.2 (ref 1.2–2.2)
Albumin: 4 g/dL (ref 3.6–4.6)
Alkaline Phosphatase: 64 IU/L (ref 39–117)
BUN/Creatinine Ratio: 16 (ref 10–24)
BUN: 22 mg/dL (ref 8–27)
Bilirubin Total: 0.3 mg/dL (ref 0.0–1.2)
CO2: 24 mmol/L (ref 20–29)
Calcium: 9.5 mg/dL (ref 8.6–10.2)
Chloride: 106 mmol/L (ref 96–106)
Creatinine, Ser: 1.36 mg/dL — ABNORMAL HIGH (ref 0.76–1.27)
GFR calc Af Amer: 56 mL/min/{1.73_m2} — ABNORMAL LOW (ref >59)
GFR calc non Af Amer: 48 mL/min/{1.73_m2} — ABNORMAL LOW (ref >59)
Globulin, Total: 3.3 g/dL (ref 1.5–4.5)
Glucose: 98 mg/dL (ref 65–99)
Potassium: 4.7 mmol/L (ref 3.5–5.2)
Sodium: 143 mmol/L (ref 134–144)
Total Protein: 7.3 g/dL (ref 6.0–8.5)

## 2019-03-03 LAB — LIPID PANEL
Chol/HDL Ratio: 2.4 ratio (ref 0.0–5.0)
Cholesterol, Total: 119 mg/dL (ref 100–199)
HDL: 50 mg/dL (ref >39)
LDL Chol Calc (NIH): 56 mg/dL (ref 0–99)
Triglycerides: 56 mg/dL (ref 0–149)
VLDL Cholesterol Cal: 13 mg/dL (ref 5–40)

## 2019-03-14 ENCOUNTER — Other Ambulatory Visit: Payer: Self-pay | Admitting: Cardiology

## 2019-03-15 MED ORDER — METOPROLOL SUCCINATE ER 25 MG PO TB24: 25.0000 mg | ORAL_TABLET | Freq: Every day | ORAL | 1 refills | Status: DC

## 2019-03-15 MED ORDER — ATORVASTATIN CALCIUM 40 MG PO TABS: 40.0000 mg | ORAL_TABLET | Freq: Every day | ORAL | 5 refills | Status: DC

## 2019-03-15 MED ORDER — CLOPIDOGREL BISULFATE 75 MG PO TABS: 75.0000 mg | ORAL_TABLET | Freq: Every day | ORAL | 1 refills | Status: DC

## 2019-03-15 MED ORDER — NITROGLYCERIN 0.4 MG SL SUBL: 0.4000 mg | SUBLINGUAL_TABLET | SUBLINGUAL | 2 refills | Status: DC | PRN

## 2019-03-23 DIAGNOSIS — D649 Anemia, unspecified: Secondary | ICD-10-CM | POA: Diagnosis not present

## 2019-03-23 DIAGNOSIS — I2510 Atherosclerotic heart disease of native coronary artery without angina pectoris: Secondary | ICD-10-CM | POA: Diagnosis not present

## 2019-03-23 DIAGNOSIS — N401 Enlarged prostate with lower urinary tract symptoms: Secondary | ICD-10-CM | POA: Diagnosis not present

## 2019-03-23 DIAGNOSIS — I10 Essential (primary) hypertension: Secondary | ICD-10-CM | POA: Diagnosis not present

## 2019-05-04 DIAGNOSIS — I2510 Atherosclerotic heart disease of native coronary artery without angina pectoris: Secondary | ICD-10-CM | POA: Diagnosis not present

## 2019-05-04 DIAGNOSIS — I10 Essential (primary) hypertension: Secondary | ICD-10-CM | POA: Diagnosis not present

## 2019-05-04 DIAGNOSIS — D649 Anemia, unspecified: Secondary | ICD-10-CM | POA: Diagnosis not present

## 2019-05-14 ENCOUNTER — Other Ambulatory Visit: Payer: Self-pay | Admitting: Cardiology

## 2019-05-16 DIAGNOSIS — R399 Unspecified symptoms and signs involving the genitourinary system: Secondary | ICD-10-CM | POA: Diagnosis not present

## 2019-06-09 DIAGNOSIS — N410 Acute prostatitis: Secondary | ICD-10-CM | POA: Diagnosis not present

## 2019-06-15 ENCOUNTER — Telehealth: Payer: Self-pay | Admitting: Hematology and Oncology

## 2019-06-15 NOTE — Telephone Encounter (Signed)
[facility] transfer to [place]. Confirmed January appointment with patient.  °

## 2019-06-22 ENCOUNTER — Encounter (HOSPITAL_BASED_OUTPATIENT_CLINIC_OR_DEPARTMENT_OTHER): Payer: Self-pay | Admitting: Emergency Medicine

## 2019-06-22 ENCOUNTER — Emergency Department (HOSPITAL_BASED_OUTPATIENT_CLINIC_OR_DEPARTMENT_OTHER): Payer: Medicare HMO

## 2019-06-22 ENCOUNTER — Emergency Department (HOSPITAL_BASED_OUTPATIENT_CLINIC_OR_DEPARTMENT_OTHER)
Admission: EM | Admit: 2019-06-22 | Discharge: 2019-06-22 | Disposition: A | Discharge status: Home / Self Care | Payer: Medicare HMO | Attending: Emergency Medicine | Admitting: Emergency Medicine

## 2019-06-22 ENCOUNTER — Other Ambulatory Visit: Payer: Self-pay

## 2019-06-22 DIAGNOSIS — Y9389 Activity, other specified: Secondary | ICD-10-CM | POA: Insufficient documentation

## 2019-06-22 DIAGNOSIS — Y999 Unspecified external cause status: Secondary | ICD-10-CM | POA: Insufficient documentation

## 2019-06-22 DIAGNOSIS — I2510 Atherosclerotic heart disease of native coronary artery without angina pectoris: Secondary | ICD-10-CM | POA: Diagnosis not present

## 2019-06-22 DIAGNOSIS — Y92410 Unspecified street and highway as the place of occurrence of the external cause: Secondary | ICD-10-CM | POA: Insufficient documentation

## 2019-06-22 DIAGNOSIS — E785 Hyperlipidemia, unspecified: Secondary | ICD-10-CM | POA: Diagnosis not present

## 2019-06-22 DIAGNOSIS — M542 Cervicalgia: Secondary | ICD-10-CM | POA: Diagnosis not present

## 2019-06-22 DIAGNOSIS — Z87891 Personal history of nicotine dependence: Secondary | ICD-10-CM | POA: Diagnosis not present

## 2019-06-22 DIAGNOSIS — Z7902 Long term (current) use of antithrombotics/antiplatelets: Secondary | ICD-10-CM | POA: Diagnosis not present

## 2019-06-22 DIAGNOSIS — I1310 Hypertensive heart and chronic kidney disease without heart failure, with stage 1 through stage 4 chronic kidney disease, or unspecified chronic kidney disease: Secondary | ICD-10-CM | POA: Diagnosis not present

## 2019-06-22 DIAGNOSIS — Z79899 Other long term (current) drug therapy: Secondary | ICD-10-CM | POA: Diagnosis not present

## 2019-06-22 DIAGNOSIS — R52 Pain, unspecified: Secondary | ICD-10-CM | POA: Diagnosis not present

## 2019-06-22 DIAGNOSIS — S161XXA Strain of muscle, fascia and tendon at neck level, initial encounter: Secondary | ICD-10-CM | POA: Diagnosis not present

## 2019-06-22 DIAGNOSIS — S199XXA Unspecified injury of neck, initial encounter: Secondary | ICD-10-CM | POA: Diagnosis present

## 2019-06-22 DIAGNOSIS — N183 Chronic kidney disease, stage 3 unspecified: Secondary | ICD-10-CM | POA: Insufficient documentation

## 2019-06-22 DIAGNOSIS — J80 Acute respiratory distress syndrome: Secondary | ICD-10-CM | POA: Diagnosis not present

## 2019-06-22 MED ORDER — IBUPROFEN 400 MG PO TABS
400.0000 mg | ORAL_TABLET | Freq: Once | ORAL | Status: AC
  Administered 2019-06-22: 400 mg via ORAL
  Filled 2019-06-22: qty 1

## 2019-06-22 MED ORDER — HYDROCODONE-ACETAMINOPHEN 5-325 MG PO TABS
1.0000 | ORAL_TABLET | Freq: Once | ORAL | Status: AC
  Administered 2019-06-22: 22:00:00 1 via ORAL
  Filled 2019-06-22: qty 1

## 2019-06-22 NOTE — ED Notes (Signed)
Patient updated daughter and son on his personal phone.

## 2019-06-22 NOTE — ED Notes (Signed)
ED Provider at bedside. 
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
## 2019-06-22 NOTE — ED Notes (Signed)
Patient verbalizes understanding of discharge instructions. Opportunity for questioning and answers were provided. Armband removed by staff, pt discharged from ED.  

## 2019-06-26 DIAGNOSIS — R4182 Altered mental status, unspecified: Secondary | ICD-10-CM | POA: Diagnosis not present

## 2019-06-27 ENCOUNTER — Other Ambulatory Visit: Payer: Medicare HMO

## 2019-06-27 ENCOUNTER — Ambulatory Visit
Admission: RE | Admit: 2019-06-27 | Discharge: 2019-06-27 | Disposition: A | Discharge status: Home / Self Care | Payer: Medicare HMO | Source: Ambulatory Visit | Attending: Family Medicine | Admitting: Family Medicine

## 2019-06-27 ENCOUNTER — Encounter (HOSPITAL_COMMUNITY): Payer: Self-pay | Admitting: Emergency Medicine

## 2019-06-27 ENCOUNTER — Other Ambulatory Visit: Payer: Self-pay | Admitting: Family Medicine

## 2019-06-27 ENCOUNTER — Emergency Department (HOSPITAL_COMMUNITY): Payer: Medicare HMO

## 2019-06-27 ENCOUNTER — Inpatient Hospital Stay (HOSPITAL_COMMUNITY)
Admission: EM | Admit: 2019-06-27 | Discharge: 2019-06-29 | DRG: 682 | Disposition: A | Discharge status: Home / Self Care | Payer: Medicare HMO | Source: Ambulatory Visit | Attending: Internal Medicine | Admitting: Internal Medicine

## 2019-06-27 ENCOUNTER — Other Ambulatory Visit: Payer: Self-pay

## 2019-06-27 DIAGNOSIS — I129 Hypertensive chronic kidney disease with stage 1 through stage 4 chronic kidney disease, or unspecified chronic kidney disease: Secondary | ICD-10-CM | POA: Diagnosis present

## 2019-06-27 DIAGNOSIS — I4892 Unspecified atrial flutter: Secondary | ICD-10-CM | POA: Diagnosis present

## 2019-06-27 DIAGNOSIS — M47812 Spondylosis without myelopathy or radiculopathy, cervical region: Secondary | ICD-10-CM | POA: Diagnosis present

## 2019-06-27 DIAGNOSIS — Z955 Presence of coronary angioplasty implant and graft: Secondary | ICD-10-CM

## 2019-06-27 DIAGNOSIS — E875 Hyperkalemia: Secondary | ICD-10-CM | POA: Diagnosis present

## 2019-06-27 DIAGNOSIS — Z87442 Personal history of urinary calculi: Secondary | ICD-10-CM

## 2019-06-27 DIAGNOSIS — N179 Acute kidney failure, unspecified: Secondary | ICD-10-CM | POA: Diagnosis present

## 2019-06-27 DIAGNOSIS — Z841 Family history of disorders of kidney and ureter: Secondary | ICD-10-CM

## 2019-06-27 DIAGNOSIS — U071 COVID-19: Secondary | ICD-10-CM

## 2019-06-27 DIAGNOSIS — E785 Hyperlipidemia, unspecified: Secondary | ICD-10-CM | POA: Diagnosis present

## 2019-06-27 DIAGNOSIS — Z87891 Personal history of nicotine dependence: Secondary | ICD-10-CM | POA: Diagnosis not present

## 2019-06-27 DIAGNOSIS — I25118 Atherosclerotic heart disease of native coronary artery with other forms of angina pectoris: Secondary | ICD-10-CM | POA: Diagnosis not present

## 2019-06-27 DIAGNOSIS — N189 Chronic kidney disease, unspecified: Secondary | ICD-10-CM | POA: Diagnosis not present

## 2019-06-27 DIAGNOSIS — R4182 Altered mental status, unspecified: Secondary | ICD-10-CM

## 2019-06-27 DIAGNOSIS — R519 Headache, unspecified: Secondary | ICD-10-CM | POA: Diagnosis not present

## 2019-06-27 DIAGNOSIS — I10 Essential (primary) hypertension: Secondary | ICD-10-CM | POA: Diagnosis not present

## 2019-06-27 DIAGNOSIS — S3991XA Unspecified injury of abdomen, initial encounter: Secondary | ICD-10-CM | POA: Diagnosis not present

## 2019-06-27 DIAGNOSIS — Z7902 Long term (current) use of antithrombotics/antiplatelets: Secondary | ICD-10-CM

## 2019-06-27 DIAGNOSIS — Z8701 Personal history of pneumonia (recurrent): Secondary | ICD-10-CM | POA: Diagnosis not present

## 2019-06-27 DIAGNOSIS — R7612 Nonspecific reaction to cell mediated immunity measurement of gamma interferon antigen response without active tuberculosis: Secondary | ICD-10-CM | POA: Diagnosis not present

## 2019-06-27 DIAGNOSIS — R9431 Abnormal electrocardiogram [ECG] [EKG]: Secondary | ICD-10-CM | POA: Diagnosis not present

## 2019-06-27 DIAGNOSIS — N400 Enlarged prostate without lower urinary tract symptoms: Secondary | ICD-10-CM | POA: Diagnosis present

## 2019-06-27 DIAGNOSIS — Z833 Family history of diabetes mellitus: Secondary | ICD-10-CM | POA: Diagnosis not present

## 2019-06-27 DIAGNOSIS — N1832 Chronic kidney disease, stage 3b: Secondary | ICD-10-CM | POA: Diagnosis present

## 2019-06-27 DIAGNOSIS — R109 Unspecified abdominal pain: Secondary | ICD-10-CM | POA: Diagnosis not present

## 2019-06-27 DIAGNOSIS — K567 Ileus, unspecified: Secondary | ICD-10-CM | POA: Diagnosis present

## 2019-06-27 DIAGNOSIS — R1084 Generalized abdominal pain: Secondary | ICD-10-CM | POA: Diagnosis not present

## 2019-06-27 DIAGNOSIS — Z79899 Other long term (current) drug therapy: Secondary | ICD-10-CM

## 2019-06-27 DIAGNOSIS — D649 Anemia, unspecified: Secondary | ICD-10-CM | POA: Diagnosis not present

## 2019-06-27 DIAGNOSIS — I2510 Atherosclerotic heart disease of native coronary artery without angina pectoris: Secondary | ICD-10-CM | POA: Diagnosis present

## 2019-06-27 DIAGNOSIS — Z791 Long term (current) use of non-steroidal anti-inflammatories (NSAID): Secondary | ICD-10-CM | POA: Diagnosis not present

## 2019-06-27 HISTORY — DX: Chronic kidney disease, unspecified: N18.9

## 2019-06-27 HISTORY — DX: Ileus, unspecified: K56.7

## 2019-06-27 HISTORY — DX: Acute kidney failure, unspecified: N17.9

## 2019-06-27 HISTORY — DX: Hyperkalemia: E87.5

## 2019-06-27 LAB — COMPREHENSIVE METABOLIC PANEL
ALT: 19 U/L (ref 0–44)
AST: 31 U/L (ref 15–41)
Albumin: 4 g/dL (ref 3.5–5.0)
Alkaline Phosphatase: 70 U/L (ref 38–126)
Anion gap: 7 (ref 5–15)
BUN: 38 mg/dL — ABNORMAL HIGH (ref 8–23)
CO2: 23 mmol/L (ref 22–32)
Calcium: 9.4 mg/dL (ref 8.9–10.3)
Chloride: 109 mmol/L (ref 98–111)
Creatinine, Ser: 2.81 mg/dL — ABNORMAL HIGH (ref 0.61–1.24)
GFR calc Af Amer: 23 mL/min — ABNORMAL LOW (ref >60)
GFR calc non Af Amer: 20 mL/min — ABNORMAL LOW (ref >60)
Glucose, Bld: 94 mg/dL (ref 70–99)
Potassium: 6.8 mmol/L (ref 3.5–5.1)
Sodium: 139 mmol/L (ref 135–145)
Total Bilirubin: 1.1 mg/dL (ref 0.3–1.2)
Total Protein: 8.6 g/dL — ABNORMAL HIGH (ref 6.5–8.1)

## 2019-06-27 LAB — CBC
HCT: 35.8 % — ABNORMAL LOW (ref 39.0–52.0)
Hemoglobin: 11.3 g/dL — ABNORMAL LOW (ref 13.0–17.0)
MCH: 28.3 pg (ref 26.0–34.0)
MCHC: 31.6 g/dL (ref 30.0–36.0)
MCV: 89.7 fL (ref 80.0–100.0)
Platelets: 173 10*3/uL (ref 150–400)
RBC: 3.99 MIL/uL — ABNORMAL LOW (ref 4.22–5.81)
RDW: 14.4 % (ref 11.5–15.5)
WBC: 3.2 10*3/uL — ABNORMAL LOW (ref 4.0–10.5)
nRBC: 0 % (ref 0.0–0.2)

## 2019-06-27 LAB — URINALYSIS, ROUTINE W REFLEX MICROSCOPIC
Bacteria, UA: NONE SEEN
Bilirubin Urine: NEGATIVE
Glucose, UA: 50 mg/dL — AB
Hgb urine dipstick: NEGATIVE
Ketones, ur: NEGATIVE mg/dL
Leukocytes,Ua: NEGATIVE
Nitrite: NEGATIVE
Protein, ur: 30 mg/dL — AB
Specific Gravity, Urine: 1.017 (ref 1.005–1.030)
pH: 5 (ref 5.0–8.0)

## 2019-06-27 LAB — LIPASE, BLOOD: Lipase: 22 U/L (ref 11–51)

## 2019-06-27 MED ORDER — TAMSULOSIN HCL 0.4 MG PO CAPS
0.4000 mg | ORAL_CAPSULE | Freq: Every day | ORAL | Status: DC
  Administered 2019-06-28 – 2019-06-29 (×2): 0.4 mg via ORAL
  Filled 2019-06-27 (×2): qty 1

## 2019-06-27 MED ORDER — CLOPIDOGREL BISULFATE 75 MG PO TABS
75.0000 mg | ORAL_TABLET | Freq: Every day | ORAL | Status: DC
  Administered 2019-06-28 – 2019-06-29 (×2): 75 mg via ORAL
  Filled 2019-06-27 (×2): qty 1

## 2019-06-27 MED ORDER — SODIUM CHLORIDE 0.9 % IV BOLUS
1000.0000 mL | Freq: Once | INTRAVENOUS | Status: AC
  Administered 2019-06-27: 1000 mL via INTRAVENOUS

## 2019-06-27 MED ORDER — ATORVASTATIN CALCIUM 40 MG PO TABS
40.0000 mg | ORAL_TABLET | Freq: Every day | ORAL | Status: DC
  Administered 2019-06-28 – 2019-06-29 (×2): 40 mg via ORAL
  Filled 2019-06-27 (×2): qty 1

## 2019-06-27 MED ORDER — DEXTROSE 50 % IV SOLN
1.0000 | Freq: Once | INTRAVENOUS | Status: AC
  Administered 2019-06-27: 50 mL via INTRAVENOUS
  Filled 2019-06-27: qty 50

## 2019-06-27 MED ORDER — SODIUM CHLORIDE 0.9 % IV SOLN: INTRAVENOUS | Status: AC

## 2019-06-27 MED ORDER — SODIUM BICARBONATE 8.4 % IV SOLN
50.0000 meq | Freq: Once | INTRAVENOUS | Status: AC
  Administered 2019-06-27: 21:00:00 50 meq via INTRAVENOUS
  Filled 2019-06-27: qty 50

## 2019-06-27 MED ORDER — ONDANSETRON HCL 4 MG/2ML IJ SOLN: 4.0000 mg | Freq: Four times a day (QID) | INTRAMUSCULAR | Status: DC | PRN

## 2019-06-27 MED ORDER — METOPROLOL SUCCINATE ER 25 MG PO TB24
25.0000 mg | ORAL_TABLET | Freq: Every day | ORAL | Status: DC
  Administered 2019-06-28 – 2019-06-29 (×2): 25 mg via ORAL
  Filled 2019-06-27 (×2): qty 1

## 2019-06-27 MED ORDER — ACETAMINOPHEN 325 MG PO TABS: 650.0000 mg | ORAL_TABLET | Freq: Four times a day (QID) | ORAL | Status: DC | PRN

## 2019-06-27 MED ORDER — HEPARIN SODIUM (PORCINE) 5000 UNIT/ML IJ SOLN
5000.0000 [IU] | Freq: Three times a day (TID) | INTRAMUSCULAR | Status: DC
  Administered 2019-06-27 – 2019-06-29 (×5): 5000 [IU] via SUBCUTANEOUS
  Filled 2019-06-27 (×5): qty 1

## 2019-06-27 MED ORDER — INSULIN ASPART 100 UNIT/ML IV SOLN
5.0000 [IU] | Freq: Once | INTRAVENOUS | Status: AC
  Administered 2019-06-27: 5 [IU] via INTRAVENOUS
  Filled 2019-06-27: qty 0.05

## 2019-06-27 MED ORDER — ACETAMINOPHEN 650 MG RE SUPP: 650.0000 mg | Freq: Four times a day (QID) | RECTAL | Status: DC | PRN

## 2019-06-27 MED ORDER — ONDANSETRON HCL 4 MG PO TABS: 4.0000 mg | ORAL_TABLET | Freq: Four times a day (QID) | ORAL | Status: DC | PRN

## 2019-06-27 MED ORDER — SODIUM CHLORIDE 0.9% FLUSH
3.0000 mL | Freq: Two times a day (BID) | INTRAVENOUS | Status: DC
  Administered 2019-06-27 – 2019-06-29 (×4): 3 mL via INTRAVENOUS

## 2019-06-27 MED ORDER — ALBUTEROL SULFATE (2.5 MG/3ML) 0.083% IN NEBU
5.0000 mg | INHALATION_SOLUTION | Freq: Once | RESPIRATORY_TRACT | Status: AC
  Administered 2019-06-27: 5 mg via RESPIRATORY_TRACT
  Filled 2019-06-27: qty 6

## 2019-06-27 MED ORDER — SODIUM ZIRCONIUM CYCLOSILICATE 5 G PO PACK
5.0000 g | PACK | Freq: Once | ORAL | Status: AC
  Administered 2019-06-27: 5 g via ORAL
  Filled 2019-06-27: qty 1
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
## 2019-06-27 NOTE — ED Notes (Signed)
ED Provider at bedside. 

## 2019-06-27 NOTE — ED Triage Notes (Signed)
Patient reports sent from PCP for evaluation of high potassium. States blood was drawn yesterday. C/o abdominal pain. Denies N/V/D.
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
## 2019-06-27 NOTE — ED Notes (Addendum)
Date and time results received: 06/27/19 3:37 PM  Test: Potassium Critical Value: 6.8  Name of Provider Notified: Tyrone Nine  Orders Received? Or Actions Taken?:

## 2019-06-28 ENCOUNTER — Observation Stay (HOSPITAL_COMMUNITY): Payer: Medicare HMO

## 2019-06-28 DIAGNOSIS — I129 Hypertensive chronic kidney disease with stage 1 through stage 4 chronic kidney disease, or unspecified chronic kidney disease: Secondary | ICD-10-CM | POA: Diagnosis present

## 2019-06-28 DIAGNOSIS — N1832 Chronic kidney disease, stage 3b: Secondary | ICD-10-CM | POA: Diagnosis present

## 2019-06-28 DIAGNOSIS — I10 Essential (primary) hypertension: Secondary | ICD-10-CM | POA: Diagnosis not present

## 2019-06-28 DIAGNOSIS — R7612 Nonspecific reaction to cell mediated immunity measurement of gamma interferon antigen response without active tuberculosis: Secondary | ICD-10-CM | POA: Diagnosis not present

## 2019-06-28 DIAGNOSIS — Z7902 Long term (current) use of antithrombotics/antiplatelets: Secondary | ICD-10-CM | POA: Diagnosis not present

## 2019-06-28 DIAGNOSIS — E785 Hyperlipidemia, unspecified: Secondary | ICD-10-CM | POA: Diagnosis present

## 2019-06-28 DIAGNOSIS — I4892 Unspecified atrial flutter: Secondary | ICD-10-CM | POA: Diagnosis present

## 2019-06-28 DIAGNOSIS — Z955 Presence of coronary angioplasty implant and graft: Secondary | ICD-10-CM | POA: Diagnosis not present

## 2019-06-28 DIAGNOSIS — N400 Enlarged prostate without lower urinary tract symptoms: Secondary | ICD-10-CM | POA: Diagnosis present

## 2019-06-28 DIAGNOSIS — N179 Acute kidney failure, unspecified: Principal | ICD-10-CM

## 2019-06-28 DIAGNOSIS — Z791 Long term (current) use of non-steroidal anti-inflammatories (NSAID): Secondary | ICD-10-CM | POA: Diagnosis not present

## 2019-06-28 DIAGNOSIS — D649 Anemia, unspecified: Secondary | ICD-10-CM | POA: Diagnosis not present

## 2019-06-28 DIAGNOSIS — I2510 Atherosclerotic heart disease of native coronary artery without angina pectoris: Secondary | ICD-10-CM | POA: Diagnosis present

## 2019-06-28 DIAGNOSIS — U071 COVID-19: Secondary | ICD-10-CM

## 2019-06-28 DIAGNOSIS — Z87891 Personal history of nicotine dependence: Secondary | ICD-10-CM | POA: Diagnosis not present

## 2019-06-28 DIAGNOSIS — E875 Hyperkalemia: Secondary | ICD-10-CM | POA: Diagnosis present

## 2019-06-28 DIAGNOSIS — Z87442 Personal history of urinary calculi: Secondary | ICD-10-CM | POA: Diagnosis not present

## 2019-06-28 DIAGNOSIS — K567 Ileus, unspecified: Secondary | ICD-10-CM | POA: Diagnosis present

## 2019-06-28 DIAGNOSIS — N189 Chronic kidney disease, unspecified: Secondary | ICD-10-CM

## 2019-06-28 DIAGNOSIS — Z79899 Other long term (current) drug therapy: Secondary | ICD-10-CM | POA: Diagnosis not present

## 2019-06-28 DIAGNOSIS — I25118 Atherosclerotic heart disease of native coronary artery with other forms of angina pectoris: Secondary | ICD-10-CM | POA: Diagnosis not present

## 2019-06-28 DIAGNOSIS — M47812 Spondylosis without myelopathy or radiculopathy, cervical region: Secondary | ICD-10-CM | POA: Diagnosis present

## 2019-06-28 DIAGNOSIS — Z8701 Personal history of pneumonia (recurrent): Secondary | ICD-10-CM | POA: Diagnosis not present

## 2019-06-28 DIAGNOSIS — Z833 Family history of diabetes mellitus: Secondary | ICD-10-CM | POA: Diagnosis not present

## 2019-06-28 DIAGNOSIS — Z841 Family history of disorders of kidney and ureter: Secondary | ICD-10-CM | POA: Diagnosis not present

## 2019-06-28 LAB — BASIC METABOLIC PANEL
Anion gap: 7 (ref 5–15)
Anion gap: 9 (ref 5–15)
BUN: 31 mg/dL — ABNORMAL HIGH (ref 8–23)
BUN: 26 mg/dL — ABNORMAL HIGH (ref 8–23)
CO2: 21 mmol/L — ABNORMAL LOW (ref 22–32)
CO2: 20 mmol/L — ABNORMAL LOW (ref 22–32)
Calcium: 8.5 mg/dL — ABNORMAL LOW (ref 8.9–10.3)
Calcium: 8.8 mg/dL — ABNORMAL LOW (ref 8.9–10.3)
Chloride: 110 mmol/L (ref 98–111)
Chloride: 109 mmol/L (ref 98–111)
Creatinine, Ser: 2.3 mg/dL — ABNORMAL HIGH (ref 0.61–1.24)
Creatinine, Ser: 1.89 mg/dL — ABNORMAL HIGH (ref 0.61–1.24)
GFR calc Af Amer: 30 mL/min — ABNORMAL LOW (ref >60)
GFR calc Af Amer: 37 mL/min — ABNORMAL LOW (ref >60)
GFR calc non Af Amer: 25 mL/min — ABNORMAL LOW (ref >60)
GFR calc non Af Amer: 32 mL/min — ABNORMAL LOW (ref >60)
Glucose, Bld: 79 mg/dL (ref 70–99)
Glucose, Bld: 75 mg/dL (ref 70–99)
Potassium: 6.2 mmol/L — ABNORMAL HIGH (ref 3.5–5.1)
Potassium: 5.9 mmol/L — ABNORMAL HIGH (ref 3.5–5.1)
Sodium: 138 mmol/L (ref 135–145)
Sodium: 138 mmol/L (ref 135–145)

## 2019-06-28 LAB — CBC
HCT: 32 % — ABNORMAL LOW (ref 39.0–52.0)
Hemoglobin: 10.1 g/dL — ABNORMAL LOW (ref 13.0–17.0)
MCH: 28.4 pg (ref 26.0–34.0)
MCHC: 31.6 g/dL (ref 30.0–36.0)
MCV: 89.9 fL (ref 80.0–100.0)
Platelets: 114 10*3/uL — ABNORMAL LOW (ref 150–400)
RBC: 3.56 MIL/uL — ABNORMAL LOW (ref 4.22–5.81)
RDW: 14.4 % (ref 11.5–15.5)
WBC: 2.6 10*3/uL — ABNORMAL LOW (ref 4.0–10.5)
nRBC: 0 % (ref 0.0–0.2)

## 2019-06-28 LAB — BRAIN NATRIURETIC PEPTIDE: B Natriuretic Peptide: 30 pg/mL (ref 0.0–100.0)

## 2019-06-28 LAB — TROPONIN I (HIGH SENSITIVITY)
Troponin I (High Sensitivity): 10 ng/L (ref <18)
Troponin I (High Sensitivity): 9 ng/L (ref <18)

## 2019-06-28 LAB — POTASSIUM: Potassium: 5.1 mmol/L (ref 3.5–5.1)

## 2019-06-28 LAB — PROCALCITONIN: Procalcitonin: <0.1 ng/mL

## 2019-06-28 LAB — C-REACTIVE PROTEIN: CRP: 0.9 mg/dL (ref <1.0)

## 2019-06-28 LAB — D-DIMER, QUANTITATIVE: D-Dimer, Quant: 0.64 ug/mL-FEU — ABNORMAL HIGH (ref 0.00–0.50)

## 2019-06-28 LAB — SARS CORONAVIRUS 2 (TAT 6-24 HRS): SARS Coronavirus 2: POSITIVE — AB

## 2019-06-28 LAB — FERRITIN: Ferritin: 495 ng/mL — ABNORMAL HIGH (ref 24–336)

## 2019-06-28 LAB — ABO/RH: ABO/RH(D): O POS

## 2019-06-28 MED ORDER — HYDROCOD POLST-CPM POLST ER 10-8 MG/5ML PO SUER: 5.0000 mL | Freq: Two times a day (BID) | ORAL | Status: DC | PRN

## 2019-06-28 MED ORDER — GUAIFENESIN-DM 100-10 MG/5ML PO SYRP: 10.0000 mL | ORAL_SOLUTION | ORAL | Status: DC | PRN

## 2019-06-28 MED ORDER — SODIUM ZIRCONIUM CYCLOSILICATE 10 G PO PACK
10.0000 g | PACK | Freq: Two times a day (BID) | ORAL | Status: DC
  Administered 2019-06-28 – 2019-06-29 (×3): 10 g via ORAL
  Filled 2019-06-28 (×4): qty 1

## 2019-06-28 MED ORDER — ZINC SULFATE 220 (50 ZN) MG PO CAPS
220.0000 mg | ORAL_CAPSULE | Freq: Every day | ORAL | Status: DC
  Administered 2019-06-28 – 2019-06-29 (×2): 220 mg via ORAL
  Filled 2019-06-28 (×2): qty 1

## 2019-06-28 MED ORDER — ASCORBIC ACID 500 MG PO TABS
500.0000 mg | ORAL_TABLET | Freq: Every day | ORAL | Status: DC
  Administered 2019-06-28 – 2019-06-29 (×2): 500 mg via ORAL
  Filled 2019-06-28 (×2): qty 1

## 2019-06-28 MED ORDER — SODIUM CHLORIDE 0.9 % IV SOLN: INTRAVENOUS | Status: AC
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
## 2019-06-29 DIAGNOSIS — K567 Ileus, unspecified: Secondary | ICD-10-CM

## 2019-06-29 LAB — COMPREHENSIVE METABOLIC PANEL
ALT: 17 U/L (ref 0–44)
AST: 23 U/L (ref 15–41)
Albumin: 3.1 g/dL — ABNORMAL LOW (ref 3.5–5.0)
Alkaline Phosphatase: 59 U/L (ref 38–126)
Anion gap: 8 (ref 5–15)
BUN: 25 mg/dL — ABNORMAL HIGH (ref 8–23)
CO2: 20 mmol/L — ABNORMAL LOW (ref 22–32)
Calcium: 8.7 mg/dL — ABNORMAL LOW (ref 8.9–10.3)
Chloride: 109 mmol/L (ref 98–111)
Creatinine, Ser: 1.68 mg/dL — ABNORMAL HIGH (ref 0.61–1.24)
GFR calc Af Amer: 43 mL/min — ABNORMAL LOW (ref >60)
GFR calc non Af Amer: 37 mL/min — ABNORMAL LOW (ref >60)
Glucose, Bld: 97 mg/dL (ref 70–99)
Potassium: 5.2 mmol/L — ABNORMAL HIGH (ref 3.5–5.1)
Sodium: 137 mmol/L (ref 135–145)
Total Bilirubin: 0.3 mg/dL (ref 0.3–1.2)
Total Protein: 6.5 g/dL (ref 6.5–8.1)

## 2019-06-29 LAB — CBC WITH DIFFERENTIAL/PLATELET
Abs Immature Granulocytes: 0 10*3/uL (ref 0.00–0.07)
Basophils Absolute: 0 10*3/uL (ref 0.0–0.1)
Basophils Relative: 2 %
Eosinophils Absolute: 0.1 10*3/uL (ref 0.0–0.5)
Eosinophils Relative: 3 %
HCT: 33 % — ABNORMAL LOW (ref 39.0–52.0)
Hemoglobin: 10 g/dL — ABNORMAL LOW (ref 13.0–17.0)
Immature Granulocytes: 0 %
Lymphocytes Relative: 39 %
Lymphs Abs: 1.1 10*3/uL (ref 0.7–4.0)
MCH: 28 pg (ref 26.0–34.0)
MCHC: 30.3 g/dL (ref 30.0–36.0)
MCV: 92.4 fL (ref 80.0–100.0)
Monocytes Absolute: 0.4 10*3/uL (ref 0.1–1.0)
Monocytes Relative: 14 %
Neutro Abs: 1.2 10*3/uL — ABNORMAL LOW (ref 1.7–7.7)
Neutrophils Relative %: 42 %
Platelets: 131 10*3/uL — ABNORMAL LOW (ref 150–400)
RBC: 3.57 MIL/uL — ABNORMAL LOW (ref 4.22–5.81)
RDW: 14.3 % (ref 11.5–15.5)
WBC: 2.7 10*3/uL — ABNORMAL LOW (ref 4.0–10.5)
nRBC: 0 % (ref 0.0–0.2)

## 2019-06-29 LAB — FERRITIN: Ferritin: 445 ng/mL — ABNORMAL HIGH (ref 24–336)

## 2019-06-29 LAB — D-DIMER, QUANTITATIVE: D-Dimer, Quant: 0.59 ug/mL-FEU — ABNORMAL HIGH (ref 0.00–0.50)

## 2019-06-29 LAB — C-REACTIVE PROTEIN: CRP: 0.9 mg/dL (ref <1.0)

## 2019-06-29 MED ORDER — ASCORBIC ACID 500 MG PO TABS: 500.0000 mg | ORAL_TABLET | Freq: Every day | ORAL | 0 refills | Status: AC

## 2019-06-29 MED ORDER — ZINC SULFATE 220 (50 ZN) MG PO CAPS: 220.0000 mg | ORAL_CAPSULE | Freq: Every day | ORAL | 0 refills | Status: AC

## 2019-06-29 MED ORDER — DOCUSATE SODIUM 100 MG PO CAPS: 100.0000 mg | ORAL_CAPSULE | Freq: Two times a day (BID) | ORAL | 0 refills | Status: DC | PRN

## 2019-06-29 NOTE — Progress Notes (Signed)
AVS given to patient and explained at the bedside. Medications and follow up appointments have been explained with pt verbalizing understanding.  

## 2019-06-29 NOTE — Progress Notes (Signed)
PT Cancellation Note  Patient Details Name: Steven [name] MRN: [id number] DOB: December 09, 1934   Cancelled Treatment:    Reason Eval/Treat Not Completed: PT screened, no needs identified, will sign off. IND per staff   [facility] 06/29/2019, 1:12 PM
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
## 2019-07-03 ENCOUNTER — Other Ambulatory Visit: Payer: Self-pay

## 2019-07-03 NOTE — Patient Outreach (Signed)
[facility]) Care Management  07/03/2019  Steven [name] 1934/12/18 [id number]    EMMI-General Discharge RED ON EMMI ALERT Day # 1 Date: 07/01/2019 Red Alert Reason: " Know who to call about changes in condition? No  Scheduled follow-up? No"   Outreach attempt #1 to patient. No answer. RN CM left HIPAA compliant voicemail message along with contact info.       Plan: RN CM will make outreach attempt to patient within 3-4 business days. RN CM will send unsuccessful outreach letter to patient.  Enzo Montgomery, RN,BSN,CCM [facility] Telephonic Care Management Coordinator Direct Phone: [phone] Toll Free: [phone] Fax: [phone]

## 2019-07-04 ENCOUNTER — Other Ambulatory Visit: Payer: Self-pay

## 2019-07-04 NOTE — Patient Outreach (Signed)
[facility]) Care Management  07/04/2019  Steven [name] 1934/10/02 [id number]   EMMI-General Discharge RED ON EMMI ALERT Day # 1 Date: 07/01/2019 Red Alert Reason: " Know who to call about changes in condition? No  Scheduled follow-up? No"   Outreach attempt #2 to patient. Spoke with patient who denies any acute issues or concerns at present. Patient very short and vague when responding to questions. Reviewed and addressed red alerts with patient. He reports that he has contact info for his MDs and knows how and when to contact them. Patient confirms that he has made both of his follow up appts. He goes to see PCP on 07/12/19. He states that he is able to drive himself to appt. He confirms that he has all his meds in the home and no issues or concerns regarding them. Patient declined the need for future follow up calls. He is aware that he can call for any future needs or concerns.      Plan: RN CM close case at this time.    Enzo Montgomery, RN,BSN,CCM [facility] Telephonic Care Management Coordinator Direct Phone: [phone] Toll Free: [phone] Fax: [phone]

## 2019-07-06 ENCOUNTER — Other Ambulatory Visit: Payer: Self-pay | Admitting: Hematology and Oncology

## 2019-07-06 DIAGNOSIS — D703 Neutropenia due to infection: Secondary | ICD-10-CM

## 2019-07-07 ENCOUNTER — Telehealth: Payer: Self-pay | Admitting: *Deleted

## 2019-07-07 ENCOUNTER — Inpatient Hospital Stay: Payer: Medicare HMO

## 2019-07-07 ENCOUNTER — Inpatient Hospital Stay: Payer: Medicare HMO | Attending: Hematology and Oncology | Admitting: Hematology and Oncology

## 2019-07-07 NOTE — Telephone Encounter (Signed)
Upon arrival to [facility], pt stated he had tested + for covid 19 on 06/28/19. He was informed he could not be seen here today. He needs to be re-scheduled to 21 days after his + Covid test. Dr. Lorenso Courier made aware. Appts cancelled for today and scheduling message sent to re-schedule after 07/19/19

## 2019-07-10 ENCOUNTER — Telehealth: Payer: Self-pay | Admitting: Hematology and Oncology

## 2019-07-10 NOTE — Telephone Encounter (Signed)
Scheduled per 1/8 sch msg. Called and left msg. Mailing printout

## 2019-07-12 DIAGNOSIS — Z8616 Personal history of COVID-19: Secondary | ICD-10-CM | POA: Diagnosis not present

## 2019-07-12 DIAGNOSIS — N135 Crossing vessel and stricture of ureter without hydronephrosis: Secondary | ICD-10-CM | POA: Diagnosis not present

## 2019-07-12 DIAGNOSIS — N179 Acute kidney failure, unspecified: Secondary | ICD-10-CM | POA: Diagnosis not present

## 2019-07-12 DIAGNOSIS — E875 Hyperkalemia: Secondary | ICD-10-CM | POA: Diagnosis not present

## 2019-07-26 ENCOUNTER — Telehealth: Payer: Self-pay | Admitting: Cardiology

## 2019-07-26 NOTE — Telephone Encounter (Signed)
Telephone call to Dr Eddie Dibbles office. Spoke with Caryl Pina. Verified patient's dosage of toprol xl is 25 mg daily and has been since September 2020 office visit.

## 2019-07-26 NOTE — Telephone Encounter (Signed)
Pt c/o medication issue:  1. Name of Medication: metoprolol succinate (TOPROL XL) 25 MG 24 hr tablet  2. How are you currently taking this medication (dosage and times per day)? As directed  3. Are you having a reaction (difficulty breathing--STAT)? no  4. What is your medication issue? Dosage confusion   Pt spoke with Caryl Pina at Dr. Eddie Dibbles office and told Caryl Pina that he is taking 25 mg of this medicine, but Dr. Rex Kras has on record that the patient takes 50 mg of this medicine daily. Dr. Eddie Dibbles office wants to make sure both offices are on the same page in regards to dosage instructions before a refill request is submitted by his pharmacy

## 2019-07-28 DIAGNOSIS — D649 Anemia, unspecified: Secondary | ICD-10-CM | POA: Diagnosis not present

## 2019-07-28 DIAGNOSIS — I2510 Atherosclerotic heart disease of native coronary artery without angina pectoris: Secondary | ICD-10-CM | POA: Diagnosis not present

## 2019-07-28 DIAGNOSIS — I10 Essential (primary) hypertension: Secondary | ICD-10-CM | POA: Diagnosis not present

## 2019-07-31 ENCOUNTER — Telehealth: Payer: Self-pay | Admitting: Cardiology

## 2019-07-31 ENCOUNTER — Other Ambulatory Visit: Payer: Self-pay

## 2019-07-31 ENCOUNTER — Other Ambulatory Visit: Payer: Self-pay | Admitting: Cardiology

## 2019-07-31 MED ORDER — METOPROLOL SUCCINATE ER 25 MG PO TB24: 25.0000 mg | ORAL_TABLET | Freq: Every day | ORAL | 0 refills | Status: DC

## 2019-07-31 MED ORDER — ATORVASTATIN CALCIUM 40 MG PO TABS: 40.0000 mg | ORAL_TABLET | Freq: Every day | ORAL | 0 refills | Status: DC

## 2019-07-31 NOTE — Telephone Encounter (Signed)
*  STAT* If patient is at the pharmacy, call can be transferred to refill team.   1. Which medications need to be refilled? (please list name of each medication and dose if known)  atorvastatin (LIPITOR) 40 MG tablet metoprolol succinate (TOPROL XL) 25 MG 24 hr tablet   2. Which pharmacy/location (including street and city if local pharmacy) is medication to be sent to? [facility] - [place], [place] - [place] [address]  3. Do they need a 30 day or 90 day supply? 90 day  Patient is out of medication

## 2019-07-31 NOTE — Progress Notes (Signed)
Sent 30 day refill for Atorvastatin 40 MG & Metoprolol Succinate 25 MG. Patient needs office visit for further refills.

## 2019-07-31 NOTE — Telephone Encounter (Signed)
Sent 30 day refill for Atorvastatin 40 MG & Metoprolol Succinate 25 MG. Patient needs office visit for further refills.

## 2019-08-01 ENCOUNTER — Other Ambulatory Visit: Payer: Self-pay | Admitting: Hematology and Oncology

## 2019-08-01 DIAGNOSIS — D638 Anemia in other chronic diseases classified elsewhere: Secondary | ICD-10-CM
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
## 2019-08-02 ENCOUNTER — Inpatient Hospital Stay: Payer: Medicare HMO | Attending: Hematology and Oncology | Admitting: Hematology and Oncology

## 2019-08-02 ENCOUNTER — Inpatient Hospital Stay: Payer: Medicare HMO

## 2019-08-02 ENCOUNTER — Other Ambulatory Visit: Payer: Self-pay

## 2019-08-02 ENCOUNTER — Encounter: Payer: Self-pay | Admitting: Hematology and Oncology

## 2019-08-02 VITALS — BP 167/61 | HR 64 | Temp 97.5°F | Resp 18 | Ht 72.0 in | Wt 217.1 lb

## 2019-08-02 DIAGNOSIS — N189 Chronic kidney disease, unspecified: Secondary | ICD-10-CM

## 2019-08-02 DIAGNOSIS — Z79899 Other long term (current) drug therapy: Secondary | ICD-10-CM | POA: Diagnosis not present

## 2019-08-02 DIAGNOSIS — D638 Anemia in other chronic diseases classified elsewhere: Secondary | ICD-10-CM

## 2019-08-02 DIAGNOSIS — D61818 Other pancytopenia: Secondary | ICD-10-CM | POA: Diagnosis not present

## 2019-08-02 DIAGNOSIS — N400 Enlarged prostate without lower urinary tract symptoms: Secondary | ICD-10-CM | POA: Diagnosis not present

## 2019-08-02 DIAGNOSIS — R768 Other specified abnormal immunological findings in serum: Secondary | ICD-10-CM

## 2019-08-02 DIAGNOSIS — Z87442 Personal history of urinary calculi: Secondary | ICD-10-CM | POA: Diagnosis not present

## 2019-08-02 DIAGNOSIS — D700 Congenital agranulocytosis: Secondary | ICD-10-CM

## 2019-08-02 DIAGNOSIS — D703 Neutropenia due to infection: Secondary | ICD-10-CM

## 2019-08-02 DIAGNOSIS — D6489 Other specified anemias: Secondary | ICD-10-CM

## 2019-08-02 DIAGNOSIS — N179 Acute kidney failure, unspecified: Secondary | ICD-10-CM | POA: Diagnosis not present

## 2019-08-02 DIAGNOSIS — I2510 Atherosclerotic heart disease of native coronary artery without angina pectoris: Secondary | ICD-10-CM | POA: Insufficient documentation

## 2019-08-02 DIAGNOSIS — Z87891 Personal history of nicotine dependence: Secondary | ICD-10-CM | POA: Insufficient documentation

## 2019-08-02 DIAGNOSIS — D72819 Decreased white blood cell count, unspecified: Secondary | ICD-10-CM | POA: Diagnosis not present

## 2019-08-02 DIAGNOSIS — N183 Chronic kidney disease, stage 3 unspecified: Secondary | ICD-10-CM | POA: Insufficient documentation

## 2019-08-02 LAB — CMP (CANCER CENTER ONLY)
ALT: 12 U/L (ref 0–44)
AST: 19 U/L (ref 15–41)
Albumin: 3.9 g/dL (ref 3.5–5.0)
Alkaline Phosphatase: 78 U/L (ref 38–126)
Anion gap: 8 (ref 5–15)
BUN: 22 mg/dL (ref 8–23)
CO2: 29 mmol/L (ref 22–32)
Calcium: 9.3 mg/dL (ref 8.9–10.3)
Chloride: 104 mmol/L (ref 98–111)
Creatinine: 1.35 mg/dL — ABNORMAL HIGH (ref 0.61–1.24)
GFR, Est AFR Am: 56 mL/min — ABNORMAL LOW (ref >60)
GFR, Estimated: 49 mL/min — ABNORMAL LOW (ref >60)
Glucose, Bld: 93 mg/dL (ref 70–99)
Potassium: 5.1 mmol/L (ref 3.5–5.1)
Sodium: 141 mmol/L (ref 135–145)
Total Bilirubin: 0.3 mg/dL (ref 0.3–1.2)
Total Protein: 8.1 g/dL (ref 6.5–8.1)

## 2019-08-02 LAB — CBC WITH DIFFERENTIAL (CANCER CENTER ONLY)
Abs Immature Granulocytes: 0.02 10*3/uL (ref 0.00–0.07)
Basophils Absolute: 0 10*3/uL (ref 0.0–0.1)
Basophils Relative: 1 %
Eosinophils Absolute: 0.2 10*3/uL (ref 0.0–0.5)
Eosinophils Relative: 5 %
HCT: 34 % — ABNORMAL LOW (ref 39.0–52.0)
Hemoglobin: 11 g/dL — ABNORMAL LOW (ref 13.0–17.0)
Immature Granulocytes: 1 %
Lymphocytes Relative: 29 %
Lymphs Abs: 1 10*3/uL (ref 0.7–4.0)
MCH: 28.2 pg (ref 26.0–34.0)
MCHC: 32.4 g/dL (ref 30.0–36.0)
MCV: 87.2 fL (ref 80.0–100.0)
Monocytes Absolute: 0.5 10*3/uL (ref 0.1–1.0)
Monocytes Relative: 13 %
Neutro Abs: 1.8 10*3/uL (ref 1.7–7.7)
Neutrophils Relative %: 51 %
Platelet Count: 168 10*3/uL (ref 150–400)
RBC: 3.9 MIL/uL — ABNORMAL LOW (ref 4.22–5.81)
RDW: 15.3 % (ref 11.5–15.5)
WBC Count: 3.5 10*3/uL — ABNORMAL LOW (ref 4.0–10.5)
nRBC: 0 % (ref 0.0–0.2)

## 2019-08-02 LAB — IRON AND TIBC
Iron: 78 ug/dL (ref 42–163)
Saturation Ratios: 30 % (ref 20–55)
TIBC: 259 ug/dL (ref 202–409)
UIBC: 181 ug/dL (ref 117–376)

## 2019-08-02 LAB — SEDIMENTATION RATE: Sed Rate: 29 mm/hr — ABNORMAL HIGH (ref 0–16)

## 2019-08-02 LAB — RETIC PANEL
Immature Retic Fract: 14.9 % (ref 2.3–15.9)
RBC.: 3.81 MIL/uL — ABNORMAL LOW (ref 4.22–5.81)
Retic Count, Absolute: 61.7 10*3/uL (ref 19.0–186.0)
Retic Ct Pct: 1.6 % (ref 0.4–3.1)
Reticulocyte Hemoglobin: 33.9 pg (ref >27.9)

## 2019-08-02 LAB — SAVE SMEAR(SSMR), FOR PROVIDER SLIDE REVIEW

## 2019-08-02 LAB — C-REACTIVE PROTEIN: CRP: 1 mg/dL — ABNORMAL HIGH (ref <1.0)

## 2019-08-02 LAB — LACTATE DEHYDROGENASE: LDH: 152 U/L (ref 98–192)

## 2019-08-03 ENCOUNTER — Encounter: Payer: Self-pay | Admitting: Hematology and Oncology

## 2019-08-03 ENCOUNTER — Telehealth: Payer: Self-pay | Admitting: Hematology and Oncology

## 2019-08-03 LAB — ERYTHROPOIETIN: Erythropoietin: 16 m[IU]/mL (ref 2.6–18.5)

## 2019-08-03 NOTE — Telephone Encounter (Signed)
Scheduled per los. Called and left msg. Mailed printout  °

## 2019-08-04 DIAGNOSIS — N135 Crossing vessel and stricture of ureter without hydronephrosis: Secondary | ICD-10-CM | POA: Diagnosis not present

## 2019-08-04 DIAGNOSIS — Z8679 Personal history of other diseases of the circulatory system: Secondary | ICD-10-CM | POA: Diagnosis not present

## 2019-08-04 DIAGNOSIS — I10 Essential (primary) hypertension: Secondary | ICD-10-CM | POA: Diagnosis not present

## 2019-08-04 DIAGNOSIS — Z8739 Personal history of other diseases of the musculoskeletal system and connective tissue: Secondary | ICD-10-CM | POA: Diagnosis not present

## 2019-08-04 DIAGNOSIS — I350 Nonrheumatic aortic (valve) stenosis: Secondary | ICD-10-CM | POA: Diagnosis not present

## 2019-08-04 DIAGNOSIS — D649 Anemia, unspecified: Secondary | ICD-10-CM | POA: Diagnosis not present

## 2019-08-04 DIAGNOSIS — N401 Enlarged prostate with lower urinary tract symptoms: Secondary | ICD-10-CM | POA: Diagnosis not present

## 2019-08-04 DIAGNOSIS — I2510 Atherosclerotic heart disease of native coronary artery without angina pectoris: Secondary | ICD-10-CM | POA: Diagnosis not present

## 2019-08-04 DIAGNOSIS — Z0000 Encounter for general adult medical examination without abnormal findings: Secondary | ICD-10-CM | POA: Diagnosis not present

## 2019-08-04 LAB — COPPER, SERUM: Copper: 158 ug/dL — ABNORMAL HIGH (ref 69–132)

## 2019-08-14 DIAGNOSIS — Z8679 Personal history of other diseases of the circulatory system: Secondary | ICD-10-CM | POA: Diagnosis not present

## 2019-08-14 DIAGNOSIS — N135 Crossing vessel and stricture of ureter without hydronephrosis: Secondary | ICD-10-CM | POA: Diagnosis not present

## 2019-08-14 DIAGNOSIS — I10 Essential (primary) hypertension: Secondary | ICD-10-CM | POA: Diagnosis not present

## 2019-08-14 DIAGNOSIS — Z0000 Encounter for general adult medical examination without abnormal findings: Secondary | ICD-10-CM | POA: Diagnosis not present

## 2019-08-14 DIAGNOSIS — Z8739 Personal history of other diseases of the musculoskeletal system and connective tissue: Secondary | ICD-10-CM | POA: Diagnosis not present

## 2019-08-14 DIAGNOSIS — I2510 Atherosclerotic heart disease of native coronary artery without angina pectoris: Secondary | ICD-10-CM | POA: Diagnosis not present

## 2019-08-14 DIAGNOSIS — N401 Enlarged prostate with lower urinary tract symptoms: Secondary | ICD-10-CM | POA: Diagnosis not present

## 2019-08-14 DIAGNOSIS — I350 Nonrheumatic aortic (valve) stenosis: Secondary | ICD-10-CM | POA: Diagnosis not present

## 2019-08-14 DIAGNOSIS — D649 Anemia, unspecified: Secondary | ICD-10-CM | POA: Diagnosis not present

## 2019-08-15 DIAGNOSIS — N401 Enlarged prostate with lower urinary tract symptoms: Secondary | ICD-10-CM | POA: Diagnosis not present

## 2019-08-15 DIAGNOSIS — R351 Nocturia: Secondary | ICD-10-CM | POA: Diagnosis not present

## 2019-08-25 ENCOUNTER — Other Ambulatory Visit: Payer: Self-pay | Admitting: Emergency Medicine

## 2019-08-25 ENCOUNTER — Other Ambulatory Visit: Payer: Self-pay | Admitting: Cardiology

## 2019-08-25 DIAGNOSIS — D649 Anemia, unspecified: Secondary | ICD-10-CM | POA: Diagnosis not present

## 2019-08-25 DIAGNOSIS — I10 Essential (primary) hypertension: Secondary | ICD-10-CM | POA: Diagnosis not present

## 2019-08-25 DIAGNOSIS — I2510 Atherosclerotic heart disease of native coronary artery without angina pectoris: Secondary | ICD-10-CM | POA: Diagnosis not present

## 2019-08-25 MED ORDER — ATORVASTATIN CALCIUM 40 MG PO TABS: ORAL_TABLET | ORAL | 0 refills | Status: DC

## 2019-08-28 ENCOUNTER — Other Ambulatory Visit: Payer: Self-pay | Admitting: Cardiology

## 2019-09-05 DIAGNOSIS — I2510 Atherosclerotic heart disease of native coronary artery without angina pectoris: Secondary | ICD-10-CM | POA: Diagnosis not present

## 2019-09-05 DIAGNOSIS — N401 Enlarged prostate with lower urinary tract symptoms: Secondary | ICD-10-CM | POA: Diagnosis not present

## 2019-09-05 DIAGNOSIS — D649 Anemia, unspecified: Secondary | ICD-10-CM | POA: Diagnosis not present

## 2019-09-05 DIAGNOSIS — I10 Essential (primary) hypertension: Secondary | ICD-10-CM | POA: Diagnosis not present

## 2019-09-11 ENCOUNTER — Ambulatory Visit: Payer: Medicare HMO | Attending: Internal Medicine

## 2019-09-11 DIAGNOSIS — Z23 Encounter for immunization: Secondary | ICD-10-CM

## 2019-09-11 NOTE — Progress Notes (Signed)
   Covid-19 Vaccination Clinic  Name:  Steven [name]    MRN: [id number] DOB: 12-Apr-1935  09/11/2019  Steven [name] was observed post Covid-19 immunization for 15 minutes without incident. He was provided with Vaccine Information Sheet and instruction to access the V-Safe system.   Steven [name] was instructed to call 911 with any severe reactions post vaccine: Marland Kitchen Difficulty breathing  . Swelling of face and throat  . A fast heartbeat  . A bad rash all over body  . Dizziness and weakness   Immunizations Administered    Name Date Dose VIS Date Route   Pfizer COVID-19 Vaccine 09/11/2019  1:40 PM 0.3 mL 06/09/2019 Intramuscular   Manufacturer: [place]   Lot: [id number]   [place]: [id number]

## 2019-10-03 ENCOUNTER — Ambulatory Visit: Payer: Medicare HMO | Attending: Internal Medicine

## 2019-10-03 ENCOUNTER — Ambulatory Visit: Payer: Medicare HMO

## 2019-10-03 DIAGNOSIS — Z23 Encounter for immunization: Secondary | ICD-10-CM

## 2019-10-03 NOTE — Progress Notes (Signed)
   Covid-19 Vaccination Clinic  Name:  Steven [name]    MRN: [id number] DOB: 12/19/1934  10/03/2019  Mr. Steven [name] was observed post Covid-19 immunization for 15 minutes without incident. He was provided with Vaccine Information Sheet and instruction to access the V-Safe system.   Mr. Steven [name] was instructed to call 911 with any severe reactions post vaccine: Marland Kitchen Difficulty breathing  . Swelling of face and throat  . A fast heartbeat  . A bad rash all over body  . Dizziness and weakness   Immunizations Administered    Name Date Dose VIS Date Route   Pfizer COVID-19 Vaccine 10/03/2019  9:00 AM 0.3 mL 06/09/2019 Intramuscular   Manufacturer: [employer], [employer]   Lot: [id number]   [place]: [id number]

## 2019-10-11 NOTE — Progress Notes (Signed)
Cardiology Office Note:    Date:  10/12/2019   ID:  [name], DOB 02-01-1935, MRN [id number]  PCP:  Hulan Fess, MD  Cardiologist:  Shirlee More, MD    Referring MD: Hulan Fess, MD    ASSESSMENT:    1. Coronary artery disease of native artery of native heart with stable angina pectoris ([place])   2. Hypertensive heart disease without CHF   3. Hyperlipidemia, unspecified hyperlipidemia type   4. Nonrheumatic aortic valve stenosis    PLAN:    In order of problems listed above:  1. Stable CAD continue medical therapy clopidogrel beta-blocker statin having no angina on current treatment New York Heart Association class I 2. Stable BP at target continue current medical therapy 3. Ideal lipids continue his high intensity statin with CAD well-tolerated 4. Asymptomatic I suspect progression we will plan repeat echocardiogram a fall see me a week later he is asymptomatic   Next appointment: 6 months   Medication Adjustments/Labs and Tests Ordered: Current medicines are reviewed at length with the patient today.  Concerns regarding medicines are outlined above.  No orders of the defined types were placed in this encounter.  No orders of the defined types were placed in this encounter.   No chief complaint on file.   History of Present Illness:    Steven [name] is a 85 y.o. male with a hx of CAD with PCI and stent left anterior descending coronary artery 02/24/2018, mild aortic valve stenosis at catheterization August 2019, atrial flutter with EP ablation in September of  2017, hypertension, hyperlipidemia and obesity last seen 03/02/2019. Compliance with diet, lifestyle and medications: Yes  He continues to work he says at times he short of breath but is not severely limiting he has had no chest pain tolerates his clopidogrel without GI upset and his lipid-lowering treatment without muscle pain or weakness.  He has had both doses of vaccine.  No angina edema orthopnea.   Recent labs performed in his PCP office shows cholesterol 134 HDL 54 LDL 65 A1c 6.0% all at target.  He has aortic stenosis the murmur is more prominent be formal recheck echocardiogram this fall a 2-year interval regarding progression of his mild aortic stenosis Past Medical History:  Diagnosis Date  . Atrial flutter ([place])   . CAD (coronary artery disease)    02/24/18 PCI/DES x1 to the pLAD  . Carpal tunnel syndrome   . Cataract    left eye  . Chest pain   . CKD (chronic kidney disease), stage III   . Enlarged prostate   . History of gout   . History of kidney stones    pt has one now but not giving him any problems  . Numbness    both hands pt states pinched. 12-17-14 Gabapentin has improved.  . Pneumonia    history of pneumonia  . Psoriasis     Past Surgical History:  Procedure Laterality Date  . CATARACT EXTRACTION W/PHACO Left 03/15/2013   Procedure: CATARACT EXTRACTION PHACO AND INTRAOCULAR LENS PLACEMENT (IOC);  Surgeon: Adonis Brook, MD;  Location: [place];  Service: Ophthalmology;  Laterality: Left;  . COLONOSCOPY    . COLONOSCOPY WITH PROPOFOL N/A 12/24/2014   Procedure: COLONOSCOPY WITH PROPOFOL;  Surgeon: Garlan Fair, MD;  [facility];  Service: Endoscopy;  Laterality: N/A;  . CORONARY STENT INTERVENTION N/A 02/24/2018   Procedure: CORONARY STENT INTERVENTION;  Surgeon: Jettie Booze, MD;  Location: [place] [facility];  Service:  Cardiovascular;  Laterality: N/A;  . ELECTROPHYSIOLOGIC STUDY N/A 02/28/2016   Procedure: A-Flutter Ablation;  Surgeon: Deboraha Sprang, MD;  Location: [place] [facility];  Service: Cardiovascular;  Laterality: N/A;  . right cataract removed     . RIGHT/LEFT HEART CATH AND CORONARY ANGIOGRAPHY N/A 02/24/2018   Procedure: RIGHT/LEFT HEART CATH AND CORONARY ANGIOGRAPHY;  Surgeon: Jettie Booze, MD;  Location: [place] [facility];  Service: Cardiovascular;  Laterality: N/A;  . TONSILLECTOMY     as child    Current  Medications: Current Meds  Medication Sig  . ascorbic acid (VITAMIN C) 500 MG tablet Take 1 tablet (500 mg total) by mouth daily.  Marland Kitchen atorvastatin (LIPITOR) 40 MG tablet TAKE ONE TABLET BY MOUTH EVERY MORNING *Patient must have Office visit before any further refills*  . clopidogrel (PLAVIX) 75 MG tablet Take 1 tablet (75 mg total) by mouth daily.  . Cyanocobalamin (VITAMIN B-12 PO) Take 1 tablet by mouth daily.  . indomethacin (INDOCIN) 25 MG capsule Take 25-50 mg by mouth every 6 (six) hours as needed (gout flareup).   . metoprolol succinate (TOPROL-XL) 25 MG 24 hr tablet TAKE ONE TABLET BY MOUTH EVERY MORNING *Must have office visit before any further refills  . nitroGLYCERIN (NITROSTAT) 0.4 MG SL tablet Place 1 tablet (0.4 mg total) under the tongue every 5 (five) minutes as needed.  . Omega-3 Fatty Acids (FISH OIL) 1000 MG CAPS Take 1,000 mg by mouth daily.  . tamsulosin (FLOMAX) 0.4 MG CAPS capsule Take 0.4 mg by mouth daily.     Allergies:   Patient has no known allergies.   Social History   Socioeconomic History  . Marital status: Widowed    Spouse name: Not on file  . Number of children: Not on file  . Years of education: Not on file  . Highest education level: Not on file  Occupational History  . Not on file  Tobacco Use  . Smoking status: Former Smoker    Types: Cigars  . Smokeless tobacco: Former Systems developer    Types: Chew  . Tobacco comment: smoked cigars 6yrs ago  Substance and Sexual Activity  . Alcohol use: No  . Drug use: No  . Sexual activity: Never  Other Topics Concern  . Not on file  Social History Narrative  . Not on file   Social Determinants of Health   Financial Resource Strain:   . Difficulty of Paying Living Expenses:   Food Insecurity:   . Worried About Charity fundraiser in the Last Year:   . Arboriculturist in the Last Year:   Transportation Needs:   . Film/video editor (Medical):   Marland Kitchen Lack of Transportation (Non-Medical):   Physical  Activity:   . Days of Exercise per Week:   . Minutes of Exercise per Session:   Stress:   . Feeling of Stress :   Social Connections:   . Frequency of Communication with Friends and Family:   . Frequency of Social Gatherings with Friends and Family:   . Attends Religious Services:   . Active Member of Clubs or Organizations:   . Attends Archivist Meetings:   Marland Kitchen Marital Status:      Family History: The patient's family history includes Diabetes in his son; Healthy in his father; Kidney failure in his sister and sister; Other in his mother. There is no history of Heart attack. ROS:   Please see the history of present illness.  All other systems reviewed and are negative.  EKGs/Labs/Other Studies Reviewed:    The following studies were reviewed today:   Recent Labs: 01/27/2019: Magnesium 1.9 06/28/2019: B Natriuretic Peptide 30.0 08/02/2019: ALT 12; BUN 22; Creatinine 1.35; Hemoglobin 11.0; Platelet Count 168; Potassium 5.1; Sodium 141  Recent Lipid Panel    Component Value Date/Time   CHOL 119 03/02/2019 0945   TRIG 56 03/02/2019 0945   HDL 50 03/02/2019 0945   CHOLHDL 2.4 03/02/2019 0945   CHOLHDL 3.2 01/11/2018 0701   VLDL 17 01/11/2018 0701   LDLCALC 56 03/02/2019 0945    Physical Exam:    VS:  BP 130/68 (BP Location: Right Arm, Patient Position: Sitting, Cuff Size: Normal)   Pulse 71   Temp 97.9 F (36.6 C)   Ht 6' (1.829 m)   Wt 226 lb 6.4 oz (102.7 kg)   SpO2 97%   BMI 30.71 kg/m     Wt Readings from Last 3 Encounters:  10/12/19 226 lb 6.4 oz (102.7 kg)  08/02/19 217 lb 1.6 oz (98.5 kg)  06/28/19 202 lb 9.6 oz (91.9 kg)     GEN:  Well nourished, well developed in no acute distress HEENT: Normal NECK: No JVD; No carotid bruits LYMPHATICS: No lymphadenopathy CARDIAC: He has a grade 2/6 midsystolic ejection murmur radiates up to the aortic area S2 is normal no AR RRR, no rubs, gallops RESPIRATORY:  Clear to auscultation without rales, wheezing  or rhonchi  ABDOMEN: Soft, non-tender, non-distended MUSCULOSKELETAL:  No edema; No deformity  SKIN: Warm and dry NEUROLOGIC:  Alert and oriented x 3 PSYCHIATRIC:  Normal affect    Signed, Shirlee More, MD  10/12/2019 9:14 AM    [place]

## 2019-10-12 ENCOUNTER — Other Ambulatory Visit: Payer: Self-pay

## 2019-10-12 ENCOUNTER — Encounter: Payer: Self-pay | Admitting: Cardiology

## 2019-10-12 ENCOUNTER — Ambulatory Visit: Payer: Medicare HMO | Admitting: Cardiology

## 2019-10-12 VITALS — BP 130/68 | HR 71 | Temp 97.9°F | Ht 72.0 in | Wt 226.4 lb

## 2019-10-12 DIAGNOSIS — I350 Nonrheumatic aortic (valve) stenosis: Secondary | ICD-10-CM

## 2019-10-12 DIAGNOSIS — I25118 Atherosclerotic heart disease of native coronary artery with other forms of angina pectoris: Secondary | ICD-10-CM

## 2019-10-12 DIAGNOSIS — I119 Hypertensive heart disease without heart failure: Secondary | ICD-10-CM | POA: Diagnosis not present

## 2019-10-12 DIAGNOSIS — E785 Hyperlipidemia, unspecified: Secondary | ICD-10-CM | POA: Diagnosis not present

## 2019-10-12 NOTE — Patient Instructions (Signed)
Medication Instructions:  Your physician recommends that you continue on your current medications as directed. Please refer to the Current Medication list given to you today.  *If you need a refill on your cardiac medications before your next appointment, please call your pharmacy*   Lab Work: None If you have labs (blood work) drawn today and your tests are completely normal, you will receive your results only by: Marland Kitchen MyChart Message (if you have MyChart) OR . A paper copy in the mail If you have any lab test that is abnormal or we need to change your treatment, we will call you to review the results.   Testing/Procedures: Your physician has requested that you have an echocardiogram to be done one week prior to your follow up visit. Echocardiography is a painless test that uses sound waves to create images of your heart. It provides your doctor with information about the size and shape of your heart and how well your heart's chambers and valves are working. This procedure takes approximately one hour. There are no restrictions for this procedure.     Follow-Up: At [facility], you and your health needs are our priority.  As part of our continuing mission to provide you with exceptional heart care, we have created designated Provider Care Teams.  These Care Teams include your primary Cardiologist (physician) and Advanced Practice Providers (APPs -  Physician Assistants and Nurse Practitioners) who all work together to provide you with the care you need, when you need it.  We recommend signing up for the patient portal called "MyChart".  Sign up information is provided on this After Visit Summary.  MyChart is used to connect with patients for Virtual Visits (Telemedicine).  Patients are able to view lab/test results, encounter notes, upcoming appointments, etc.  Non-urgent messages can be sent to your provider as well.   To learn more about what you can do with MyChart, go to  [email or web address].    Your next appointment:   6 month(s)  The format for your next appointment:   In Person  Provider:   Shirlee More, MD   Other Instructions

## 2019-10-12 NOTE — Addendum Note (Signed)
Addended by: Resa Miner I on: 10/12/2019 11:42 AM   Modules accepted: Orders

## 2019-10-18 DIAGNOSIS — I10 Essential (primary) hypertension: Secondary | ICD-10-CM | POA: Diagnosis not present

## 2019-10-18 DIAGNOSIS — D649 Anemia, unspecified: Secondary | ICD-10-CM | POA: Diagnosis not present

## 2019-10-18 DIAGNOSIS — I2510 Atherosclerotic heart disease of native coronary artery without angina pectoris: Secondary | ICD-10-CM | POA: Diagnosis not present

## 2019-11-16 ENCOUNTER — Other Ambulatory Visit: Payer: Self-pay | Admitting: Cardiology

## 2019-11-20 ENCOUNTER — Other Ambulatory Visit: Payer: Self-pay | Admitting: Cardiology

## 2019-11-24 DIAGNOSIS — D649 Anemia, unspecified: Secondary | ICD-10-CM | POA: Diagnosis not present

## 2019-11-24 DIAGNOSIS — I2510 Atherosclerotic heart disease of native coronary artery without angina pectoris: Secondary | ICD-10-CM | POA: Diagnosis not present

## 2019-11-24 DIAGNOSIS — I10 Essential (primary) hypertension: Secondary | ICD-10-CM | POA: Diagnosis not present

## 2019-11-24 DIAGNOSIS — N401 Enlarged prostate with lower urinary tract symptoms: Secondary | ICD-10-CM | POA: Diagnosis not present

## 2019-11-25 ENCOUNTER — Other Ambulatory Visit: Payer: Self-pay | Admitting: Cardiology

## 2019-12-25 DIAGNOSIS — N401 Enlarged prostate with lower urinary tract symptoms: Secondary | ICD-10-CM | POA: Diagnosis not present

## 2019-12-25 DIAGNOSIS — I10 Essential (primary) hypertension: Secondary | ICD-10-CM | POA: Diagnosis not present

## 2019-12-25 DIAGNOSIS — I2510 Atherosclerotic heart disease of native coronary artery without angina pectoris: Secondary | ICD-10-CM | POA: Diagnosis not present

## 2019-12-25 DIAGNOSIS — D649 Anemia, unspecified: Secondary | ICD-10-CM | POA: Diagnosis not present

## 2020-01-02 DIAGNOSIS — H40013 Open angle with borderline findings, low risk, bilateral: Secondary | ICD-10-CM | POA: Diagnosis not present

## 2020-01-02 DIAGNOSIS — H57813 Brow ptosis, bilateral: Secondary | ICD-10-CM | POA: Diagnosis not present

## 2020-01-02 DIAGNOSIS — H43813 Vitreous degeneration, bilateral: Secondary | ICD-10-CM | POA: Diagnosis not present

## 2020-01-02 DIAGNOSIS — H35033 Hypertensive retinopathy, bilateral: Secondary | ICD-10-CM | POA: Diagnosis not present

## 2020-01-12 ENCOUNTER — Other Ambulatory Visit: Payer: Self-pay | Admitting: Cardiology

## 2020-01-12 DIAGNOSIS — I2510 Atherosclerotic heart disease of native coronary artery without angina pectoris: Secondary | ICD-10-CM | POA: Diagnosis not present

## 2020-01-12 DIAGNOSIS — D649 Anemia, unspecified: Secondary | ICD-10-CM | POA: Diagnosis not present

## 2020-01-12 DIAGNOSIS — I10 Essential (primary) hypertension: Secondary | ICD-10-CM | POA: Diagnosis not present

## 2020-01-12 DIAGNOSIS — N401 Enlarged prostate with lower urinary tract symptoms: Secondary | ICD-10-CM | POA: Diagnosis not present

## 2020-01-12 MED ORDER — METOPROLOL SUCCINATE ER 25 MG PO TB24: 25.0000 mg | ORAL_TABLET | Freq: Every morning | ORAL | 3 refills | Status: DC

## 2020-01-12 MED ORDER — ATORVASTATIN CALCIUM 40 MG PO TABS: ORAL_TABLET | ORAL | 2 refills | Status: DC

## 2020-01-12 NOTE — Telephone Encounter (Signed)
*  STAT* If patient is at the pharmacy, call can be transferred to refill team.   1. Which medications need to be refilled? (please list name of each medication and dose if known)  atorvastatin (LIPITOR) 40 MG tablet metoprolol succinate (TOPROL-XL) 25 MG 24 hr tablet 2. Which pharmacy/location (including street and city if local pharmacy) is medication to be sent to? [place]  3. Do they need a 30 day or 90 day supply? 90 day supply

## 2020-01-12 NOTE — Telephone Encounter (Signed)
Refill sent in per request.  

## 2020-01-19 DIAGNOSIS — R109 Unspecified abdominal pain: Secondary | ICD-10-CM | POA: Diagnosis not present

## 2020-01-28 ENCOUNTER — Other Ambulatory Visit: Payer: Self-pay | Admitting: Hematology and Oncology

## 2020-01-28 DIAGNOSIS — D649 Anemia, unspecified: Secondary | ICD-10-CM

## 2020-01-28 NOTE — Progress Notes (Signed)
[facility] [phone]   [phone]  PROGRESS NOTE  Patient Care Team: Hulan Fess, MD as PCP - General (Family Medicine) Jacolyn Reedy, MD as Consulting Physician (Cardiology) Richardo Priest, MD as Consulting Physician (Cardiology)  Hematological/Oncological History #Leukopenia (Neutropenia)  #Normocytic Anemia 1) 04/08/2015: WBC 4.0, Hgb 13.1, Plt 217, MCV 86.2.  2) 11/15/2015: WBC 3.5, Hgb 12.8, Plt 183, MCV 85.7. ANC 1.3 3) 06/29/2019: WBC 2.7, Hgb 10.0, Plt 131, MCV 92.4, Plt 131 4) 08/02/2019: establish care with Dr. Lorenso Courier. WBC 3.5, Hgb 11.0, Plt 168 5) 01/29/2020: WBC 3.6, Hgb 11.2, MCV 87.3, Plt 150  Interval History:  Steven [name] 85 y.o. male with medical history significant for leukopenia and anemia presents for a follow up visit. The patient's last visit was on 08/02/2019 at which time he established care. In the interim since the last visit he has had no hospitalizations or ED visits.  On exam today Mr. Vallery Ridge reports that he has been doing well.  He reports that he did have an episode of left-sided back pain that was so painful that had him "walking sideways", and he reports it is subsequently resolved but he still has some residual pain.  He notes that he does feel tired sometimes but denies having any issues with shortness of breath.  He reports that walking up to the clinic today from the parking lot did give him some mild shortness of breath.  He notes that he has not been having any issues with fevers, chills, sweats, nausea, vomiting or diarrhea.  A full 10 point ROS is listed below.  MEDICAL HISTORY:  Past Medical History:  Diagnosis Date  . Atrial flutter ([place])   . CAD (coronary artery disease)    02/24/18 PCI/DES x1 to the pLAD  . Carpal tunnel syndrome   . Cataract    left eye  . Chest pain   . CKD (chronic kidney disease), stage III   . Enlarged prostate   . History of gout   . History of kidney stones    pt has  one now but not giving him any problems  . Numbness    both hands pt states pinched. 12-17-14 Gabapentin has improved.  . Pneumonia    history of pneumonia  . Psoriasis     SURGICAL HISTORY: Past Surgical History:  Procedure Laterality Date  . CATARACT EXTRACTION W/PHACO Left 03/15/2013   Procedure: CATARACT EXTRACTION PHACO AND INTRAOCULAR LENS PLACEMENT (IOC);  Surgeon: Adonis Brook, MD;  Location: [facility];  Service: Ophthalmology;  Laterality: Left;  . COLONOSCOPY    . COLONOSCOPY WITH PROPOFOL N/A 12/24/2014   Procedure: COLONOSCOPY WITH PROPOFOL;  Surgeon: Garlan Fair, MD;  Location: [facility];  Service: Endoscopy;  Laterality: N/A;  . CORONARY STENT INTERVENTION N/A 02/24/2018   Procedure: CORONARY STENT INTERVENTION;  Surgeon: Jettie Booze, MD;  Location: [facility];  Service: Cardiovascular;  Laterality: N/A;  . ELECTROPHYSIOLOGIC STUDY N/A 02/28/2016   Procedure: A-Flutter Ablation;  Surgeon: Deboraha Sprang, MD;  Location: [facility];  Service: Cardiovascular;  Laterality: N/A;  . right cataract removed     . RIGHT/LEFT HEART CATH AND CORONARY ANGIOGRAPHY N/A 02/24/2018   Procedure: RIGHT/LEFT HEART CATH AND CORONARY ANGIOGRAPHY;  Surgeon: Jettie Booze, MD;  Location: [facility];  Service: Cardiovascular;  Laterality: N/A;  . TONSILLECTOMY     as child    SOCIAL HISTORY: Social History   Socioeconomic History  .  Marital status: Widowed    Spouse name: Not on file  . Number of children: Not on file  . Years of education: Not on file  . Highest education level: Not on file  Occupational History  . Not on file  Tobacco Use  . Smoking status: Former Smoker    Types: Cigars  . Smokeless tobacco: Former Systems developer    Types: Chew  . Tobacco comment: smoked cigars 26yr ago  Vaping Use  . Vaping Use: Never used  Substance and Sexual Activity  . Alcohol use: No  . Drug use: No  . Sexual activity: Never  Other Topics Concern  . Not on  file  Social History Narrative  . Not on file   Social Determinants of Health   Financial Resource Strain:   . Difficulty of Paying Living Expenses:   Food Insecurity:   . Worried About RCharity fundraiserin the Last Year:   . RArboriculturistin the Last Year:   Transportation Needs:   . LFilm/video editor(Medical):   .Marland KitchenLack of Transportation (Non-Medical):   Physical Activity:   . Days of Exercise per Week:   . Minutes of Exercise per Session:   Stress:   . Feeling of Stress :   Social Connections:   . Frequency of Communication with Friends and Family:   . Frequency of Social Gatherings with Friends and Family:   . Attends Religious Services:   . Active Member of Clubs or Organizations:   . Attends CArchivistMeetings:   .Marland KitchenMarital Status:   Intimate Partner Violence:   . Fear of Current or Ex-Partner:   . Emotionally Abused:   .Marland KitchenPhysically Abused:   . Sexually Abused:     FAMILY HISTORY: Family History  Problem Relation Age of Onset  . Other Mother        pPsychologist, forensic . Healthy Father   . Kidney failure Sister   . Kidney failure Sister   . Diabetes Son   . Heart attack Neg Hx     ALLERGIES:  has No Known Allergies.  MEDICATIONS:  Current Outpatient Medications  Medication Sig Dispense Refill  . ascorbic acid (VITAMIN C) 500 MG tablet Take 1 tablet (500 mg total) by mouth daily. 30 tablet 0  . atorvastatin (LIPITOR) 40 MG tablet TAKE ONE TABLET BY MOUTH EVERY MORNING 90 tablet 2  . clopidogrel (PLAVIX) 75 MG tablet TAKE ONE TABLET BY MOUTH EVERY MORNING 90 tablet 2  . Cyanocobalamin (VITAMIN B-12 PO) Take 1 tablet by mouth daily.    .Marland Kitchendocusate sodium (COLACE) 100 MG capsule Take 1 capsule (100 mg total) by mouth 2 (two) times daily as needed for mild constipation. (Patient not taking: Reported on 10/12/2019) 10 capsule 0  . indomethacin (INDOCIN) 25 MG capsule Take 25-50 mg by mouth every 6 (six) hours as needed (gout flareup).     . metoprolol  succinate (TOPROL-XL) 25 MG 24 hr tablet Take 1 tablet (25 mg total) by mouth every morning. 90 tablet 3  . nitroGLYCERIN (NITROSTAT) 0.4 MG SL tablet Place 1 tablet (0.4 mg total) under the tongue every 5 (five) minutes as needed. 25 tablet 2  . Omega-3 Fatty Acids (FISH OIL) 1000 MG CAPS Take 1,000 mg by mouth daily.    . tamsulosin (FLOMAX) 0.4 MG CAPS capsule Take 0.4 mg by mouth daily.     No current facility-administered medications for this visit.    REVIEW OF  SYSTEMS:   Constitutional: ( - ) fevers, ( - )  chills , ( - ) night sweats Eyes: ( - ) blurriness of vision, ( - ) double vision, ( - ) watery eyes Ears, nose, mouth, throat, and face: ( - ) mucositis, ( - ) sore throat Respiratory: ( - ) cough, ( - ) dyspnea, ( - ) wheezes Cardiovascular: ( - ) palpitation, ( - ) chest discomfort, ( - ) lower extremity swelling Gastrointestinal:  ( - ) nausea, ( - ) heartburn, ( - ) change in bowel habits Skin: ( - ) abnormal skin rashes Lymphatics: ( - ) new lymphadenopathy, ( - ) easy bruising Neurological: ( - ) numbness, ( - ) tingling, ( - ) new weaknesses Behavioral/Psych: ( - ) mood change, ( - ) new changes  All other systems were reviewed with the patient and are negative.  PHYSICAL EXAMINATION: ECOG PERFORMANCE STATUS: 0 - Asymptomatic  Vitals:   01/29/20 1015  BP: (!) 171/63  Pulse: 58  Resp: 16  Temp: 98.1 F (36.7 C)  SpO2: 100%   Filed Weights   01/29/20 1015  Weight: (!) 227 lb 11.2 oz (103.3 kg)    GENERAL: elderly African American male, alert, no distress and comfortable SKIN: skin color, texture, turgor are normal, no rashes or significant lesions EYES: conjunctiva are pink and non-injected, sclera clear LUNGS: clear to auscultation and percussion with normal breathing effort HEART: regular rate & rhythm and no murmurs and no lower extremity edema Musculoskeletal: no cyanosis of digits and no clubbing  PSYCH: alert & oriented x 3, fluent speech NEURO: no  focal motor/sensory deficits  LABORATORY DATA:  I have reviewed the data as listed CBC Latest Ref Rng & Units 01/29/2020 08/02/2019 06/29/2019  WBC 4.0 - 10.5 K/uL 3.6(L) 3.5(L) 2.7(L)  Hemoglobin 13.0 - 17.0 g/dL 11.2(L) 11.0(L) 10.0(L)  Hematocrit 39 - 52 % 34.4(L) 34.0(L) 33.0(L)  Platelets 150 - 400 K/uL 150 168 131(L)    CMP Latest Ref Rng & Units 01/29/2020 08/02/2019 06/29/2019  Glucose 70 - 99 mg/dL 94 93 97  BUN 8 - 23 mg/dL 14 22 25(H)  Creatinine 0.61 - 1.24 mg/dL 1.18 1.35(H) 1.68(H)  Sodium 135 - 145 mmol/L 140 141 137  Potassium 3.5 - 5.1 mmol/L 4.5 5.1 5.2(H)  Chloride 98 - 111 mmol/L 105 104 109  CO2 22 - 32 mmol/L 26 29 20(L)  Calcium 8.9 - 10.3 mg/dL 9.6 9.3 8.7(L)  Total Protein 6.5 - 8.1 g/dL 7.2 8.1 6.5  Total Bilirubin 0.3 - 1.2 mg/dL 0.4 0.3 0.3  Alkaline Phos 38 - 126 U/L 59 78 59  AST 15 - 41 U/L _0 ALT 0 - 44 U/L _1 Iron/TIBC/Ferritin/ %Sat    Component Value Date/Time   IRON 78 08/02/2019 0932   IRON 49 02/04/2015 1229   TIBC 259 08/02/2019 0932   TIBC 239 02/04/2015 1229   FERRITIN 445 (H) 06/29/2019 0305   FERRITIN 199 02/04/2015 1229   IRONPCTSAT 30 08/02/2019 0932   IRONPCTSAT 20 02/04/2015 1229    11/25/2018: SPEP no M protein noted.   BLOOD FILM:  Prior review of the peripheral blood smear showed normal appearing white cells with neutrophils that were appropriately lobated and granulated. There was no predominance of bi-lobed or hyper-segmented neutrophils appreciated. No Dohle bodies were noted. There was no left shifting, immature forms or blasts noted. Lymphocytes remain normal in size without any predominance of large granular lymphocytes.  Red cells show no anisopoikilocytosis, macrocytes , microcytes or polychromasia. There were no schistocytes, target cells, echinocytes, acanthocytes, dacrocytes, or stomatocytes.There was no rouleaux formation, nucleated red cells, or intra-cellular inclusions noted. The platelets are normal in  size, shape, and color without any clumping evident.  RADIOGRAPHIC STUDIES: I have personally reviewed the radiological images as listed and agreed with the findings in the report. No results found.  ASSESSMENT & PLAN Ion Gonnella Corliss 85 y.o. male with medical history significant for leukopenia and anemia presents for a follow up visit.   On exam today Mr.Roark's labs appear stable from prior.  His hemoglobin is greater than 11, his platelets are at 150, and his white blood cell count is stable at 3.6.  Given these findings I do believe the patient is currently at his baseline.  And I think there is any indication for a bone marrow biopsy given the stability of his counts over the last year.  I would recommend we have the patient follow-up in clinic in about 12 months time to assure that there has been no decline in his blood counts.  If everything is stable at that time I think would be reasonable to discharge him from the clinic.  #Leukopenia (Neutropenia)  #Normocytic Anemia --probable etiology includes anemia of chronic disease (due to CKD) or chronic inflammation. Leukopenia may be 2/2 to prior Hepatitis B infection or normal variant. Nutritional etiologies less likely given prior workup.  --today will repeat CBC, CMP, retic panel today. --prior evaluation showed no M protein on SPEP (10/2018) and no evidence of vitamin b12/foalte deficiency --can consider bone marrow biopsy if counts worsen, though at this time there is no compelling indication. --RTC in 12 months to continue monitoring  No orders of the defined types were placed in this encounter.  All questions were answered. The patient knows to call the clinic with any problems, questions or concerns.  A total of more than 20 minutes were spent on this encounter and over half of that time was spent on counseling and coordination of care as outlined above.   Ledell Peoples, MD Department of Hematology/Oncology [place] [facility] Phone: [phone] Pager: [phone] Email: [email or web address]  01/29/2020 12:06 PM

## 2020-01-29 ENCOUNTER — Inpatient Hospital Stay: Payer: Medicare HMO

## 2020-01-29 ENCOUNTER — Encounter: Payer: Self-pay | Admitting: Hematology and Oncology

## 2020-01-29 ENCOUNTER — Other Ambulatory Visit: Payer: Self-pay

## 2020-01-29 ENCOUNTER — Inpatient Hospital Stay: Payer: Medicare HMO | Attending: Hematology and Oncology | Admitting: Hematology and Oncology

## 2020-01-29 VITALS — BP 171/63 | HR 58 | Temp 98.1°F | Resp 16 | Ht 72.0 in | Wt 227.7 lb

## 2020-01-29 DIAGNOSIS — Z87891 Personal history of nicotine dependence: Secondary | ICD-10-CM | POA: Insufficient documentation

## 2020-01-29 DIAGNOSIS — I2510 Atherosclerotic heart disease of native coronary artery without angina pectoris: Secondary | ICD-10-CM | POA: Insufficient documentation

## 2020-01-29 DIAGNOSIS — Z87442 Personal history of urinary calculi: Secondary | ICD-10-CM | POA: Insufficient documentation

## 2020-01-29 DIAGNOSIS — D61818 Other pancytopenia: Secondary | ICD-10-CM | POA: Diagnosis not present

## 2020-01-29 DIAGNOSIS — D638 Anemia in other chronic diseases classified elsewhere: Secondary | ICD-10-CM

## 2020-01-29 DIAGNOSIS — N183 Chronic kidney disease, stage 3 unspecified: Secondary | ICD-10-CM | POA: Insufficient documentation

## 2020-01-29 DIAGNOSIS — N400 Enlarged prostate without lower urinary tract symptoms: Secondary | ICD-10-CM | POA: Insufficient documentation

## 2020-01-29 DIAGNOSIS — R0602 Shortness of breath: Secondary | ICD-10-CM | POA: Diagnosis not present

## 2020-01-29 DIAGNOSIS — D649 Anemia, unspecified: Secondary | ICD-10-CM

## 2020-01-29 DIAGNOSIS — D700 Congenital agranulocytosis: Secondary | ICD-10-CM

## 2020-01-29 DIAGNOSIS — Z79899 Other long term (current) drug therapy: Secondary | ICD-10-CM | POA: Insufficient documentation

## 2020-01-29 DIAGNOSIS — I4892 Unspecified atrial flutter: Secondary | ICD-10-CM | POA: Insufficient documentation

## 2020-01-29 LAB — CBC WITH DIFFERENTIAL (CANCER CENTER ONLY)
Abs Immature Granulocytes: 0.01 10*3/uL (ref 0.00–0.07)
Basophils Absolute: 0 10*3/uL (ref 0.0–0.1)
Basophils Relative: 1 %
Eosinophils Absolute: 0.1 10*3/uL (ref 0.0–0.5)
Eosinophils Relative: 3 %
HCT: 34.4 % — ABNORMAL LOW (ref 39.0–52.0)
Hemoglobin: 11.2 g/dL — ABNORMAL LOW (ref 13.0–17.0)
Immature Granulocytes: 0 %
Lymphocytes Relative: 35 %
Lymphs Abs: 1.3 10*3/uL (ref 0.7–4.0)
MCH: 28.4 pg (ref 26.0–34.0)
MCHC: 32.6 g/dL (ref 30.0–36.0)
MCV: 87.3 fL (ref 80.0–100.0)
Monocytes Absolute: 0.6 10*3/uL (ref 0.1–1.0)
Monocytes Relative: 18 %
Neutro Abs: 1.6 10*3/uL — ABNORMAL LOW (ref 1.7–7.7)
Neutrophils Relative %: 43 %
Platelet Count: 150 10*3/uL (ref 150–400)
RBC: 3.94 MIL/uL — ABNORMAL LOW (ref 4.22–5.81)
RDW: 14.1 % (ref 11.5–15.5)
WBC Count: 3.6 10*3/uL — ABNORMAL LOW (ref 4.0–10.5)
nRBC: 0 % (ref 0.0–0.2)

## 2020-01-29 LAB — CMP (CANCER CENTER ONLY)
ALT: 12 U/L (ref 0–44)
AST: 21 U/L (ref 15–41)
Albumin: 3.7 g/dL (ref 3.5–5.0)
Alkaline Phosphatase: 59 U/L (ref 38–126)
Anion gap: 9 (ref 5–15)
BUN: 14 mg/dL (ref 8–23)
CO2: 26 mmol/L (ref 22–32)
Calcium: 9.6 mg/dL (ref 8.9–10.3)
Chloride: 105 mmol/L (ref 98–111)
Creatinine: 1.18 mg/dL (ref 0.61–1.24)
GFR, Est AFR Am: >60 mL/min (ref >60)
GFR, Estimated: 57 mL/min — ABNORMAL LOW (ref >60)
Glucose, Bld: 94 mg/dL (ref 70–99)
Potassium: 4.5 mmol/L (ref 3.5–5.1)
Sodium: 140 mmol/L (ref 135–145)
Total Bilirubin: 0.4 mg/dL (ref 0.3–1.2)
Total Protein: 7.2 g/dL (ref 6.5–8.1)

## 2020-01-29 LAB — RETIC PANEL
Immature Retic Fract: 12.6 % (ref 2.3–15.9)
RBC.: 3.89 MIL/uL — ABNORMAL LOW (ref 4.22–5.81)
Retic Count, Absolute: 47.1 10*3/uL (ref 19.0–186.0)
Retic Ct Pct: 1.2 % (ref 0.4–3.1)
Reticulocyte Hemoglobin: 34.7 pg (ref >27.9)

## 2020-02-09 ENCOUNTER — Other Ambulatory Visit: Payer: Self-pay | Admitting: Cardiology

## 2020-03-01 ENCOUNTER — Other Ambulatory Visit: Payer: Self-pay

## 2020-03-01 MED ORDER — CLOPIDOGREL BISULFATE 75 MG PO TABS: 75.0000 mg | ORAL_TABLET | Freq: Every morning | ORAL | 1 refills | Status: DC

## 2020-03-01 NOTE — Telephone Encounter (Signed)
Clopidogrel refill sent to [facility]

## 2020-03-27 DIAGNOSIS — D649 Anemia, unspecified: Secondary | ICD-10-CM | POA: Diagnosis not present

## 2020-03-27 DIAGNOSIS — I10 Essential (primary) hypertension: Secondary | ICD-10-CM | POA: Diagnosis not present

## 2020-03-27 DIAGNOSIS — I2510 Atherosclerotic heart disease of native coronary artery without angina pectoris: Secondary | ICD-10-CM | POA: Diagnosis not present

## 2020-03-27 DIAGNOSIS — N400 Enlarged prostate without lower urinary tract symptoms: Secondary | ICD-10-CM | POA: Diagnosis not present

## 2020-03-29 ENCOUNTER — Other Ambulatory Visit: Payer: Self-pay

## 2020-03-29 ENCOUNTER — Telehealth: Payer: Self-pay

## 2020-03-29 ENCOUNTER — Ambulatory Visit (HOSPITAL_BASED_OUTPATIENT_CLINIC_OR_DEPARTMENT_OTHER)
Admission: RE | Admit: 2020-03-29 | Discharge: 2020-03-29 | Disposition: A | Discharge status: Home / Self Care | Payer: Medicare HMO | Source: Ambulatory Visit | Attending: Cardiology | Admitting: Cardiology

## 2020-03-29 DIAGNOSIS — I25118 Atherosclerotic heart disease of native coronary artery with other forms of angina pectoris: Secondary | ICD-10-CM | POA: Diagnosis not present

## 2020-03-29 DIAGNOSIS — I119 Hypertensive heart disease without heart failure: Secondary | ICD-10-CM | POA: Insufficient documentation

## 2020-03-29 LAB — ECHOCARDIOGRAM COMPLETE
AR max vel: 0.8 cm2
AV Area VTI: 0.87 cm2
AV Area mean vel: 0.78 cm2
AV Mean grad: 22.5 mmHg
AV Peak grad: 40.6 mmHg
Ao pk vel: 3.19 m/s
Area-P 1/2: 3.48 cm2
P 1/2 time: 446 msec
S' Lateral: 2.93 cm

## 2020-03-29 NOTE — Telephone Encounter (Signed)
-----   Message from Richardo Priest, MD sent at 03/29/2020 10:12 AM EDT ----- I reviewed the echocardiogram he has had progression of his aortic stenosis is not severe yet..  I can review with him in detail at his next office follow-up

## 2020-03-29 NOTE — Telephone Encounter (Signed)
Spoke with patient regarding results and recommendation.  Patient verbalizes understanding and is agreeable to plan of care. Advised patient to call back with any issues or concerns.  

## 2020-04-12 DIAGNOSIS — N430 Encysted hydrocele: Secondary | ICD-10-CM | POA: Diagnosis not present

## 2020-04-12 DIAGNOSIS — N400 Enlarged prostate without lower urinary tract symptoms: Secondary | ICD-10-CM | POA: Diagnosis not present

## 2020-04-16 DIAGNOSIS — Z8739 Personal history of other diseases of the musculoskeletal system and connective tissue: Secondary | ICD-10-CM | POA: Insufficient documentation

## 2020-04-16 DIAGNOSIS — J189 Pneumonia, unspecified organism: Secondary | ICD-10-CM | POA: Insufficient documentation

## 2020-04-16 DIAGNOSIS — I2510 Atherosclerotic heart disease of native coronary artery without angina pectoris: Secondary | ICD-10-CM | POA: Insufficient documentation

## 2020-04-16 DIAGNOSIS — L409 Psoriasis, unspecified: Secondary | ICD-10-CM | POA: Insufficient documentation

## 2020-04-16 DIAGNOSIS — R200 Anesthesia of skin: Secondary | ICD-10-CM | POA: Insufficient documentation

## 2020-04-16 DIAGNOSIS — G5600 Carpal tunnel syndrome, unspecified upper limb: Secondary | ICD-10-CM | POA: Insufficient documentation

## 2020-04-16 DIAGNOSIS — Z87442 Personal history of urinary calculi: Secondary | ICD-10-CM | POA: Insufficient documentation

## 2020-04-16 DIAGNOSIS — R079 Chest pain, unspecified: Secondary | ICD-10-CM | POA: Insufficient documentation

## 2020-04-16 DIAGNOSIS — H269 Unspecified cataract: Secondary | ICD-10-CM | POA: Insufficient documentation

## 2020-04-16 DIAGNOSIS — N400 Enlarged prostate without lower urinary tract symptoms: Secondary | ICD-10-CM | POA: Insufficient documentation

## 2020-04-17 ENCOUNTER — Other Ambulatory Visit: Payer: Self-pay

## 2020-04-17 ENCOUNTER — Ambulatory Visit: Payer: Medicare HMO | Admitting: Cardiology

## 2020-04-17 ENCOUNTER — Encounter: Payer: Self-pay | Admitting: Cardiology

## 2020-04-17 VITALS — BP 100/52 | HR 68 | Ht 73.0 in | Wt 217.1 lb

## 2020-04-17 DIAGNOSIS — I119 Hypertensive heart disease without heart failure: Secondary | ICD-10-CM | POA: Diagnosis not present

## 2020-04-17 DIAGNOSIS — I350 Nonrheumatic aortic (valve) stenosis: Secondary | ICD-10-CM | POA: Diagnosis not present

## 2020-04-17 DIAGNOSIS — N1831 Chronic kidney disease, stage 3a: Secondary | ICD-10-CM

## 2020-04-17 DIAGNOSIS — I25118 Atherosclerotic heart disease of native coronary artery with other forms of angina pectoris: Secondary | ICD-10-CM | POA: Diagnosis not present

## 2020-04-17 DIAGNOSIS — I4892 Unspecified atrial flutter: Secondary | ICD-10-CM

## 2020-04-17 DIAGNOSIS — E785 Hyperlipidemia, unspecified: Secondary | ICD-10-CM

## 2020-04-17 NOTE — Progress Notes (Signed)
Cardiology Office Note:    Date:  04/17/2020   ID:  Steven [name], DOB [birth year], MRN [id number]  PCP:  Hulan Fess, MD  Cardiologist:  Shirlee More, MD    Referring MD: Hulan Fess, MD    ASSESSMENT:    1. Coronary artery disease of native artery of native heart with stable angina pectoris ([place])   2. Atrial flutter, unspecified type ([place])   3. Hypertensive heart disease without CHF   4. Hyperlipidemia, unspecified hyperlipidemia type   5. Nonrheumatic aortic valve stenosis   6. Stage 3a chronic kidney disease ([place])    PLAN:    In order of problems listed above:  1. Stable CAD New York Heart Association class I having no angina continue current treatment clopidogrel beta-blocker statin.  At this time I would not repeat an ischemia evaluation 2. Stable no recurrence after ablation 3. Blood pressure target repeat by me 120/60 continue current treatment 4. Continue statin check CMP lipid profile today 5. Despite physical exam suggesting more severe his aortic stenosis is mild 6. Stable CKD recheck renal function potassium today   Next appointment: 6 months   Medication Adjustments/Labs and Tests Ordered: Current medicines are reviewed at length with the patient today.  Concerns regarding medicines are outlined above.  Orders Placed This Encounter  Procedures  . Comprehensive metabolic panel  . Lipid panel   No orders of the defined types were placed in this encounter.   Chief Complaint  Patient presents with  . Follow-up  . Coronary Artery Disease    History of Present Illness:    Steven [name] is a 85 y.o. male with a hx of CAD with PCI and stent left anterior descending coronary artery 02/24/2018, mild aortic valve stenosis at catheterization August 2019, atrial flutter with EP ablation in September of  2017, hypertension, hyperlipidemia and obesity last seen 10/12/2019.  Compliance with diet, lifestyle and medications: Yes  I reviewed his  echocardiogram with him.  He is active continues to walk part-time and has had no angina edema shortness of breath palpitation or syncope and tolerates his statin without muscle pain or weakness.  His last lipid profile 08/14/2019 cholesterol 134 LDL 65 triglycerides 71 HDL 54.  Echo 10/12/2019:  1. Left ventricular ejection fraction, by estimation, is 60 to 65%. The  left ventricle has normal function. The left ventricle has no regional  wall motion abnormalities   2. The mitral valve is normal in structure. No evidence of mitral valve  regurgitation. No evidence of mitral stenosis.   3. The aortic valve was not well visualized. Aortic valve regurgitation  is mild. Mild aortic valve stenosis   Past Medical History:  Diagnosis Date  . Acute kidney injury superimposed on CKD ([place]) 06/27/2019  . Acute metabolic encephalopathy 3/47/4259  . Atrial flutter ([place])   . CAD (coronary artery disease)    02/24/18 PCI/DES x1 to the pLAD  . CAD (coronary artery disease), native coronary artery 04/11/2018   Cath 02/24/18 normal Left main, 80% stenosis proximal LAD, no significant disease RCA, 10 % stenosis CFX, mild aortic stenosis, [place] 3.5 x 51mm to prox LAD Dr. Irish Lack  . Carpal tunnel syndrome   . Cataract    left eye  . Chest pain   . CKD (chronic kidney disease), stage III ([place])   . Deficiency anemia 02/04/2015  . Demand ischemia ([place]) 01/24/2019  . Enlarged prostate   . History of gout   . History of kidney stones  pt has one now but not giving him any problems  . Hyperkalemia 06/27/2019  . Hyperlipidemia 04/11/2018  . Hypertensive heart disease without CHF 04/11/2018  . Hyperthermia associated with heat 01/24/2019  . Ileus, unspecified ([place]) 06/27/2019  . Leukocytopenia 03/11/2015  . Nonrheumatic aortic valve stenosis    ECHO 01/28/18  Mean gradient 16   . Numbness    both hands pt states pinched. 12-17-14 Gabapentin has improved.  . Obesity (BMI 30-39.9) 04/11/2018  . Pneumonia     history of pneumonia  . Psoriasis   . Sepsis ([place]) 01/24/2019  . UTI (urinary tract infection) 01/24/2019    Past Surgical History:  Procedure Laterality Date  . CATARACT EXTRACTION W/PHACO Left 03/15/2013   Procedure: CATARACT EXTRACTION PHACO AND INTRAOCULAR LENS PLACEMENT (IOC);  Surgeon: Adonis Brook, MD;  Location: [facility];  Service: Ophthalmology;  Laterality: Left;  . COLONOSCOPY    . COLONOSCOPY WITH PROPOFOL N/A 12/24/2014   Procedure: COLONOSCOPY WITH PROPOFOL;  Surgeon: Garlan Fair, MD;  [facility];  Service: Endoscopy;  Laterality: N/A;  . CORONARY STENT INTERVENTION N/A 02/24/2018   Procedure: CORONARY STENT INTERVENTION;  Surgeon: Jettie Booze, MD;  Location: [facility];  Service: Cardiovascular;  Laterality: N/A;  . ELECTROPHYSIOLOGIC STUDY N/A 02/28/2016   Procedure: A-Flutter Ablation;  Surgeon: Deboraha Sprang, MD;  Location: [facility];  Service: Cardiovascular;  Laterality: N/A;  . right cataract removed     . RIGHT/LEFT HEART CATH AND CORONARY ANGIOGRAPHY N/A 02/24/2018   Procedure: RIGHT/LEFT HEART CATH AND CORONARY ANGIOGRAPHY;  Surgeon: Jettie Booze, MD;  Location: [facility];  Service: Cardiovascular;  Laterality: N/A;  . TONSILLECTOMY     as child    Current Medications: Current Meds  Medication Sig  . ascorbic acid (VITAMIN C) 500 MG tablet Take 1 tablet (500 mg total) by mouth daily.  Marland Kitchen atorvastatin (LIPITOR) 40 MG tablet TAKE ONE TABLET BY MOUTH EVERY MORNING  . clopidogrel (PLAVIX) 75 MG tablet Take 1 tablet (75 mg total) by mouth every morning.  . Cyanocobalamin (VITAMIN B-12 PO) Take 1 tablet by mouth daily.  . indomethacin (INDOCIN) 25 MG capsule Take 25-50 mg by mouth every 6 (six) hours as needed (gout flareup).   . metoprolol succinate (TOPROL-XL) 25 MG 24 hr tablet Take 1 tablet (25 mg total) by mouth every morning.  . nitroGLYCERIN (NITROSTAT) 0.4 MG SL tablet Place 1 tablet (0.4 mg total) under the  tongue every 5 (five) minutes as needed.  . Omega-3 Fatty Acids (FISH OIL) 1000 MG CAPS Take 1,000 mg by mouth daily.  . tamsulosin (FLOMAX) 0.4 MG CAPS capsule Take 0.4 mg by mouth daily.     Allergies:   Patient has no known allergies.   Social History   Socioeconomic History  . Marital status: Widowed    Spouse name: Not on file  . Number of children: Not on file  . Years of education: Not on file  . Highest education level: Not on file  Occupational History  . Not on file  Tobacco Use  . Smoking status: Former Smoker    Types: Cigars  . Smokeless tobacco: Former Systems developer    Types: Chew  . Tobacco comment: smoked cigars 19yrs ago  Vaping Use  . Vaping Use: Never used  Substance and Sexual Activity  . Alcohol use: No  . Drug use: No  . Sexual activity: Never  Other Topics Concern  . Not on file  Social History Narrative  . Not on file   Social Determinants of Health   Financial Resource Strain:   . Difficulty of Paying Living Expenses: Not on file  Food Insecurity:   . Worried About Charity fundraiser in the Last Year: Not on file  . Ran Out of Food in the Last Year: Not on file  Transportation Needs:   . Lack of Transportation (Medical): Not on file  . Lack of Transportation (Non-Medical): Not on file  Physical Activity:   . Days of Exercise per Week: Not on file  . Minutes of Exercise per Session: Not on file  Stress:   . Feeling of Stress : Not on file  Social Connections:   . Frequency of Communication with Friends and Family: Not on file  . Frequency of Social Gatherings with Friends and Family: Not on file  . Attends Religious Services: Not on file  . Active Member of Clubs or Organizations: Not on file  . Attends Archivist Meetings: Not on file  . Marital Status: Not on file     Family History: The patient's family history includes Diabetes in his son; Healthy in his father; Kidney failure in his sister and sister; Other in his mother. There  is no history of Heart attack. ROS:   Please see the history of present illness.    All other systems reviewed and are negative.  EKGs/Labs/Other Studies Reviewed:    The following studies were reviewed today:   Recent Labs: 06/28/2019: B Natriuretic Peptide 30.0 01/29/2020: ALT 12; BUN 14; Creatinine 1.18; Hemoglobin 11.2; Platelet Count 150; Potassium 4.5; Sodium 140  Recent Lipid Panel    Component Value Date/Time   CHOL 119 03/02/2019 0945   TRIG 56 03/02/2019 0945   HDL 50 03/02/2019 0945   CHOLHDL 2.4 03/02/2019 0945   CHOLHDL 3.2 01/11/2018 0701   VLDL 17 01/11/2018 0701   LDLCALC 56 03/02/2019 0945    Physical Exam:    VS:  BP (!) 100/52   Pulse 68   Ht 6\' 1"  (1.854 m)   Wt 217 lb 1.3 oz (98.5 kg)   SpO2 96%   BMI 28.64 kg/m     Wt Readings from Last 3 Encounters:  04/17/20 217 lb 1.3 oz (98.5 kg)  01/29/20 (!) 227 lb 11.2 oz (103.3 kg)  10/12/19 226 lb 6.4 oz (102.7 kg)     GEN: Looks younger than his age well nourished, well developed in no acute distress HEENT: Normal NECK: No JVD; No carotid bruits LYMPHATICS: No lymphadenopathy CARDIAC: 1/6 to 2/6 midsystolic ejection murmur does not encompass S2 but does radiate to the base of the left carotid RRR, no  rubs, gallops RESPIRATORY:  Clear to auscultation without rales, wheezing or rhonchi  ABDOMEN: Soft, non-tender, non-distended MUSCULOSKELETAL:  No edema; No deformity  SKIN: Warm and dry NEUROLOGIC:  Alert and oriented x 3 PSYCHIATRIC:  Normal affect    Signed, Shirlee More, MD  04/17/2020 2:11 PM    [facility]

## 2020-04-17 NOTE — Patient Instructions (Signed)

## 2020-04-18 ENCOUNTER — Telehealth: Payer: Self-pay

## 2020-04-18 LAB — COMPREHENSIVE METABOLIC PANEL
ALT: 18 IU/L (ref 0–44)
AST: 29 IU/L (ref 0–40)
Albumin/Globulin Ratio: 1.4 (ref 1.2–2.2)
Albumin: 4 g/dL (ref 3.6–4.6)
Alkaline Phosphatase: 65 IU/L (ref 44–121)
BUN/Creatinine Ratio: 17 (ref 10–24)
BUN: 22 mg/dL (ref 8–27)
Bilirubin Total: 0.3 mg/dL (ref 0.0–1.2)
CO2: 24 mmol/L (ref 20–29)
Calcium: 8.9 mg/dL (ref 8.6–10.2)
Chloride: 104 mmol/L (ref 96–106)
Creatinine, Ser: 1.31 mg/dL — ABNORMAL HIGH (ref 0.76–1.27)
GFR calc Af Amer: 58 mL/min/{1.73_m2} — ABNORMAL LOW (ref >59)
GFR calc non Af Amer: 50 mL/min/{1.73_m2} — ABNORMAL LOW (ref >59)
Globulin, Total: 2.8 g/dL (ref 1.5–4.5)
Glucose: 96 mg/dL (ref 65–99)
Potassium: 4.8 mmol/L (ref 3.5–5.2)
Sodium: 140 mmol/L (ref 134–144)
Total Protein: 6.8 g/dL (ref 6.0–8.5)

## 2020-04-18 LAB — LIPID PANEL
Chol/HDL Ratio: 2.2 ratio (ref 0.0–5.0)
Cholesterol, Total: 98 mg/dL — ABNORMAL LOW (ref 100–199)
HDL: 45 mg/dL (ref >39)
LDL Chol Calc (NIH): 41 mg/dL (ref 0–99)
Triglycerides: 50 mg/dL (ref 0–149)
VLDL Cholesterol Cal: 12 mg/dL (ref 5–40)

## 2020-04-18 NOTE — Telephone Encounter (Signed)
-----   Message from Richardo Priest, MD sent at 04/18/2020  7:44 AM EDT ----- Stable no changes

## 2020-04-18 NOTE — Telephone Encounter (Signed)
Spoke with patient regarding results and recommendation.  Patient verbalizes understanding and is agreeable to plan of care. Advised patient to call back with any issues or concerns.  

## 2020-04-23 DIAGNOSIS — M542 Cervicalgia: Secondary | ICD-10-CM | POA: Diagnosis not present

## 2020-05-03 DIAGNOSIS — M47812 Spondylosis without myelopathy or radiculopathy, cervical region: Secondary | ICD-10-CM | POA: Diagnosis not present

## 2020-05-06 DIAGNOSIS — D649 Anemia, unspecified: Secondary | ICD-10-CM | POA: Diagnosis not present

## 2020-05-06 DIAGNOSIS — I10 Essential (primary) hypertension: Secondary | ICD-10-CM | POA: Diagnosis not present

## 2020-05-06 DIAGNOSIS — M47812 Spondylosis without myelopathy or radiculopathy, cervical region: Secondary | ICD-10-CM | POA: Diagnosis not present

## 2020-05-06 DIAGNOSIS — N401 Enlarged prostate with lower urinary tract symptoms: Secondary | ICD-10-CM | POA: Diagnosis not present

## 2020-05-06 DIAGNOSIS — I2510 Atherosclerotic heart disease of native coronary artery without angina pectoris: Secondary | ICD-10-CM | POA: Diagnosis not present

## 2020-05-08 ENCOUNTER — Telehealth: Payer: Self-pay | Admitting: Hematology and Oncology

## 2020-05-08 NOTE — Telephone Encounter (Signed)
Called pt per 11/09 sch msg - unable to reach pt and unable to leave message.

## 2020-05-10 ENCOUNTER — Inpatient Hospital Stay: Payer: Medicare HMO | Attending: Hematology and Oncology

## 2020-05-10 ENCOUNTER — Other Ambulatory Visit: Payer: Self-pay

## 2020-05-10 DIAGNOSIS — Z23 Encounter for immunization: Secondary | ICD-10-CM

## 2020-05-10 NOTE — Progress Notes (Signed)
Administered Pfizer COVID booster shot and observed for 15 minutes without any complain or concerns.

## 2020-05-13 ENCOUNTER — Other Ambulatory Visit: Payer: Self-pay

## 2020-05-13 ENCOUNTER — Ambulatory Visit: Payer: Medicare HMO | Attending: Sports Medicine

## 2020-05-13 DIAGNOSIS — R293 Abnormal posture: Secondary | ICD-10-CM | POA: Insufficient documentation

## 2020-05-13 DIAGNOSIS — M542 Cervicalgia: Secondary | ICD-10-CM | POA: Insufficient documentation

## 2020-05-13 DIAGNOSIS — R252 Cramp and spasm: Secondary | ICD-10-CM | POA: Diagnosis not present

## 2020-05-13 DIAGNOSIS — M436 Torticollis: Secondary | ICD-10-CM | POA: Insufficient documentation

## 2020-05-13 NOTE — Patient Instructions (Signed)
Cervical and scapula retraction 3-5 reps 5 sec hold 4-5x/day   Cervical rotation x 5  5 sec hold 4-5x/day RT /LT
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
## 2020-05-21 ENCOUNTER — Ambulatory Visit: Payer: Medicare HMO

## 2020-05-21 ENCOUNTER — Other Ambulatory Visit: Payer: Self-pay

## 2020-05-21 DIAGNOSIS — M436 Torticollis: Secondary | ICD-10-CM | POA: Diagnosis not present

## 2020-05-21 DIAGNOSIS — M542 Cervicalgia: Secondary | ICD-10-CM

## 2020-05-21 DIAGNOSIS — R293 Abnormal posture: Secondary | ICD-10-CM

## 2020-05-21 DIAGNOSIS — R252 Cramp and spasm: Secondary | ICD-10-CM

## 2020-05-21 NOTE — Therapy (Signed)
[place], [place], [address] Phone: [phone]   Fax:  [phone]  Physical Therapy Treatment  Patient Details  Name: Steven [name] MRN: [id number] Date of Birth: May 28, 1935 Referring Provider (PT): Inez Catalina, MD   Encounter Date: 05/21/2020   PT End of Session - 05/21/20 1127    Visit Number 2    Number of Visits 7    Date for PT Re-Evaluation 06/21/20    Authorization Type humana MCR    PT Start Time 1130    PT Stop Time 1200    PT Time Calculation (min) 30 min    Activity Tolerance Patient tolerated treatment well    Behavior During Therapy [facility] for tasks assessed/performed           Past Medical History:  Diagnosis Date   Acute kidney injury superimposed on CKD ([place]) 91/69/4503   Acute metabolic encephalopathy 8/88/2800   Atrial flutter (HCC)    CAD (coronary artery disease)    02/24/18 PCI/DES x1 to the pLAD   CAD (coronary artery disease), native coronary artery 04/11/2018   Cath 02/24/18 normal Left main, 80% stenosis proximal LAD, no significant disease RCA, 10 % stenosis CFX, mild aortic stenosis, [place] 3.5 x 50mm to prox LAD Dr. Irish Lack   Carpal tunnel syndrome    Cataract    left eye   Chest pain    CKD (chronic kidney disease), stage III ([place])    Deficiency anemia 02/04/2015   Demand ischemia ([place]) 01/24/2019   Enlarged prostate    History of gout    History of kidney stones    pt has one now but not giving him any problems   Hyperkalemia 06/27/2019   Hyperlipidemia 04/11/2018   Hypertensive heart disease without CHF 04/11/2018   Hyperthermia associated with heat 01/24/2019   Ileus, unspecified ([place]) 06/27/2019   Leukocytopenia 03/11/2015   Nonrheumatic aortic valve stenosis    ECHO 01/28/18  Mean gradient 16    Numbness    both hands pt states pinched. 12-17-14 Gabapentin has improved.   Obesity (BMI 30-39.9) 04/11/2018   Pneumonia    history of  pneumonia   Psoriasis    Sepsis ([place]) 01/24/2019   UTI (urinary tract infection) 01/24/2019    Past Surgical History:  Procedure Laterality Date   CATARACT EXTRACTION W/PHACO Left 03/15/2013   Procedure: CATARACT EXTRACTION PHACO AND INTRAOCULAR LENS PLACEMENT ([place]);  Surgeon: Adonis Brook, MD;  Location: [place];  Service: Ophthalmology;  Laterality: Left;   COLONOSCOPY     COLONOSCOPY WITH PROPOFOL N/A 12/24/2014   Procedure: COLONOSCOPY WITH PROPOFOL;  Surgeon: Garlan Fair, MD;  [facility];  Service: Endoscopy;  Laterality: N/A;   CORONARY STENT INTERVENTION N/A 02/24/2018   Procedure: CORONARY STENT INTERVENTION;  Surgeon: Jettie Booze, MD;  Location: [place] [facility];  Service: Cardiovascular;  Laterality: N/A;   ELECTROPHYSIOLOGIC STUDY N/A 02/28/2016   Procedure: A-Flutter Ablation;  Surgeon: Deboraha Sprang, MD;  Location: [place] [facility];  Service: Cardiovascular;  Laterality: N/A;   right cataract removed      RIGHT/LEFT HEART CATH AND CORONARY ANGIOGRAPHY N/A 02/24/2018   Procedure: RIGHT/LEFT HEART CATH AND CORONARY ANGIOGRAPHY;  Surgeon: Jettie Booze, MD;  Location: [place] [facility];  Service: Cardiovascular;  Laterality: N/A;   TONSILLECTOMY     as child    There were no vitals filed for this visit.   Subjective Assessment - 05/21/20 1130  Subjective No pain to start today.    Currently in Pain? No/denies              [facility] PT Assessment - 05/21/20 0001      AROM   Cervical - Right Rotation 60     Cervical - Left Rotation 55   60 post MWM                         [facility] Adult PT Treatment/Exercise - 05/21/20 0001      Exercises   Exercises Neck      Neck Exercises: Machines for Strengthening   UBE (Upper Arm Bike) L1 2 min forward and 2 back      Neck Exercises: Theraband   Shoulder Extension 15 reps;Green    Rows 15 reps;Green    Shoulder External Rotation 15 reps    Shoulder External Rotation  Limitations green          Manual central and lateal Gr 3 mobs aand rotational mobs all to cervical spine. MWM for rotation  To LT ,  STW to paraspinals and traps        PT Education - 05/21/20 1205    Education Details Band exercises.    Person(s) Educated Patient    Methods Explanation;Demonstration;Verbal cues;Handout    Comprehension Verbalized understanding;Returned demonstration            PT Short Term Goals - 05/13/20 1701      PT SHORT TERM GOAL #1   Title He will be indpendent with intial hEP    Time 2    Period Weeks    Status New             PT Long Term Goals - 05/13/20 1701      PT LONG TERM GOAL #1   Title He will be indpendent with all HEP issued    Time 7    Period Weeks    Status New      PT LONG TERM GOAL #2   Title He will improve FOTO to 72% or better.    Time 7    Period Weeks    Status New      PT LONG TERM GOAL #3   Title Hewill improve cervical rotation to 55-60 degrees    Time 7    Period Weeks    Status New      PT LONG TERM GOAL #4   Title He will report no pain with normal activity    Time 7    Period Weeks    Status New                 Plan - 05/21/20 1128    Clinical Impression Statement No pain today and he has not had any significant pain since last session. He was able todemo shoulder and cervical retraction correctly. His ROM was improved post treatment . Added band exercises for home. Will continue to work on ROM and strength.   Manual as needed    PT Treatment/Interventions Taping;Dry needling;Passive range of motion;Manual techniques;Therapeutic exercise;Ultrasound;Traction;Iontophoresis 4mg /ml Dexamethasone;Moist Heat;Patient/family education    PT Next Visit Plan REview HEP  add side bending , manual for ROM, modalities if needed    PT Home Exercise Plan Cervical and scpula retraction, cervical rotation,, and exer shoulder ER/EXTENSION/ ROW    Consulted and Agree with Plan of Care Patient            Patient  will benefit from skilled therapeutic intervention in order to improve the following deficits and impairments:  Pain, Decreased range of motion, Postural dysfunction  Visit Diagnosis: Cervicalgia  Stiffness of neck  Abnormal posture  Cramp and spasm     Problem List Patient Active Problem List   Diagnosis Date Noted   Psoriasis    Pneumonia    Numbness    History of kidney stones    History of gout    Enlarged prostate    Chest pain    Cataract    Carpal tunnel syndrome    CAD (coronary artery disease)    Hyperkalemia 06/27/2019   Ileus, unspecified ([place]) 06/27/2019   Acute kidney injury superimposed on CKD ([place]) 06/27/2019   UTI (urinary tract infection) 01/24/2019   Sepsis ([place]) 01/24/2019   Hyperthermia associated with heat 76/28/3151   Acute metabolic encephalopathy 76/16/0737   Demand ischemia ([place]) 01/24/2019   CAD (coronary artery disease), native coronary artery 04/11/2018   Hypertensive heart disease without CHF 04/11/2018   Hyperlipidemia 04/11/2018   Obesity (BMI 30-39.9) 04/11/2018   Nonrheumatic aortic valve stenosis    CKD (chronic kidney disease), stage III (HCC)    Atrial flutter ([place]) 02/28/2016   Leukocytopenia 03/11/2015   Deficiency anemia 02/04/2015    Darrel Hoover   PT 05/21/2020, 12:07 PM  [facility] [address] [place], [place], [address] Phone: [phone]   Fax:  [phone]  Name: Steven [name] MRN: [id number] Date of Birth: [birth year]

## 2020-05-21 NOTE — Patient Instructions (Signed)
Shoulder Row. ER. Extension x 10-20 reps daily  Bilateral green band

## 2020-05-28 ENCOUNTER — Encounter: Payer: Self-pay | Admitting: Physical Therapy

## 2020-05-28 ENCOUNTER — Other Ambulatory Visit: Payer: Self-pay

## 2020-05-28 ENCOUNTER — Ambulatory Visit: Payer: Medicare HMO | Admitting: Physical Therapy

## 2020-05-28 DIAGNOSIS — R293 Abnormal posture: Secondary | ICD-10-CM

## 2020-05-28 DIAGNOSIS — M542 Cervicalgia: Secondary | ICD-10-CM

## 2020-05-28 DIAGNOSIS — M436 Torticollis: Secondary | ICD-10-CM | POA: Diagnosis not present

## 2020-05-28 DIAGNOSIS — R252 Cramp and spasm: Secondary | ICD-10-CM

## 2020-05-28 NOTE — Patient Instructions (Addendum)
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
## 2020-05-31 DIAGNOSIS — M47812 Spondylosis without myelopathy or radiculopathy, cervical region: Secondary | ICD-10-CM | POA: Diagnosis not present

## 2020-07-08 IMAGING — DX PORTABLE CHEST - 1 VIEW
1 series · 1 of 1 positions shown · non-contrast
Comparison: April 01, 2018

CLINICAL DATA: Heat related illness

EXAM:
PORTABLE CHEST 1 VIEW

[chest ap]
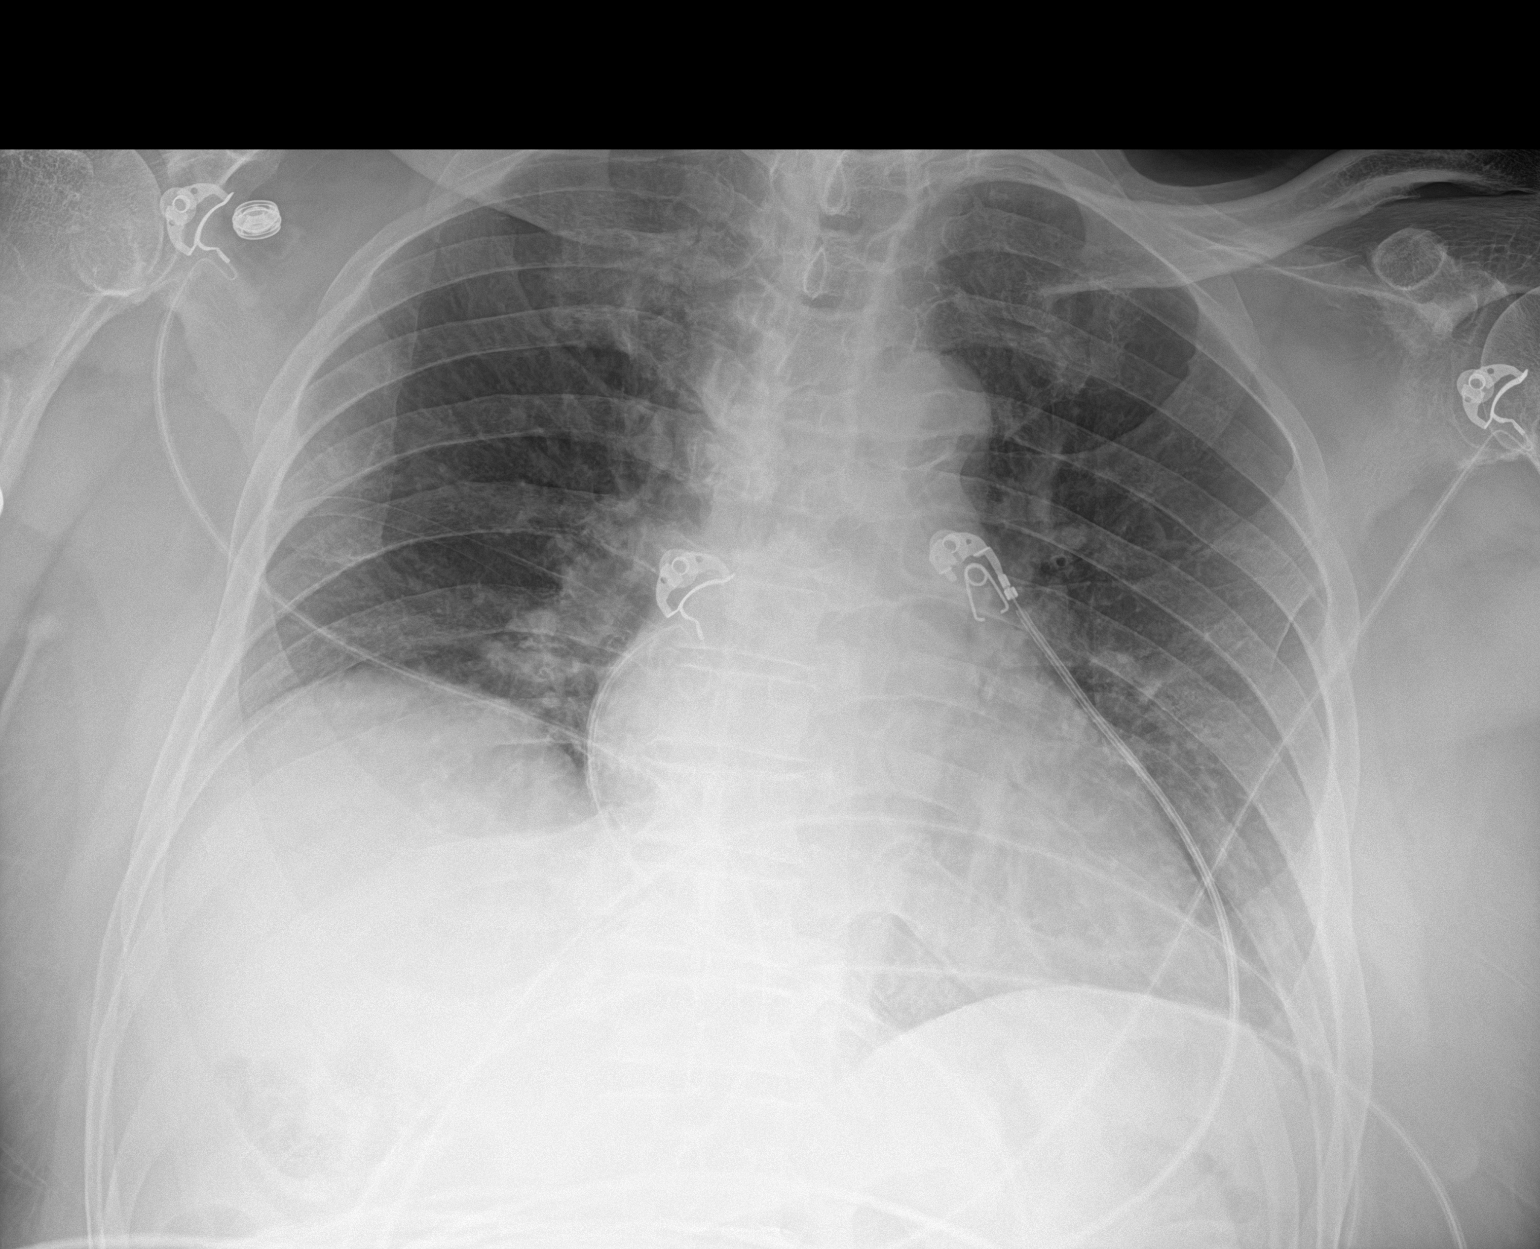

[1 of 1 positions shown; findings below may reference images not displayed]

FINDINGS: The lungs are clear without focal consolidation, edema, effusion or
pneumothorax. Cardiomediastinal silhouette is within normal limits
for size. Aortic knob calcifications are seen. No acute osseous
abnormality.
IMPRESSION: No acute cardiopulmonary process.

## 2020-07-10 ENCOUNTER — Other Ambulatory Visit: Payer: Self-pay | Admitting: Cardiology

## 2020-07-15 DIAGNOSIS — I10 Essential (primary) hypertension: Secondary | ICD-10-CM | POA: Diagnosis not present

## 2020-07-15 DIAGNOSIS — M47812 Spondylosis without myelopathy or radiculopathy, cervical region: Secondary | ICD-10-CM | POA: Diagnosis not present

## 2020-07-15 DIAGNOSIS — I2510 Atherosclerotic heart disease of native coronary artery without angina pectoris: Secondary | ICD-10-CM | POA: Diagnosis not present

## 2020-07-15 DIAGNOSIS — N401 Enlarged prostate with lower urinary tract symptoms: Secondary | ICD-10-CM | POA: Diagnosis not present

## 2020-07-15 DIAGNOSIS — D649 Anemia, unspecified: Secondary | ICD-10-CM | POA: Diagnosis not present

## 2020-08-06 DIAGNOSIS — H35033 Hypertensive retinopathy, bilateral: Secondary | ICD-10-CM | POA: Diagnosis not present

## 2020-08-06 DIAGNOSIS — H43813 Vitreous degeneration, bilateral: Secondary | ICD-10-CM | POA: Diagnosis not present

## 2020-08-06 DIAGNOSIS — H40013 Open angle with borderline findings, low risk, bilateral: Secondary | ICD-10-CM | POA: Diagnosis not present

## 2020-08-06 DIAGNOSIS — H57813 Brow ptosis, bilateral: Secondary | ICD-10-CM | POA: Diagnosis not present

## 2020-08-23 DIAGNOSIS — R0789 Other chest pain: Secondary | ICD-10-CM | POA: Diagnosis not present

## 2020-08-23 DIAGNOSIS — M546 Pain in thoracic spine: Secondary | ICD-10-CM | POA: Diagnosis not present

## 2020-08-30 DIAGNOSIS — N401 Enlarged prostate with lower urinary tract symptoms: Secondary | ICD-10-CM | POA: Diagnosis not present

## 2020-08-30 DIAGNOSIS — I10 Essential (primary) hypertension: Secondary | ICD-10-CM | POA: Diagnosis not present

## 2020-08-30 DIAGNOSIS — I2510 Atherosclerotic heart disease of native coronary artery without angina pectoris: Secondary | ICD-10-CM | POA: Diagnosis not present

## 2020-08-30 DIAGNOSIS — M47812 Spondylosis without myelopathy or radiculopathy, cervical region: Secondary | ICD-10-CM | POA: Diagnosis not present

## 2020-08-30 DIAGNOSIS — D649 Anemia, unspecified: Secondary | ICD-10-CM | POA: Diagnosis not present

## 2020-09-03 ENCOUNTER — Other Ambulatory Visit: Payer: Self-pay

## 2020-09-03 MED ORDER — ATORVASTATIN CALCIUM 40 MG PO TABS: ORAL_TABLET | ORAL | 3 refills | Status: DC

## 2020-09-03 NOTE — Telephone Encounter (Signed)
Refill for Atorvastatin 40 mg # 90 X 3 additional refills to [facility]

## 2020-10-10 DIAGNOSIS — N401 Enlarged prostate with lower urinary tract symptoms: Secondary | ICD-10-CM | POA: Diagnosis not present

## 2020-10-10 DIAGNOSIS — M109 Gout, unspecified: Secondary | ICD-10-CM | POA: Diagnosis not present

## 2020-10-10 DIAGNOSIS — Z125 Encounter for screening for malignant neoplasm of prostate: Secondary | ICD-10-CM | POA: Diagnosis not present

## 2020-10-10 DIAGNOSIS — Z0000 Encounter for general adult medical examination without abnormal findings: Secondary | ICD-10-CM | POA: Diagnosis not present

## 2020-10-10 DIAGNOSIS — E782 Mixed hyperlipidemia: Secondary | ICD-10-CM | POA: Diagnosis not present

## 2020-10-10 DIAGNOSIS — I10 Essential (primary) hypertension: Secondary | ICD-10-CM | POA: Diagnosis not present

## 2020-10-10 DIAGNOSIS — Z1389 Encounter for screening for other disorder: Secondary | ICD-10-CM | POA: Diagnosis not present

## 2020-10-10 DIAGNOSIS — E559 Vitamin D deficiency, unspecified: Secondary | ICD-10-CM | POA: Diagnosis not present

## 2020-11-02 NOTE — Progress Notes (Signed)
Cardiology Office Note:    Date:  11/04/2020   ID:  Steven [name], DOB 27-Mar-1935, MRN [id number]  PCP:  Kristen Loader, FNP  Cardiologist:  Shirlee More, MD    Referring MD: Hulan Fess, MD    ASSESSMENT:    1. Coronary artery disease of native artery of native heart with stable angina pectoris ([place])   2. Atrial flutter, unspecified type ([place])   3. Hypertensive heart disease without CHF   4. Mixed hyperlipidemia   5. Nonrheumatic aortic valve stenosis    PLAN:    In order of problems listed above:  1. Overall he is doing well having no angina on current medical therapy we will continue long-term clopidogrel aspirin low-dose beta-blocker and nitroglycerin as needed along with lipid-lowering treatment. 2. BP at target I think his lightheadedness is related to alpha-blocker its not severe enough to interfere with his care 3. Lipids are ideal high risk CAD continue with statin 4. Mild aortic stenosis will need a follow-up echocardiogram in about 2 years   Next appointment: 6 months   Medication Adjustments/Labs and Tests Ordered: Current medicines are reviewed at length with the patient today.  Concerns regarding medicines are outlined above.  Orders Placed This Encounter  Procedures  . EKG 12-Lead   No orders of the defined types were placed in this encounter.   Chief Complaint  Patient presents with  . Follow-up  . Coronary Artery Disease    History of Present Illness:    Steven [name] is a 86 y.o. male with a hx of CAD with PCI and stent to left anterior descending coronary artery August 2019 aortic stenosis mild August 2019 atrial flutter with EP ablation September 2017 hypertension and hyper lipidemia last seen 04/17/2020.  His echocardiogram in April 2021 showed mild aortic valve stenosis..  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  He is a vigorous active man no exertional chest pain shortness of breath palpitation or syncope.  He takes an alpha-blocker Flomax for BPH and at times he has mild orthostatic lightheadedness.  Repeat blood pressure checked by me 136/60  Recent labs 10/10/2020 cholesterol 128 LDL 54 triglycerides 59 HDL 53 hemoglobin 11.2 creatinine 1.27 Past Medical History:  Diagnosis Date  . Acute kidney injury superimposed on CKD ([place]) 06/27/2019  . Acute metabolic encephalopathy 01/08/4579  . Atrial flutter ([place])   . CAD (coronary artery disease)    02/24/18 PCI/DES x1 to the pLAD  . CAD (coronary artery disease), native coronary artery 04/11/2018   Cath 02/24/18 normal Left main, 80% stenosis proximal LAD, no significant disease RCA, 10 % stenosis CFX, mild aortic stenosis, Sierra 3.5 x 80mm to prox LAD Dr. Irish Lack  . Carpal tunnel syndrome   . Cataract    left eye  . Chest pain   . CKD (chronic kidney disease), stage III ([place])   . Deficiency anemia 02/04/2015  . Demand ischemia ([place]) 01/24/2019  . Enlarged prostate   . History of gout   . History of kidney stones    pt has one now but not giving him any problems  . Hyperkalemia 06/27/2019  . Hyperlipidemia 04/11/2018  . Hypertensive heart disease without CHF 04/11/2018  . Hyperthermia associated with heat 01/24/2019  . Ileus, unspecified ([place]) 06/27/2019  . Leukocytopenia 03/11/2015  .  Nonrheumatic aortic valve stenosis    ECHO 01/28/18  Mean gradient 16   . Numbness    both hands pt states pinched. 12-17-14 Gabapentin has improved.  . Obesity (BMI 30-39.9) 04/11/2018  . Pneumonia    history of pneumonia  . Psoriasis   . Sepsis ([place]) 01/24/2019  . UTI (urinary tract infection) 01/24/2019    Past Surgical History:  Procedure Laterality Date  . CATARACT EXTRACTION W/PHACO Left 03/15/2013   Procedure: CATARACT EXTRACTION PHACO AND INTRAOCULAR LENS PLACEMENT  (IOC);  Surgeon: Adonis Brook, MD;  Location: [place];  Service: Ophthalmology;  Laterality: Left;  . COLONOSCOPY    . COLONOSCOPY WITH PROPOFOL N/A 12/24/2014   Procedure: COLONOSCOPY WITH PROPOFOL;  Surgeon: Garlan Fair, MD;  [facility];  Service: Endoscopy;  Laterality: N/A;  . CORONARY STENT INTERVENTION N/A 02/24/2018   Procedure: CORONARY STENT INTERVENTION;  Surgeon: Jettie Booze, MD;  Location: [place] [facility];  Service: Cardiovascular;  Laterality: N/A;  . ELECTROPHYSIOLOGIC STUDY N/A 02/28/2016   Procedure: A-Flutter Ablation;  Surgeon: Deboraha Sprang, MD;  Location: [place] [facility];  Service: Cardiovascular;  Laterality: N/A;  . right cataract removed     . RIGHT/LEFT HEART CATH AND CORONARY ANGIOGRAPHY N/A 02/24/2018   Procedure: RIGHT/LEFT HEART CATH AND CORONARY ANGIOGRAPHY;  Surgeon: Jettie Booze, MD;  Location: [place] [facility];  Service: Cardiovascular;  Laterality: N/A;  . TONSILLECTOMY     as child    Current Medications: Current Meds  Medication Sig  . ascorbic acid (VITAMIN C) 500 MG tablet Take 1 tablet (500 mg total) by mouth daily.  Mar[place] Kitchen atorvastatin (LIPITOR) 40 MG tablet TAKE ONE TABLET BY MOUTH EVERY MORNING  . clopidogrel (PLAVIX) 75 MG tablet TAKE 1 TABLET (75 MG TOTAL) BY MOUTH EVERY MORNING.  . Cyanocobalamin (VITAMIN B-12 PO) Take 1 tablet by mouth daily.  . indomethacin (INDOCIN) 25 MG capsule Take 25-50 mg by mouth every 6 (six) hours as needed (gout flareup).   . metoprolol succinate (TOPROL-XL) 25 MG 24 hr tablet Take 1 tablet (25 mg total) by mouth every morning.  . nitroGLYCERIN (NITROSTAT) 0.4 MG SL tablet Place 1 tablet (0.4 mg total) under the tongue every 5 (five) minutes as needed.  . Omega-3 Fatty Acids (FISH OIL) 1000 MG CAPS Take 1,000 mg by mouth daily.  . tamsulosin (FLOMAX) 0.4 MG CAPS capsule Take 0.4 mg by mouth daily.     Allergies:   Patient has no known allergies.   Social History   Socioeconomic  History  . Marital status: Widowed    Spouse name: Not on file  . Number of children: Not on file  . Years of education: Not on file  . Highest education level: Not on file  Occupational History  . Not on file  Tobacco Use  . Smoking status: Former Smoker    Types: Cigars  . Smokeless tobacco: Former Systems developer    Types: Chew  . Tobacco comment: smoked cigars 15yrs ago  Vaping Use  . Vaping Use: Never used  Substance and Sexual Activity  . Alcohol use: No  . Drug use: No  . Sexual activity: Never  Other Topics Concern  . Not on file  Social History Narrative  . Not on file   Social Determinants of Health   Financial Resource Strain: Not on file  Food Insecurity: Not on file  Transportation Needs: Not on file  Physical Activity: Not on file  Stress: Not on file  Social Connections: Not on file     Family History: The patient's family history includes Diabetes in his son; Healthy in his father; Kidney failure in his sister and sister; Other in his mother. There is no history of Heart attack. ROS:   Please see the history of present illness.    All other systems reviewed and are negative.  EKGs/Labs/Other Studies Reviewed:    The following studies were reviewed today:  EKG:  EKG ordered today and personally reviewed.  The ekg ordered today demonstrates sinus rhythm to 4 bpm otherwise normal EKG  Recent Labs: 01/29/2020: Hemoglobin 11.2; Platelet Count 150 04/17/2020: ALT 18; BUN 22; Creatinine, Ser 1.31; Potassium 4.8; Sodium 140  Recent Lipid Panel    Component Value Date/Time   CHOL 98 (L) 04/17/2020 1412   TRIG 50 04/17/2020 1412   HDL 45 04/17/2020 1412   CHOLHDL 2.2 04/17/2020 1412   CHOLHDL 3.2 01/11/2018 0701   VLDL 17 01/11/2018 0701   LDLCALC 41 04/17/2020 1412    Physical Exam:    VS:  BP 136/60   Pulse (!) 54   Ht 6\' 1"  (1.854 m)   Wt 211 lb (95.7 kg)   SpO2 98%   BMI 27.84 kg/m     Wt Readings from Last 3 Encounters:  11/04/20 211 lb (95.7 kg)   04/17/20 217 lb 1.3 oz (98.5 kg)  01/29/20 (!) 227 lb 11.2 oz (103.3 kg)     GEN: Appears his age well nourished, well developed in no acute distress HEENT: Normal NECK: No JVD; No carotid bruits LYMPHATICS: No lymphadenopathy CARDIAC: He has a grade 1/6 to 2/6 systolic ejection murmur does not encompass S2 does not radiate to the carotids RRR, no  rubs, gallops RESPIRATORY:  Clear to auscultation without rales, wheezing or rhonchi  ABDOMEN: Soft, non-tender, non-distended MUSCULOSKELETAL:  No edema; No deformity  SKIN: Warm and dry NEUROLOGIC:  Alert and oriented x 3 PSYCHIATRIC:  Normal affect    Signed, Shirlee More, MD  11/04/2020 9:45 AM    [place]

## 2020-11-04 ENCOUNTER — Other Ambulatory Visit: Payer: Self-pay

## 2020-11-04 ENCOUNTER — Encounter: Payer: Self-pay | Admitting: Cardiology

## 2020-11-04 ENCOUNTER — Ambulatory Visit: Payer: Medicare HMO | Admitting: Cardiology

## 2020-11-04 VITALS — BP 136/60 | HR 54 | Ht 73.0 in | Wt 211.0 lb

## 2020-11-04 DIAGNOSIS — I119 Hypertensive heart disease without heart failure: Secondary | ICD-10-CM

## 2020-11-04 DIAGNOSIS — I350 Nonrheumatic aortic (valve) stenosis: Secondary | ICD-10-CM | POA: Diagnosis not present

## 2020-11-04 DIAGNOSIS — I25118 Atherosclerotic heart disease of native coronary artery with other forms of angina pectoris: Secondary | ICD-10-CM

## 2020-11-04 DIAGNOSIS — E782 Mixed hyperlipidemia: Secondary | ICD-10-CM | POA: Diagnosis not present

## 2020-11-04 DIAGNOSIS — I4892 Unspecified atrial flutter: Secondary | ICD-10-CM | POA: Diagnosis not present

## 2020-11-04 NOTE — Patient Instructions (Signed)

## 2020-11-19 DIAGNOSIS — E782 Mixed hyperlipidemia: Secondary | ICD-10-CM | POA: Diagnosis not present

## 2020-11-19 DIAGNOSIS — M47812 Spondylosis without myelopathy or radiculopathy, cervical region: Secondary | ICD-10-CM | POA: Diagnosis not present

## 2020-11-19 DIAGNOSIS — I10 Essential (primary) hypertension: Secondary | ICD-10-CM | POA: Diagnosis not present

## 2020-11-19 DIAGNOSIS — I2510 Atherosclerotic heart disease of native coronary artery without angina pectoris: Secondary | ICD-10-CM | POA: Diagnosis not present

## 2020-11-19 DIAGNOSIS — N401 Enlarged prostate with lower urinary tract symptoms: Secondary | ICD-10-CM | POA: Diagnosis not present

## 2020-11-19 DIAGNOSIS — D649 Anemia, unspecified: Secondary | ICD-10-CM | POA: Diagnosis not present

## 2020-12-03 DIAGNOSIS — Z03818 Encounter for observation for suspected exposure to other biological agents ruled out: Secondary | ICD-10-CM | POA: Diagnosis not present

## 2020-12-03 DIAGNOSIS — R059 Cough, unspecified: Secondary | ICD-10-CM | POA: Diagnosis not present

## 2020-12-03 DIAGNOSIS — R0981 Nasal congestion: Secondary | ICD-10-CM | POA: Diagnosis not present

## 2020-12-03 DIAGNOSIS — J019 Acute sinusitis, unspecified: Secondary | ICD-10-CM | POA: Diagnosis not present

## 2020-12-03 DIAGNOSIS — J40 Bronchitis, not specified as acute or chronic: Secondary | ICD-10-CM | POA: Diagnosis not present

## 2020-12-23 DIAGNOSIS — N401 Enlarged prostate with lower urinary tract symptoms: Secondary | ICD-10-CM | POA: Diagnosis not present

## 2020-12-23 DIAGNOSIS — D649 Anemia, unspecified: Secondary | ICD-10-CM | POA: Diagnosis not present

## 2020-12-23 DIAGNOSIS — E782 Mixed hyperlipidemia: Secondary | ICD-10-CM | POA: Diagnosis not present

## 2020-12-23 DIAGNOSIS — I10 Essential (primary) hypertension: Secondary | ICD-10-CM | POA: Diagnosis not present

## 2020-12-23 DIAGNOSIS — M47812 Spondylosis without myelopathy or radiculopathy, cervical region: Secondary | ICD-10-CM | POA: Diagnosis not present

## 2020-12-23 DIAGNOSIS — I2510 Atherosclerotic heart disease of native coronary artery without angina pectoris: Secondary | ICD-10-CM | POA: Diagnosis not present

## 2021-01-10 DIAGNOSIS — R202 Paresthesia of skin: Secondary | ICD-10-CM | POA: Diagnosis not present

## 2021-02-12 DIAGNOSIS — D649 Anemia, unspecified: Secondary | ICD-10-CM | POA: Diagnosis not present

## 2021-02-12 DIAGNOSIS — I10 Essential (primary) hypertension: Secondary | ICD-10-CM | POA: Diagnosis not present

## 2021-02-12 DIAGNOSIS — N401 Enlarged prostate with lower urinary tract symptoms: Secondary | ICD-10-CM | POA: Diagnosis not present

## 2021-02-12 DIAGNOSIS — M47812 Spondylosis without myelopathy or radiculopathy, cervical region: Secondary | ICD-10-CM | POA: Diagnosis not present

## 2021-02-12 DIAGNOSIS — E782 Mixed hyperlipidemia: Secondary | ICD-10-CM | POA: Diagnosis not present

## 2021-02-12 DIAGNOSIS — I2510 Atherosclerotic heart disease of native coronary artery without angina pectoris: Secondary | ICD-10-CM | POA: Diagnosis not present

## 2021-03-18 DIAGNOSIS — H04123 Dry eye syndrome of bilateral lacrimal glands: Secondary | ICD-10-CM | POA: Diagnosis not present

## 2021-03-18 DIAGNOSIS — H43813 Vitreous degeneration, bilateral: Secondary | ICD-10-CM | POA: Diagnosis not present

## 2021-03-18 DIAGNOSIS — H40013 Open angle with borderline findings, low risk, bilateral: Secondary | ICD-10-CM | POA: Diagnosis not present

## 2021-03-18 DIAGNOSIS — H35033 Hypertensive retinopathy, bilateral: Secondary | ICD-10-CM | POA: Diagnosis not present

## 2021-03-20 ENCOUNTER — Other Ambulatory Visit: Payer: Self-pay | Admitting: Cardiology

## 2021-04-10 DIAGNOSIS — N401 Enlarged prostate with lower urinary tract symptoms: Secondary | ICD-10-CM | POA: Diagnosis not present

## 2021-04-10 DIAGNOSIS — R350 Frequency of micturition: Secondary | ICD-10-CM | POA: Diagnosis not present

## 2021-04-28 ENCOUNTER — Other Ambulatory Visit: Payer: Self-pay

## 2021-04-28 ENCOUNTER — Telehealth: Payer: Self-pay | Admitting: Cardiology

## 2021-04-28 MED ORDER — METOPROLOL SUCCINATE ER 25 MG PO TB24: 25.0000 mg | ORAL_TABLET | Freq: Every morning | ORAL | 3 refills | Status: DC

## 2021-04-28 NOTE — Telephone Encounter (Signed)
*  STAT* If patient is at the pharmacy, call can be transferred to refill team.   1. Which medications need to be refilled? (please list name of each medication and dose if known) metoprolol succinate (TOPROL-XL) 25 MG 24 hr tablet  2. Which pharmacy/location (including street and city if local pharmacy) is medication to be sent to? [place]  3. Do they need a 30 day or 90 day supply? 90 day

## 2021-04-28 NOTE — Telephone Encounter (Signed)
Medication sent.

## 2021-05-06 NOTE — Progress Notes (Signed)
Cardiology Office Note:    Date:  05/07/2021   ID:  Steven [name], DOB 1934-11-27, MRN [id number]  PCP:  Kristen Loader, FNP  Cardiologist:  Shirlee More, MD    Referring MD: Kristen Loader, FNP    ASSESSMENT:    1. Chest pain, unspecified type   2. Coronary artery disease of native artery of native heart with stable angina pectoris ([place])   3. Nonrheumatic aortic valve stenosis   4. Hypertensive heart disease without CHF   5. Mixed hyperlipidemia   6. Stage 3a chronic kidney disease (H[facility])    PLAN:    In order of problems listed above:  Symptoms are atypical however he has known CAD and I will ask him undergo myocardial perfusion imaging and if abnormal especially at high risk markers would benefit from current angiography and revascularization.  Continue his current medical treatment including clopidogrel high intensity statin beta-blocker Recheck echocardiogram clinically think he has severe aortic stenosis Continue current treatment stable Continue statin check lipid profile Recheck renal function avoid angiography contrast dye if possible   Next appointment: 6 months   Medication Adjustments/Labs and Tests Ordered: Current medicines are reviewed at length with the patient today.  Concerns regarding medicines are outlined above.  Orders Placed This Encounter  Procedures   Comprehensive metabolic panel   Lipid panel   MYOCARDIAL PERFUSION IMAGING   EKG 12-Lead   ECHOCARDIOGRAM COMPLETE   No orders of the defined types were placed in this encounter.   Chief Complaint  Patient presents with   Follow-up   Coronary Artery Disease    History of Present Illness:    Steven [name] is a 86 y.o. male with a hx of CAD with PCI and stent to left anterior descending coronary artery August 2019 mild aortic stenosis mild August 2019 atrial flutter with EP ablation September 2017 hypertension and hyperlipidemia  last seen 11/04/2020.  Compliance with diet, lifestyle and  medications: Yes  Is a little bit uncertain he has been having aching in the left shoulder down to the left hand mostly between the elbow and shoulder some radiation into the anterior upper chest and wonders having recurrent angina.  Not really exertional in nature and resolve spontaneously 10 to 15 minutes and occurs frequently often daily.  When he had PCI and done he had 1 severe blockage in his LAD and he had mild aortic stenosis.  He is not having exertional shortness of breath chest pain or palpitation the symptoms are nonpositional in nature. Past Medical History:  Diagnosis Date   Acute kidney injury superimposed on CKD ([place]) 80/99/8338   Acute metabolic encephalopathy 2/50/5397   Atrial flutter ([facility])    CAD (coronary artery disease)    02/24/18 PCI/DES x1 to the pLAD   CAD (coronary artery disease), native coronary artery 04/11/2018   Cath 02/24/18 normal Left main, 80% stenosis proximal LAD, no significant disease RCA, 10 % stenosis CFX, mild aortic stenosis, Sierra 3.5 x 53mm to prox LAD Dr. Irish Lack   Carpal tunnel syndrome    Cataract    left eye   Chest pain    CKD (chronic kidney disease), stage III ([place])    Deficiency anemia 02/04/2015   Demand ischemia ([place]) 01/24/2019   Enlarged prostate    History of gout    History of kidney stones    pt has one now but not giving him any problems   Hyperkalemia 06/27/2019   Hyperlipidemia 04/11/2018   Hypertensive heart disease  without CHF 04/11/2018   Hyperthermia associated with heat 01/24/2019   Ileus, unspecified ([place]) 06/27/2019   Leukocytopenia 03/11/2015   Nonrheumatic aortic valve stenosis    ECHO 01/28/18  Mean gradient 16    Numbness    both hands pt states pinched. 12-17-14 Gabapentin has improved.   Obesity (BMI 30-39.9) 04/11/2018   Pneumonia    history of pneumonia   Psoriasis    Sepsis ([place]) 01/24/2019   UTI (urinary tract infection) 01/24/2019    Past Surgical History:  Procedure Laterality Date   CATARACT  EXTRACTION W/PHACO Left 03/15/2013   Procedure: CATARACT EXTRACTION PHACO AND INTRAOCULAR LENS PLACEMENT ([place]);  Surgeon: Adonis Brook, MD;  Location: [place];  Service: Ophthalmology;  Laterality: Left;   COLONOSCOPY     COLONOSCOPY WITH PROPOFOL N/A 12/24/2014   Procedure: COLONOSCOPY WITH PROPOFOL;  Surgeon: Garlan Fair, MD;  [facility];  Service: Endoscopy;  Laterality: N/A;   CORONARY STENT INTERVENTION N/A 02/24/2018   Procedure: CORONARY STENT INTERVENTION;  Surgeon: Jettie Booze, MD;  Location: [place] [facility];  Service: Cardiovascular;  Laterality: N/A;   ELECTROPHYSIOLOGIC STUDY N/A 02/28/2016   Procedure: A-Flutter Ablation;  Surgeon: Deboraha Sprang, MD;  Location: [place] [facility];  Service: Cardiovascular;  Laterality: N/A;   right cataract removed      RIGHT/LEFT HEART CATH AND CORONARY ANGIOGRAPHY N/A 02/24/2018   Procedure: RIGHT/LEFT HEART CATH AND CORONARY ANGIOGRAPHY;  Surgeon: Jettie Booze, MD;  Location: [place] [facility];  Service: Cardiovascular;  Laterality: N/A;   TONSILLECTOMY     as child    Current Medications: Current Meds  Medication Sig   ascorbic acid (VITAMIN C) 500 MG tablet Take 1 tablet (500 mg total) by mouth daily.   atorvastatin (LIPITOR) 40 MG tablet TAKE ONE TABLET BY MOUTH EVERY MORNING   Cholecalciferol (VITAMIN D3 ADULT GUMMIES) 25 MCG (1000 UT) CHEW Chew 25 mcg by mouth daily.   clopidogrel (PLAVIX) 75 MG tablet TAKE 1 TABLET (75 MG TOTAL) BY MOUTH EVERY MORNING.   Cyanocobalamin (VITAMIN B-12 PO) Take 1 tablet by mouth daily.   indomethacin (INDOCIN) 25 MG capsule Take 25-50 mg by mouth every 6 (six) hours as needed (gout flareup).    metoprolol succinate (TOPROL-XL) 25 MG 24 hr tablet Take 1 tablet (25 mg total) by mouth every morning.   nitroGLYCERIN (NITROSTAT) 0.4 MG SL tablet Place 1 tablet (0.4 mg total) under the tongue every 5 (five) minutes as needed.   Omega-3 Fatty Acids (FISH OIL) 1000 MG CAPS Take  1,000 mg by mouth daily.   RESTASIS 0.05 % ophthalmic emulsion Place 1 drop into both eyes 2 (two) times daily.   tamsulosin (FLOMAX) 0.4 MG CAPS capsule Take 0.4 mg by mouth daily.     Allergies:   Patient has no known allergies.   Social History   Socioeconomic History   Marital status: Widowed    Spouse name: Not on file   Number of children: Not on file   Years of education: Not on file   Highest education level: Not on file  Occupational History   Not on file  Tobacco Use   Smoking status: Former    Types: Cigars   Smokeless tobacco: Former    Types: Chew   Tobacco comments:    smoked cigars 64yrs ago  Vaping Use   Vaping Use: Never used  Substance and Sexual Activity   Alcohol use: No   Drug use: No  Sexual activity: Never  Other Topics Concern   Not on file  Social History Narrative   Not on file   Social Determinants of Health   Financial Resource Strain: Not on file  Food Insecurity: Not on file  Transportation Needs: Not on file  Physical Activity: Not on file  Stress: Not on file  Social Connections: Not on file     Family History: The patient's family history includes Diabetes in his son; Healthy in his father; Kidney failure in his sister and sister; Other in his mother. There is no history of Heart attack. ROS:   Please see the history of present illness.    All other systems reviewed and are negative.  EKGs/Labs/Other Studies Reviewed:    The following studies were reviewed today:  EKG:  EKG ordered today and personally reviewed.  The ekg ordered today demonstrates sinus rhythm and remains normal  Recent Labs: No results found for requested labs within last 8760 hours.  Recent Lipid Panel    Component Value Date/Time   CHOL 98 (L) 04/17/2020 1412   TRIG 50 04/17/2020 1412   HDL 45 04/17/2020 1412   CHOLHDL 2.2 04/17/2020 1412   CHOLHDL 3.2 01/11/2018 0701   VLDL 17 01/11/2018 0701   LDLCALC 41 04/17/2020 1412    Physical Exam:     VS:  BP 134/62 (BP Location: Right Arm, Patient Position: Sitting, Cuff Size: Normal)   Pulse 60   Ht 6\' 1"  (1.854 m)   Wt 214 lb (97.1 kg)   SpO2 94%   BMI 28.23 kg/m     Wt Readings from Last 3 Encounters:  05/07/21 214 lb (97.1 kg)  11/04/20 211 lb (95.7 kg)  04/17/20 217 lb 1.3 oz (98.5 kg)     GEN: He looks younger than his age well nourished, well developed in no acute distress HEENT: Normal NECK: No JVD; No carotid bruits LYMPHATICS: No lymphadenopathy CARDIAC: 2/6 to 3/6 murmur aortic stenosis does not encompass S2 no aortic regurgitation does not radiate to the carotids RRR, no murmurs, rubs, gallops RESPIRATORY:  Clear to auscultation without rales, wheezing or rhonchi  ABDOMEN: Soft, non-tender, non-distended MUSCULOSKELETAL:  No edema; No deformity  SKIN: Warm and dry NEUROLOGIC:  Alert and oriented x 3 PSYCHIATRIC:  Normal affect    Signed, Shirlee More, MD  05/07/2021 2:21 PM    [place]

## 2021-05-07 ENCOUNTER — Ambulatory Visit: Payer: Medicare HMO | Admitting: Cardiology

## 2021-05-07 ENCOUNTER — Encounter: Payer: Self-pay | Admitting: Cardiology

## 2021-05-07 ENCOUNTER — Other Ambulatory Visit: Payer: Self-pay

## 2021-05-07 VITALS — BP 134/62 | HR 60 | Ht 73.0 in | Wt 214.0 lb

## 2021-05-07 DIAGNOSIS — I25118 Atherosclerotic heart disease of native coronary artery with other forms of angina pectoris: Secondary | ICD-10-CM

## 2021-05-07 DIAGNOSIS — N1831 Chronic kidney disease, stage 3a: Secondary | ICD-10-CM

## 2021-05-07 DIAGNOSIS — I119 Hypertensive heart disease without heart failure: Secondary | ICD-10-CM

## 2021-05-07 DIAGNOSIS — I350 Nonrheumatic aortic (valve) stenosis: Secondary | ICD-10-CM

## 2021-05-07 DIAGNOSIS — E782 Mixed hyperlipidemia: Secondary | ICD-10-CM | POA: Diagnosis not present

## 2021-05-07 DIAGNOSIS — R079 Chest pain, unspecified: Secondary | ICD-10-CM

## 2021-05-07 NOTE — Patient Instructions (Addendum)
Medication Instructions:  Your physician recommends that you continue on your current medications as directed. Please refer to the Current Medication list given to you today.  *If you need a refill on your cardiac medications before your next appointment, please call your pharmacy*   Lab Work: Your physician recommends that you return for lab work in: [place], Lipids If you have labs (blood work) drawn today and your tests are completely normal, you will receive your results only by: [place] (if you have MyChart) OR A paper copy in the mail If you have any lab test that is abnormal or we need to change your treatment, we will call you to review the results.   Testing/Procedures: Your physician has requested that you have an echocardiogram. Echocardiography is a painless test that uses sound waves to create images of your heart. It provides your doctor with information about the size and shape of your heart and how well your heart's chambers and valves are working. This procedure takes approximately one hour. There are no restrictions for this procedure.   [facility] at [facility] [address], [place], [place] [address] Phone: [phone]    Please arrive 15 minutes prior to your appointment time for registration and insurance purposes.  The test will take approximately 3 to 4 hours to complete; you may bring reading material.  If someone comes with you to your appointment, they will need to remain in the main lobby due to limited space in the testing area. **If you are pregnant or breastfeeding, please notify the nuclear lab prior to your appointment**  How to prepare for your Myocardial Perfusion Test: Do not eat or drink 3 hours prior to your test, except you may have water. Do not consume products containing caffeine (regular or decaffeinated) 12 hours prior to your test. (ex: coffee, chocolate, sodas, tea). Do bring a list of  your current medications with you.  If not listed below, you may take your medications as normal. Do wear comfortable clothes (no dresses or overalls) and walking shoes, tennis shoes preferred (No heels or open toe shoes are allowed). Do NOT wear cologne, perfume, aftershave, or lotions (deodorant is allowed). If these instructions are not followed, your test will have to be rescheduled.  Please report to [address] for your test.  If you have questions or concerns about your appointment, you can call the Nuclear Lab at [phone].  If you cannot keep your appointment, please provide 24 hours notification to the Nuclear Lab, to avoid a possible $50 charge to your account.    Follow-Up: At [facility], you and your health needs are our priority.  As part of our continuing mission to provide you with exceptional heart care, we have created designated Provider Care Teams.  These Care Teams include your primary Cardiologist (physician) and Advanced Practice Providers (APPs -  Physician Assistants and Nurse Practitioners) who all work together to provide you with the care you need, when you need it.  We recommend signing up for the patient portal called "MyChart".  Sign up information is provided on this After Visit Summary.  MyChart is used to connect with patients for Virtual Visits (Telemedicine).  Patients are able to view lab/test results, encounter notes, upcoming appointments, etc.  Non-urgent messages can be sent to your provider as well.   To learn more about what you can do with MyChart, go to [email or web address].    Your next appointment:  6 month(s)  The format for your next appointment:   In Person  Provider:   Shirlee More, MD    Other Instructions

## 2021-05-08 ENCOUNTER — Telehealth (HOSPITAL_COMMUNITY): Payer: Self-pay | Admitting: *Deleted

## 2021-05-08 ENCOUNTER — Telehealth: Payer: Self-pay

## 2021-05-08 LAB — LIPID PANEL
Chol/HDL Ratio: 1.9 ratio (ref 0.0–5.0)
Cholesterol, Total: 120 mg/dL (ref 100–199)
HDL: 62 mg/dL (ref >39)
LDL Chol Calc (NIH): 46 mg/dL (ref 0–99)
Triglycerides: 52 mg/dL (ref 0–149)
VLDL Cholesterol Cal: 12 mg/dL (ref 5–40)

## 2021-05-08 LAB — COMPREHENSIVE METABOLIC PANEL
ALT: 27 IU/L (ref 0–44)
AST: 29 IU/L (ref 0–40)
Albumin/Globulin Ratio: 1.5 (ref 1.2–2.2)
Albumin: 4.2 g/dL (ref 3.6–4.6)
Alkaline Phosphatase: 71 IU/L (ref 44–121)
BUN/Creatinine Ratio: 16 (ref 10–24)
BUN: 19 mg/dL (ref 8–27)
Bilirubin Total: 0.3 mg/dL (ref 0.0–1.2)
CO2: 27 mmol/L (ref 20–29)
Calcium: 9.7 mg/dL (ref 8.6–10.2)
Chloride: 106 mmol/L (ref 96–106)
Creatinine, Ser: 1.21 mg/dL (ref 0.76–1.27)
Globulin, Total: 2.8 g/dL (ref 1.5–4.5)
Glucose: 79 mg/dL (ref 70–99)
Potassium: 4.8 mmol/L (ref 3.5–5.2)
Sodium: 145 mmol/L — ABNORMAL HIGH (ref 134–144)
Total Protein: 7 g/dL (ref 6.0–8.5)
eGFR: 59 mL/min/{1.73_m2} — ABNORMAL LOW (ref >59)

## 2021-05-08 NOTE — Telephone Encounter (Signed)
Spoke with patient regarding results and recommendation.  Patient verbalizes understanding and is agreeable to plan of care. Advised patient to call back with any issues or concerns.  

## 2021-05-08 NOTE — Telephone Encounter (Signed)
-----   Message from Richardo Priest, MD sent at 05/08/2021  8:17 AM EST ----- Normal or stable result  Good results kidney function continues to improve no changes

## 2021-05-08 NOTE — Addendum Note (Signed)
Addended by: Shirlee More on: 05/08/2021 08:45 AM   Modules accepted: Orders

## 2021-05-08 NOTE — Telephone Encounter (Signed)
Patient given detailed instructions per Myocardial Perfusion Study Information Sheet for the test on 05/09/21 at 7:30. Patient notified to arrive 15 minutes early and that it is imperative to arrive on time for appointment to keep from having the test rescheduled.  If you need to cancel or reschedule your appointment, please call the office within 24 hours of your appointment. . Patient verbalized understanding.Steven [name]

## 2021-05-09 ENCOUNTER — Ambulatory Visit (HOSPITAL_COMMUNITY): Payer: Medicare HMO | Attending: Internal Medicine

## 2021-05-09 ENCOUNTER — Other Ambulatory Visit: Payer: Self-pay

## 2021-05-09 DIAGNOSIS — R079 Chest pain, unspecified: Secondary | ICD-10-CM | POA: Diagnosis not present

## 2021-05-09 LAB — MYOCARDIAL PERFUSION IMAGING
Base ST Depression (mm): 0 mm
LV dias vol: 103 mL (ref 62–150)
LV sys vol: 41 mL
Nuc Stress EF: 61 %
Peak HR: 72 {beats}/min
Rest HR: 53 {beats}/min
Rest Nuclear Isotope Dose: 8.4 mCi
SDS: 0
SRS: 0
SSS: 0
ST Depression (mm): 0 mm
Stress Nuclear Isotope Dose: 27.6 mCi
TID: 1.02

## 2021-05-09 MED ORDER — REGADENOSON 0.4 MG/5ML IV SOLN
0.4000 mg | Freq: Once | INTRAVENOUS | Status: AC
  Administered 2021-05-09: 0.4 mg via INTRAVENOUS

## 2021-05-09 MED ORDER — TECHNETIUM TC 99M TETROFOSMIN IV KIT
8.4000 | PACK | Freq: Once | INTRAVENOUS | Status: AC | PRN
  Administered 2021-05-09: 8.4 via INTRAVENOUS
  Filled 2021-05-09: qty 9

## 2021-05-09 MED ORDER — TECHNETIUM TC 99M TETROFOSMIN IV KIT
27.6000 | PACK | Freq: Once | INTRAVENOUS | Status: AC | PRN
  Administered 2021-05-09: 27.6 via INTRAVENOUS
  Filled 2021-05-09: qty 28

## 2021-05-12 ENCOUNTER — Telehealth: Payer: Self-pay

## 2021-05-12 NOTE — Telephone Encounter (Signed)
Spoke with patient regarding results and recommendation.  Patient verbalizes understanding and is agreeable to plan of care. Advised patient to call back with any issues or concerns.  

## 2021-05-12 NOTE — Telephone Encounter (Signed)
-----   Message from Richardo Priest, MD sent at 05/12/2021  8:58 AM EST ----- Normal or stable result  Good result we did this because of some left shoulder discomfort does not appear to be due to blocked artery signs heart and advised him to discuss with his family doctor

## 2021-05-21 DIAGNOSIS — M47812 Spondylosis without myelopathy or radiculopathy, cervical region: Secondary | ICD-10-CM | POA: Diagnosis not present

## 2021-05-21 DIAGNOSIS — E782 Mixed hyperlipidemia: Secondary | ICD-10-CM | POA: Diagnosis not present

## 2021-05-21 DIAGNOSIS — N401 Enlarged prostate with lower urinary tract symptoms: Secondary | ICD-10-CM | POA: Diagnosis not present

## 2021-05-21 DIAGNOSIS — I10 Essential (primary) hypertension: Secondary | ICD-10-CM | POA: Diagnosis not present

## 2021-05-21 DIAGNOSIS — D649 Anemia, unspecified: Secondary | ICD-10-CM | POA: Diagnosis not present

## 2021-05-21 DIAGNOSIS — I2510 Atherosclerotic heart disease of native coronary artery without angina pectoris: Secondary | ICD-10-CM | POA: Diagnosis not present

## 2021-06-02 ENCOUNTER — Ambulatory Visit (HOSPITAL_BASED_OUTPATIENT_CLINIC_OR_DEPARTMENT_OTHER)
Admission: RE | Admit: 2021-06-02 | Discharge: 2021-06-02 | Disposition: A | Discharge status: Home / Self Care | Payer: Medicare HMO | Source: Ambulatory Visit | Attending: Cardiology | Admitting: Cardiology

## 2021-06-02 ENCOUNTER — Telehealth: Payer: Self-pay

## 2021-06-02 ENCOUNTER — Other Ambulatory Visit: Payer: Self-pay

## 2021-06-02 DIAGNOSIS — R079 Chest pain, unspecified: Secondary | ICD-10-CM

## 2021-06-02 LAB — ECHOCARDIOGRAM COMPLETE
AR max vel: 1.21 cm2
AV Area VTI: 1.54 cm2
AV Area mean vel: 1.04 cm2
AV Mean grad: 22.8 mmHg
AV Peak grad: 36.1 mmHg
Ao pk vel: 3.01 m/s
Area-P 1/2: 2.56 cm2
Calc EF: 62.1 %
P 1/2 time: 641 msec
S' Lateral: 2.8 cm
Single Plane A2C EF: 64.3 %
Single Plane A4C EF: 58 %

## 2021-06-02 NOTE — Telephone Encounter (Signed)
Spoke with patient regarding results and recommendation.  Patient verbalizes understanding and is agreeable to plan of care. Advised patient to call back with any issues or concerns.  

## 2021-06-02 NOTE — Telephone Encounter (Signed)
-----   Message from Richardo Priest, MD sent at 06/02/2021 12:19 PM EST ----- Good report, valve blockage is not severe Recheck 1 year

## 2021-07-07 DIAGNOSIS — R519 Headache, unspecified: Secondary | ICD-10-CM | POA: Diagnosis not present

## 2021-07-07 DIAGNOSIS — M109 Gout, unspecified: Secondary | ICD-10-CM | POA: Diagnosis not present

## 2021-07-07 DIAGNOSIS — Z6828 Body mass index (BMI) 28.0-28.9, adult: Secondary | ICD-10-CM | POA: Diagnosis not present

## 2021-08-28 ENCOUNTER — Telehealth: Payer: Self-pay | Admitting: Cardiology

## 2021-08-28 NOTE — Telephone Encounter (Signed)
?*  STAT* If patient is at the pharmacy, call can be transferred to refill team. ? ? ?1. Which medications need to be refilled? (please list name of each medication and dose if known)  Clopidogrel ? ?2. Which pharmacy/location (including street and city if local pharmacy) is medication to be sent to?[facility]  ? ?3. Do they need a 30 day or 90 day supply? 90 days and refills ? ?

## 2021-08-29 MED ORDER — CLOPIDOGREL BISULFATE 75 MG PO TABS: 75.0000 mg | ORAL_TABLET | Freq: Every morning | ORAL | 2 refills | Status: DC

## 2021-08-30 ENCOUNTER — Other Ambulatory Visit: Payer: Self-pay | Admitting: Cardiology

## 2021-09-05 DIAGNOSIS — I10 Essential (primary) hypertension: Secondary | ICD-10-CM | POA: Diagnosis not present

## 2021-09-05 DIAGNOSIS — E782 Mixed hyperlipidemia: Secondary | ICD-10-CM | POA: Diagnosis not present

## 2021-09-16 DIAGNOSIS — H35033 Hypertensive retinopathy, bilateral: Secondary | ICD-10-CM | POA: Diagnosis not present

## 2021-09-16 DIAGNOSIS — H40013 Open angle with borderline findings, low risk, bilateral: Secondary | ICD-10-CM | POA: Diagnosis not present

## 2021-09-16 DIAGNOSIS — H04123 Dry eye syndrome of bilateral lacrimal glands: Secondary | ICD-10-CM | POA: Diagnosis not present

## 2021-09-16 DIAGNOSIS — H43813 Vitreous degeneration, bilateral: Secondary | ICD-10-CM | POA: Diagnosis not present

## 2021-10-14 DIAGNOSIS — Z125 Encounter for screening for malignant neoplasm of prostate: Secondary | ICD-10-CM | POA: Diagnosis not present

## 2021-10-14 DIAGNOSIS — M109 Gout, unspecified: Secondary | ICD-10-CM | POA: Diagnosis not present

## 2021-10-14 DIAGNOSIS — Z1389 Encounter for screening for other disorder: Secondary | ICD-10-CM | POA: Diagnosis not present

## 2021-10-14 DIAGNOSIS — I10 Essential (primary) hypertension: Secondary | ICD-10-CM | POA: Diagnosis not present

## 2021-10-14 DIAGNOSIS — N401 Enlarged prostate with lower urinary tract symptoms: Secondary | ICD-10-CM | POA: Diagnosis not present

## 2021-10-14 DIAGNOSIS — E782 Mixed hyperlipidemia: Secondary | ICD-10-CM | POA: Diagnosis not present

## 2021-10-14 DIAGNOSIS — E559 Vitamin D deficiency, unspecified: Secondary | ICD-10-CM | POA: Diagnosis not present

## 2021-10-14 DIAGNOSIS — D649 Anemia, unspecified: Secondary | ICD-10-CM | POA: Diagnosis not present

## 2021-10-14 DIAGNOSIS — Z0000 Encounter for general adult medical examination without abnormal findings: Secondary | ICD-10-CM | POA: Diagnosis not present

## 2021-10-14 LAB — LAB REPORT - SCANNED: EGFR: 56

## 2021-10-30 DIAGNOSIS — I2510 Atherosclerotic heart disease of native coronary artery without angina pectoris: Secondary | ICD-10-CM | POA: Diagnosis not present

## 2021-10-30 DIAGNOSIS — N1831 Chronic kidney disease, stage 3a: Secondary | ICD-10-CM | POA: Diagnosis not present

## 2021-10-30 DIAGNOSIS — Z0000 Encounter for general adult medical examination without abnormal findings: Secondary | ICD-10-CM | POA: Diagnosis not present

## 2021-10-30 DIAGNOSIS — M542 Cervicalgia: Secondary | ICD-10-CM | POA: Diagnosis not present

## 2021-10-30 DIAGNOSIS — E782 Mixed hyperlipidemia: Secondary | ICD-10-CM | POA: Diagnosis not present

## 2021-11-18 ENCOUNTER — Other Ambulatory Visit: Payer: Self-pay

## 2021-11-18 DIAGNOSIS — I351 Nonrheumatic aortic (valve) insufficiency: Secondary | ICD-10-CM

## 2021-11-18 DIAGNOSIS — M109 Gout, unspecified: Secondary | ICD-10-CM

## 2021-11-18 DIAGNOSIS — R6884 Jaw pain: Secondary | ICD-10-CM

## 2021-11-18 DIAGNOSIS — R899 Unspecified abnormal finding in specimens from other organs, systems and tissues: Secondary | ICD-10-CM

## 2021-11-18 DIAGNOSIS — I499 Cardiac arrhythmia, unspecified: Secondary | ICD-10-CM | POA: Insufficient documentation

## 2021-11-18 DIAGNOSIS — N1831 Chronic kidney disease, stage 3a: Secondary | ICD-10-CM

## 2021-11-18 DIAGNOSIS — Z8679 Personal history of other diseases of the circulatory system: Secondary | ICD-10-CM

## 2021-11-18 DIAGNOSIS — I350 Nonrheumatic aortic (valve) stenosis: Secondary | ICD-10-CM

## 2021-11-18 DIAGNOSIS — E782 Mixed hyperlipidemia: Secondary | ICD-10-CM

## 2021-11-18 DIAGNOSIS — I10 Essential (primary) hypertension: Secondary | ICD-10-CM

## 2021-11-18 DIAGNOSIS — M26629 Arthralgia of temporomandibular joint, unspecified side: Secondary | ICD-10-CM

## 2021-11-18 DIAGNOSIS — M47812 Spondylosis without myelopathy or radiculopathy, cervical region: Secondary | ICD-10-CM

## 2021-11-18 DIAGNOSIS — U071 COVID-19: Secondary | ICD-10-CM | POA: Insufficient documentation

## 2021-11-18 DIAGNOSIS — N135 Crossing vessel and stricture of ureter without hydronephrosis: Secondary | ICD-10-CM | POA: Insufficient documentation

## 2021-11-18 DIAGNOSIS — R202 Paresthesia of skin: Secondary | ICD-10-CM | POA: Insufficient documentation

## 2021-11-18 DIAGNOSIS — M5412 Radiculopathy, cervical region: Secondary | ICD-10-CM

## 2021-11-18 DIAGNOSIS — E559 Vitamin D deficiency, unspecified: Secondary | ICD-10-CM

## 2021-11-18 DIAGNOSIS — N401 Enlarged prostate with lower urinary tract symptoms: Secondary | ICD-10-CM

## 2021-11-18 DIAGNOSIS — I2510 Atherosclerotic heart disease of native coronary artery without angina pectoris: Secondary | ICD-10-CM

## 2021-11-18 HISTORY — DX: Chronic kidney disease, stage 3a: N18.31

## 2021-11-18 HISTORY — DX: Personal history of other diseases of the circulatory system: Z86.79

## 2021-11-18 HISTORY — DX: Arthralgia of temporomandibular joint, unspecified side: M26.629

## 2021-11-18 HISTORY — DX: Unspecified abnormal finding in specimens from other organs, systems and tissues: R89.9

## 2021-11-18 HISTORY — DX: Essential (primary) hypertension: I10

## 2021-11-18 HISTORY — DX: Radiculopathy, cervical region: M54.12

## 2021-11-18 HISTORY — DX: Jaw pain: R68.84

## 2021-11-18 HISTORY — DX: Paresthesia of skin: R20.2

## 2021-11-18 HISTORY — DX: COVID-19: U07.1

## 2021-11-18 HISTORY — DX: Crossing vessel and stricture of ureter without hydronephrosis: N13.5

## 2021-11-18 HISTORY — DX: Gout, unspecified: M10.9

## 2021-11-18 HISTORY — DX: Spondylosis without myelopathy or radiculopathy, cervical region: M47.812

## 2021-11-18 HISTORY — DX: Nonrheumatic aortic (valve) insufficiency: I35.1

## 2021-11-18 HISTORY — DX: Vitamin D deficiency, unspecified: E55.9

## 2021-11-18 HISTORY — DX: Cardiac arrhythmia, unspecified: I49.9

## 2021-11-18 HISTORY — DX: Benign prostatic hyperplasia with lower urinary tract symptoms: N40.1

## 2021-11-18 HISTORY — DX: Mixed hyperlipidemia: E78.2

## 2021-11-18 HISTORY — DX: Atherosclerotic heart disease of native coronary artery without angina pectoris: I25.10

## 2021-11-24 NOTE — Progress Notes (Unsigned)
Cardiology Office Note:    Date:  11/25/2021   ID:  Daesean Lazarz Vital, DOB [birth year], MRN [id number]  PCP:  Kristen Loader, FNP  Cardiologist:  Shirlee More, MD    Referring MD: Kristen Loader, FNP    ASSESSMENT:    1. Coronary artery disease of native artery of native heart with stable angina pectoris ([place])   2. Nonrheumatic aortic valve stenosis   3. Hypertensive heart disease without CHF   4. Mixed hyperlipidemia   5. Stage 3a chronic kidney disease (HCC)    PLAN:    In order of problems listed above:  Doing well with CAD following PCI having no angina on current therapy and continue clopidogrel high intensity statin beta-blocker Stable as aortic stenosis has not progressed BP at target continue current treatment LDL at target continue with statin Stable CKD   Next appointment: 6 months   Medication Adjustments/Labs and Tests Ordered: Current medicines are reviewed at length with the patient today.  Concerns regarding medicines are outlined above.  No orders of the defined types were placed in this encounter.  No orders of the defined types were placed in this encounter.   Chief complaint follow-up for CAD he has had both myocardial perfusion study and echocardiogram done in the interim   History of Present Illness:    Sherard Sutch Rahimi is a 87 y.o. male with a hx of CAD with PCI and stent LAD August 2019 aortic stenosis and regurgitation atrial flutter with EP ablation September 2017 hypertension hyperlipidemia last seen 05/07/2026 with left shoulder and left hand discomfort raising concern on his part for recurrent cardiac ischemia..  Compliance with diet, lifestyle and medications: Yes  Continues to have nighttime tingling in his left hand suggestive of carpal tunnel syndrome He continues to work and has no exercise intolerance edema chest pain shortness of breath palpitation or syncope Recent labs 10/14/2021 LDL 63 cholesterol 128 creatinine 1.25 hemoglobin  12.0  He had an echocardiogram performed 06/02/2021.  He had mild concentric LVH grade 1 diastolic dysfunction EF 60 to 65% normal right ventricular size and function aortic valve disease was present with mild regurgitation and moderate aortic stenosis mean gradient 23 mmHg aortic valve area 1.54 cm. FINDINGS   Left Ventricle: Left ventricular ejection fraction, by estimation, is 60  to 65%. The left ventricle has normal function. The left ventricle has no  regional wall motion abnormalities. The left ventricular internal cavity  size was normal in size. There is   mild concentric left ventricular hypertrophy. Left ventricular diastolic  parameters are consistent with Grade I diastolic dysfunction (impaired  relaxation). Normal left ventricular filling pressure.   And myocardial perfusion study performed 05/09/2021 there is no ST abnormality noted study was low risk normal left ventricular perfusion ejection fraction 61%. Study Highlights   The study is normal. The study is low risk.   No ST deviation was noted.   LV perfusion is normal. There is no evidence of ischemia. There is no evidence of infarction.   Left ventricular function is normal. Nuclear stress EF: 61 %. The left ventricular ejection fraction is normal (55-65%). End diastolic cavity size is normal. End systolic cavity size is normal.   Prior study not available for comparison. Past Medical History:  Diagnosis Date   Acute kidney injury superimposed on CKD ([place]) 62/26/3335   Acute metabolic encephalopathy 4/56/2563   Aortic ejection murmur 11/18/2021   Atherosclerotic heart disease of native coronary artery without angina pectoris 11/18/2021  Atrial flutter (HCC)    Benign prostatic hyperplasia with lower urinary tract symptoms 11/18/2021   CAD (coronary artery disease)    02/24/18 PCI/DES x1 to the pLAD   CAD (coronary artery disease), native coronary artery 04/11/2018   Cath 02/24/18 normal Left main, 80% stenosis proximal  LAD, no significant disease RCA, 10 % stenosis CFX, mild aortic stenosis, [place] 3.5 x 68m to prox LAD Dr. VIrish Lack  Carpal tunnel syndrome    Cataract    left eye   Cervical radiculopathy 11/18/2021   Cervical spondylosis without myelopathy 11/18/2021   Chest pain    Chronic kidney disease, stage 3a ([place] 11/18/2021   CKD (chronic kidney disease), stage III ([place]    COVID-19 11/18/2021   Deficiency anemia 02/04/2015   Demand ischemia ([place] 01/24/2019   Enlarged prostate    Essential hypertension 11/18/2021   Gout 11/18/2021   History of atrial flutter 11/18/2021   History of gout    History of kidney stones    pt has one now but not giving him any problems   Hyperkalemia 06/27/2019   Hyperlipidemia 04/11/2018   Hypertensive heart disease without CHF 04/11/2018   Hyperthermia associated with heat 01/24/2019   Ileus, unspecified ([place] 06/27/2019   Irregular heart beat 11/18/2021   Jaw pain 11/18/2021   Leukocytopenia 03/11/2015   Mixed hyperlipidemia 11/18/2021   Nonrheumatic aortic valve stenosis    ECHO 01/28/18  Mean gradient 16    Numbness    both hands pt states pinched. 12-17-14 Gabapentin has improved.   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   Unspecified abnormal finding in specimens from other organs, systems and tissues 11/18/2021   UTI (urinary tract infection) 01/24/2019   Vitamin D deficiency 11/18/2021    Past Surgical History:  Procedure Laterality Date   CATARACT EXTRACTION W/PHACO Left 03/15/2013   Procedure: CATARACT EXTRACTION PHACO AND INTRAOCULAR LENS PLACEMENT ([place];  Surgeon: GAdonis Brook MD;  [facility]  Service: Ophthalmology;  Laterality: Left;   COLONOSCOPY     COLONOSCOPY WITH PROPOFOL N/A 12/24/2014   Procedure: COLONOSCOPY WITH PROPOFOL;  Surgeon: MGarlan Fair  MD;  Location: [facility];  Service: Endoscopy;  Laterality: N/A;   CORONARY STENT INTERVENTION N/A 02/24/2018   Procedure: CORONARY STENT INTERVENTION;  Surgeon: VJettie Booze MD;  [facility];  Service: Cardiovascular;  Laterality: N/A;   ELECTROPHYSIOLOGIC STUDY N/A 02/28/2016   Procedure: A-Flutter Ablation;  Surgeon: SDeboraha Sprang MD;  [facility];  Service: Cardiovascular;  Laterality: N/A;   right cataract removed      RIGHT/LEFT HEART CATH AND CORONARY ANGIOGRAPHY N/A 02/24/2018   Procedure: RIGHT/LEFT HEART CATH AND CORONARY ANGIOGRAPHY;  Surgeon: VJettie Booze MD;  [facility];  Service: Cardiovascular;  Laterality: N/A;   TONSILLECTOMY     as child    Current Medications: Current Meds  Medication Sig   ascorbic acid (VITAMIN C) 500 MG tablet Take 1 tablet (500 mg total) by mouth daily.   atorvastatin (LIPITOR) 40 MG tablet Take 1 tablet (40 mg total) by mouth daily.   Cholecalciferol (VITAMIN D3 ADULT GUMMIES) 25 MCG (1000 UT) CHEW Chew 25 mcg by mouth daily.   clopidogrel (PLAVIX) 75 MG tablet Take 1 tablet (75 mg total) by mouth every  morning.   Cyanocobalamin (VITAMIN B-12 PO) Take 1 tablet by mouth daily.   indomethacin (INDOCIN) 25 MG capsule Take 25-50 mg by mouth every 6 (six) hours as needed (gout flareup).    metoprolol succinate (TOPROL-XL) 25 MG 24 hr tablet Take 1 tablet (25 mg total) by mouth every morning.   nitroGLYCERIN (NITROSTAT) 0.4 MG SL tablet Place 0.4 mg under the tongue every 5 (five) minutes as needed for chest pain.   Omega-3 Fatty Acids (FISH OIL) 1000 MG CAPS Take 1,000 mg by mouth daily.   RESTASIS 0.05 % ophthalmic emulsion Place 1 drop into both eyes 2 (two) times daily.   tamsulosin (FLOMAX) 0.4 MG CAPS capsule Take 0.4 mg by mouth daily.     Allergies:   Patient has no known allergies.   Social History   Socioeconomic History   Marital status: Widowed    Spouse name: Not on  file   Number of children: Not on file   Years of education: Not on file   Highest education level: Not on file  Occupational History   Not on file  Tobacco Use   Smoking status: Former    Types: Cigars   Smokeless tobacco: Former    Types: Chew   Tobacco comments:    smoked cigars 102yr ago  Vaping Use   Vaping Use: Never used  Substance and Sexual Activity   Alcohol use: No   Drug use: No   Sexual activity: Never  Other Topics Concern   Not on file  Social History Narrative   Not on file   Social Determinants of Health   Financial Resource Strain: Not on file  Food Insecurity: Not on file  Transportation Needs: Not on file  Physical Activity: Not on file  Stress: Not on file  Social Connections: Not on file     Family History: The patient's family history includes Diabetes in his son; Healthy in his father; Kidney failure in his sister and sister; Other in his mother. There is no history of Heart attack. ROS:   Please see the history of present illness.    All other systems reviewed and are negative.  EKGs/Labs/Other Studies Reviewed:    The following studies were reviewed today:    Recent Labs: 05/07/2021: ALT 27; BUN 19; Creatinine, Ser 1.21; Potassium 4.8; Sodium 145  Recent Lipid Panel    Component Value Date/Time   CHOL 120 05/07/2021 1449   TRIG 52 05/07/2021 1449   HDL 62 05/07/2021 1449   CHOLHDL 1.9 05/07/2021 1449   CHOLHDL 3.2 01/11/2018 0701   VLDL 17 01/11/2018 0701   LDLCALC 46 05/07/2021 1449    Physical Exam:    VS:  BP (!) 160/64   Pulse 80   Ht '6\' 1"'$  (1.854 m)   Wt 215 lb 0.6 oz (97.5 kg)   SpO2 94%   BMI 28.37 kg/m     Wt Readings from Last 3 Encounters:  11/25/21 215 lb 0.6 oz (97.5 kg)  05/09/21 214 lb (97.1 kg)  05/07/21 214 lb (97.1 kg)     GEN: Looks his age well nourished, well developed in no acute distress HEENT: Normal NECK: No JVD; No carotid bruits LYMPHATICS: No lymphadenopathy CARDIAC: RRR, no murmurs,  rubs, gallops RESPIRATORY:  Clear to auscultation without rales, wheezing or rhonchi  ABDOMEN: Soft, non-tender, non-distended MUSCULOSKELETAL:  No edema; No deformity  SKIN: Warm and dry NEUROLOGIC:  Alert and oriented x 3 PSYCHIATRIC:  Normal affect    Signed,  Shirlee More, MD  11/25/2021 2:09 PM    [facility]

## 2021-11-25 ENCOUNTER — Encounter: Payer: Self-pay | Admitting: Cardiology

## 2021-11-25 ENCOUNTER — Ambulatory Visit: Payer: Medicare HMO | Admitting: Cardiology

## 2021-11-25 VITALS — BP 160/64 | HR 80 | Ht 73.0 in | Wt 215.0 lb

## 2021-11-25 DIAGNOSIS — I350 Nonrheumatic aortic (valve) stenosis: Secondary | ICD-10-CM

## 2021-11-25 DIAGNOSIS — E782 Mixed hyperlipidemia: Secondary | ICD-10-CM

## 2021-11-25 DIAGNOSIS — N1831 Chronic kidney disease, stage 3a: Secondary | ICD-10-CM

## 2021-11-25 DIAGNOSIS — I119 Hypertensive heart disease without heart failure: Secondary | ICD-10-CM

## 2021-11-25 DIAGNOSIS — I25118 Atherosclerotic heart disease of native coronary artery with other forms of angina pectoris: Secondary | ICD-10-CM

## 2021-11-25 NOTE — Patient Instructions (Signed)

## 2022-02-14 ENCOUNTER — Other Ambulatory Visit: Payer: Self-pay | Admitting: Cardiology

## 2022-04-29 ENCOUNTER — Other Ambulatory Visit: Payer: Self-pay | Admitting: Cardiology

## 2022-05-19 DIAGNOSIS — H43813 Vitreous degeneration, bilateral: Secondary | ICD-10-CM | POA: Diagnosis not present

## 2022-05-19 DIAGNOSIS — H40013 Open angle with borderline findings, low risk, bilateral: Secondary | ICD-10-CM | POA: Diagnosis not present

## 2022-05-19 DIAGNOSIS — H04123 Dry eye syndrome of bilateral lacrimal glands: Secondary | ICD-10-CM | POA: Diagnosis not present

## 2022-05-19 DIAGNOSIS — H35033 Hypertensive retinopathy, bilateral: Secondary | ICD-10-CM | POA: Diagnosis not present

## 2022-05-26 DIAGNOSIS — N401 Enlarged prostate with lower urinary tract symptoms: Secondary | ICD-10-CM | POA: Diagnosis not present

## 2022-05-26 DIAGNOSIS — R350 Frequency of micturition: Secondary | ICD-10-CM | POA: Diagnosis not present

## 2022-07-01 DIAGNOSIS — J069 Acute upper respiratory infection, unspecified: Secondary | ICD-10-CM | POA: Diagnosis not present

## 2022-07-01 DIAGNOSIS — Z6830 Body mass index (BMI) 30.0-30.9, adult: Secondary | ICD-10-CM | POA: Diagnosis not present

## 2022-08-19 ENCOUNTER — Other Ambulatory Visit: Payer: Self-pay

## 2022-08-25 ENCOUNTER — Ambulatory Visit: Payer: Medicare HMO | Admitting: Cardiology

## 2022-08-27 DIAGNOSIS — M7918 Myalgia, other site: Secondary | ICD-10-CM | POA: Diagnosis not present

## 2022-08-27 DIAGNOSIS — N1831 Chronic kidney disease, stage 3a: Secondary | ICD-10-CM | POA: Diagnosis not present

## 2022-09-17 ENCOUNTER — Encounter: Payer: Self-pay | Admitting: Cardiology

## 2022-09-17 ENCOUNTER — Telehealth: Payer: Self-pay | Admitting: Cardiology

## 2022-09-17 ENCOUNTER — Ambulatory Visit: Payer: Medicare HMO | Attending: Cardiology | Admitting: Cardiology

## 2022-09-17 ENCOUNTER — Telehealth (HOSPITAL_COMMUNITY): Payer: Self-pay | Admitting: *Deleted

## 2022-09-17 VITALS — BP 148/62 | HR 70 | Ht 72.0 in | Wt 221.0 lb

## 2022-09-17 DIAGNOSIS — I10 Essential (primary) hypertension: Secondary | ICD-10-CM

## 2022-09-17 DIAGNOSIS — E669 Obesity, unspecified: Secondary | ICD-10-CM

## 2022-09-17 DIAGNOSIS — I2510 Atherosclerotic heart disease of native coronary artery without angina pectoris: Secondary | ICD-10-CM | POA: Diagnosis not present

## 2022-09-17 DIAGNOSIS — E782 Mixed hyperlipidemia: Secondary | ICD-10-CM | POA: Diagnosis not present

## 2022-09-17 NOTE — Telephone Encounter (Signed)
Patient states he is returning a call received a few minutes ago.

## 2022-09-17 NOTE — Telephone Encounter (Signed)
Left message on voicemail per DPR in reference to upcoming appointment scheduled on 09/21/2022 at 8:00 with detailed instructions given per Myocardial Perfusion Study Information Sheet for the test. LM to arrive 15 minutes early, and that it is imperative to arrive on time for appointment to keep from having the test rescheduled. If you need to cancel or reschedule your appointment, please call the office within 24 hours of your appointment. Failure to do so may result in a cancellation of your appointment, and a $50 no show fee. Phone number given for call back for any questions.

## 2022-09-17 NOTE — Telephone Encounter (Signed)
Spoke with the patient, advised of appt details left on telephone message. I also went over stress test protocol available on his last OV/discharge instructions and he is aware to be fasting, will have labs on the same day of the stress test. Patient verbalized understanding.

## 2022-09-17 NOTE — Patient Instructions (Signed)
Medication Instructions:  Your physician recommends that you continue on your current medications as directed. Please refer to the Current Medication list given to you today.  *If you need a refill on your cardiac medications before your next appointment, please call your pharmacy*   Lab Work: Your physician recommends that you return for lab work in: the morning of your stress test You need to have labs done when you are fasting.  You can come Monday through Friday 8:30 am to 12:00 pm and 1:15 to 4:30. You do not need to make an appointment as the order has already been placed. The labs you are going to have done are BMET, CBC, TSH, LFT and Lipids.  If you have labs (blood work) drawn today and your tests are completely normal, you will receive your results only by: [place] (if you have MyChart) OR A paper copy in the mail If you have any lab test that is abnormal or we need to change your treatment, we will call you to review the results.   Testing/Procedures: You are scheduled for a Myocardial Perfusion Imaging Study.  Please arrive 15 minutes prior to your appointment time for registration and insurance purposes.  The test will take approximately 3 to 4 hours to complete; you may bring reading material.  If someone comes with you to your appointment, they will need to remain in the main lobby due to limited space in the testing area.   How to prepare for your Myocardial Perfusion Test: Do not eat or drink 8 hours prior to your test as your need fasting labs, except you may have water. Do not consume products containing caffeine (regular or decaffeinated) 12 hours prior to your test. (ex: coffee, chocolate, sodas, tea). Do bring a list of your current medications with you.  If not listed below, you may take your medications as normal. Do wear comfortable clothes (no dresses or overalls) and walking shoes, tennis shoes preferred (No heels or open toe shoes are allowed). Do NOT  wear cologne, perfume, aftershave, or lotions (deodorant is allowed). If these instructions are not followed, your test will have to be rescheduled.  If you cannot keep your appointment, please provide 24 hours notification to the Nuclear Lab, to avoid a possible $50 charge to your account.  Follow-Up: At [facility], you and your health needs are our priority.  As part of our continuing mission to provide you with exceptional heart care, we have created designated Provider Care Teams.  These Care Teams include your primary Cardiologist (physician) and Advanced Practice Providers (APPs -  Physician Assistants and Nurse Practitioners) who all work together to provide you with the care you need, when you need it.  We recommend signing up for the patient portal called "MyChart".  Sign up information is provided on this After Visit Summary.  MyChart is used to connect with patients for Virtual Visits (Telemedicine).  Patients are able to view lab/test results, encounter notes, upcoming appointments, etc.  Non-urgent messages can be sent to your provider as well.   To learn more about what you can do with MyChart, go to [email or web address].    Your next appointment:   9 month(s)  Provider:   Jyl Heinz, MD   Other Instructions  Cardiac Nuclear Scan A cardiac nuclear scan is a test that is done to check the flow of blood to your heart. It is done when you are resting and when you are exercising. The test looks for problems  such as: Not enough blood reaching a portion of the heart. The heart muscle not working as it should. You may need this test if you have: Heart disease. Lab results that are not normal. Had heart surgery or a balloon procedure to open up blocked arteries (angioplasty) or a small mesh tube (stent). Chest pain. Shortness of breath. Had a heart attack. In this test, a special dye (tracer) is put into your bloodstream. The tracer will travel to your heart. A  camera will then take pictures of your heart to see how the tracer moves through your heart. This test is usually done at a hospital and takes 2-4 hours. Tell a doctor about: Any allergies you have. All medicines you are taking, including vitamins, herbs, eye drops, creams, and over-the-counter medicines. Any bleeding problems you have. Any surgeries you have had. Any medical conditions you have. Whether you are pregnant or may be pregnant. Any history of asthma or long-term (chronic) lung disease. Any history of heart rhythm disorders or heart valve conditions. What are the risks? Your doctor will talk with you about risks. These may include: Serious chest pain and heart attack. This is only a risk if the stress portion of the test is done. Fast or uneven heartbeats (palpitations). A feeling of warmth in your chest. This feeling usually does not last long. Allergic reaction to the tracer. Shortness of breath or trouble breathing. What happens before the test? Ask your doctor about changing or stopping your normal medicines. Follow instructions from your doctor about what you cannot eat or drink. Remove your jewelry on the day of the test. Ask your doctor if you need to avoid nicotine or caffeine. What happens during the test? An IV tube will be inserted into one of your veins. Your doctor will give you a small amount of tracer through the IV tube. You will wait for 20-40 minutes while the tracer moves through your bloodstream. Your heart will be monitored with an electrocardiogram (ECG). You will lie down on an exam table. Pictures of your heart will be taken for about 15-20 minutes. You may also have a stress test. For this test, one of these things may be done: You will be asked to exercise on a treadmill or a stationary bike. You will be given medicines that will make your heart work harder. This is done if you are unable to exercise. When blood flow to your heart has peaked, a  tracer will again be given through the IV tube. After 20-40 minutes, you will get back on the exam table. More pictures will be taken of your heart. Depending on the tracer that is used, more pictures may need to be taken 3-4 hours later. Your IV tube will be removed when the test is over. The test may vary among doctors and hospitals. What happens after the test? Ask your doctor: Whether you can return to your normal schedule, including diet, activities, travel, and medicines. Whether you should drink more fluids. This will help to remove the tracer from your body. Ask your doctor, or the department that is doing the test: When will my results be ready? How will I get my results? What are my treatment options? What other tests do I need? What are my next steps? This information is not intended to replace advice given to you by your health care provider. Make sure you discuss any questions you have with your health care provider. Document Revised: 11/11/2021 Document Reviewed: 11/11/2021 Elsevier Patient Education  2023 [employer]  

## 2022-09-17 NOTE — Progress Notes (Signed)
Cardiology Office Note:    Date:  09/17/2022   ID:  Steven [name], DOB 1935/02/18, MRN [id number]  PCP:  Kristen Loader, FNP  Cardiologist:  Jenean Lindau, MD   Referring MD: Kristen Loader, FNP    ASSESSMENT:    1. Atherosclerosis of native coronary artery of native heart without angina pectoris   2. Essential hypertension   3. Mixed hyperlipidemia   4. Obesity (BMI 30-39.9)    PLAN:    In order of problems listed above:  Coronary artery disease and dyspnea on exertion: Secondary prevention stressed with the patient.  Importance of compliance with diet medication stressed any vocalized understanding.  In view of these new symptoms we will do a Lexiscan sestamibi to assess for any obstructive evidence of coronary artery disease. Essential hypertension: Blood pressure is stable and diet was emphasized.  He mentions to me that he has an element of whitecoat hypertension.  His blood pressures at home are better. Mixed dyslipidemia: On lipid-lowering medications.  Will do blood work when he comes for a stress test.  Will do fasting lipid lipid check. Obesity: Weight action stressed and diet was emphasized.  He promises to do better. Patient will be seen in follow-up appointment in 6 months or earlier if the patient has any concerns.    Medication Adjustments/Labs and Tests Ordered: Current medicines are reviewed at length with the patient today.  Concerns regarding medicines are outlined above.  No orders of the defined types were placed in this encounter.  No orders of the defined types were placed in this encounter.    No chief complaint on file.    History of Present Illness:    Steven [name] is a 88 y.o. male.  Patient has past medical history of coronary artery disease post stenting.  He has history of essential hypertension, dyslipidemia.  He is previously unknown to me and followed by Dr. Bettina Gavia.  He mentions to me that overall he leads a sedentary lifestyle.  He  has some dyspnea on exertion which is fairly new.  No chest pain orthopnea or PND.  At the time of my evaluation, the patient is alert awake oriented and in no distress.  He is concerned about the dyspnea on exertion.  EKG reveals  Past Medical History:  Diagnosis Date   Acute kidney injury superimposed on CKD ([place]) Q000111Q   Acute metabolic encephalopathy Q000111Q   Aortic ejection murmur 11/18/2021   Atherosclerotic heart disease of native coronary artery without angina pectoris 11/18/2021   Atrial flutter (HCC)    Benign prostatic hyperplasia with lower urinary tract symptoms 11/18/2021   CAD (coronary artery disease)    02/24/18 PCI/DES x1 to the pLAD   CAD (coronary artery disease), native coronary artery 04/11/2018   Cath 02/24/18 normal Left main, 80% stenosis proximal LAD, no significant disease RCA, 10 % stenosis CFX, mild aortic stenosis, [place] 3.5 x 34mm to prox LAD Dr. Irish Lack   Carpal tunnel syndrome    Cataract    left eye   Cervical radiculopathy 11/18/2021   Cervical spondylosis without myelopathy 11/18/2021   Chest pain    Chronic kidney disease, stage 3a ([place]) 11/18/2021   CKD (chronic kidney disease), stage III ([place])    COVID-19 11/18/2021   Deficiency anemia 02/04/2015   Demand ischemia 01/24/2019   Enlarged prostate    Essential hypertension 11/18/2021   Gout 11/18/2021   History of atrial flutter 11/18/2021   History of gout  History of kidney stones    pt has one now but not giving him any problems   Hyperkalemia 06/27/2019   Hyperlipidemia 04/11/2018   Hypertensive heart disease without CHF 04/11/2018   Hyperthermia associated with heat 01/24/2019   Ileus, unspecified ([place]) 06/27/2019   Irregular heart beat 11/18/2021   Jaw pain 11/18/2021   Leukocytopenia 03/11/2015   Mixed hyperlipidemia 11/18/2021   Nonrheumatic aortic valve stenosis    ECHO 01/28/18  Mean gradient 16    Numbness    both hands pt states pinched. 12-17-14 Gabapentin has improved.   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   Unspecified abnormal finding in specimens from other organs, systems and tissues 11/18/2021   UTI (urinary tract infection) 01/24/2019   Vitamin D deficiency 11/18/2021    Past Surgical History:  Procedure Laterality Date   CATARACT EXTRACTION W/PHACO Left 03/15/2013   Procedure: CATARACT EXTRACTION PHACO AND INTRAOCULAR LENS PLACEMENT ([place]);  Surgeon: Adonis Brook, MD;  Location: [facility];  Service: Ophthalmology;  Laterality: Left;   COLONOSCOPY     COLONOSCOPY WITH PROPOFOL N/A 12/24/2014   Procedure: COLONOSCOPY WITH PROPOFOL;  Surgeon: Garlan Fair, MD;  Location: [facility];  Service: Endoscopy;  Laterality: N/A;   CORONARY STENT INTERVENTION N/A 02/24/2018   Procedure: CORONARY STENT INTERVENTION;  Surgeon: Jettie Booze, MD;  Location: [facility];  Service: Cardiovascular;  Laterality: N/A;   ELECTROPHYSIOLOGIC STUDY N/A 02/28/2016   Procedure: A-Flutter Ablation;  Surgeon: Deboraha Sprang, MD;  Location: [facility];  Service: Cardiovascular;  Laterality: N/A;   right cataract removed      RIGHT/LEFT HEART CATH AND CORONARY ANGIOGRAPHY N/A 02/24/2018   Procedure: RIGHT/LEFT HEART CATH AND CORONARY ANGIOGRAPHY;  Surgeon: Jettie Booze, MD;  Location: [facility];  Service: Cardiovascular;  Laterality: N/A;   TONSILLECTOMY     as child    Current Medications: Current Meds  Medication Sig   ascorbic acid (VITAMIN C) 500 MG tablet Take 1 tablet (500 mg total) by mouth daily.   atorvastatin (LIPITOR) 40 MG tablet Take 40 mg by mouth daily.   Cholecalciferol (VITAMIN D3 ADULT GUMMIES) 25 MCG (1000 UT) CHEW Chew 25 mcg by mouth daily.   clopidogrel (PLAVIX) 75 MG tablet Take 75 mg by mouth daily.   Cyanocobalamin (VITAMIN B-12 PO) Take 1  tablet by mouth daily.   indomethacin (INDOCIN) 25 MG capsule Take 25-50 mg by mouth every 6 (six) hours as needed (gout flareup).    metoprolol succinate (TOPROL-XL) 25 MG 24 hr tablet Take 25 mg by mouth daily.   nitroGLYCERIN (NITROSTAT) 0.4 MG SL tablet Place 0.4 mg under the tongue every 5 (five) minutes as needed for chest pain.   Omega-3 Fatty Acids (FISH OIL) 1000 MG CAPS Take 1,000 mg by mouth daily.   RESTASIS 0.05 % ophthalmic emulsion Place 1 drop into both eyes 2 (two) times daily.   tamsulosin (FLOMAX) 0.4 MG CAPS capsule Take 0.4 mg by mouth daily.     Allergies:   Patient has no known allergies.   Social History   Socioeconomic History   Marital status: Widowed    Spouse name: Not on file   Number of children: Not on file   Years of education: Not on file   Highest  education level: Not on file  Occupational History   Not on file  Tobacco Use   Smoking status: Former    Types: Cigars   Smokeless tobacco: Former    Types: Chew   Tobacco comments:    smoked cigars 66yrs ago  Vaping Use   Vaping Use: Never used  Substance and Sexual Activity   Alcohol use: No   Drug use: No   Sexual activity: Never  Other Topics Concern   Not on file  Social History Narrative   Not on file   Social Determinants of Health   Financial Resource Strain: Not on file  Food Insecurity: Not on file  Transportation Needs: Not on file  Physical Activity: Not on file  Stress: Not on file  Social Connections: Not on file     Family History: The patient's family history includes Diabetes in his son; Healthy in his father; Kidney failure in his sister and sister; Other in his mother. There is no history of Heart attack.  ROS:   Please see the history of present illness.    All other systems reviewed and are negative.  EKGs/Labs/Other Studies Reviewed:    The following studies were reviewed today: Sinus rhythm and left ventricular hypertrophy.   Recent Labs: No results  found for requested labs within last 365 days.  Recent Lipid Panel    Component Value Date/Time   CHOL 120 05/07/2021 1449   TRIG 52 05/07/2021 1449   HDL 62 05/07/2021 1449   CHOLHDL 1.9 05/07/2021 1449   CHOLHDL 3.2 01/11/2018 0701   VLDL 17 01/11/2018 0701   LDLCALC 46 05/07/2021 1449    Physical Exam:    VS:  BP (!) 148/62   Pulse 70   Ht 6' (1.829 m)   Wt 221 lb 0.6 oz (100.3 kg)   SpO2 96%   BMI 29.98 kg/m     Wt Readings from Last 3 Encounters:  09/17/22 221 lb 0.6 oz (100.3 kg)  11/25/21 215 lb 0.6 oz (97.5 kg)  05/09/21 214 lb (97.1 kg)     GEN: Patient is in no acute distress HEENT: Normal NECK: No JVD; No carotid bruits LYMPHATICS: No lymphadenopathy CARDIAC: Hear sounds regular, 2/6 systolic murmur at the apex. RESPIRATORY:  Clear to auscultation without rales, wheezing or rhonchi  ABDOMEN: Soft, non-tender, non-distended MUSCULOSKELETAL:  No edema; No deformity  SKIN: Warm and dry NEUROLOGIC:  Alert and oriented x 3 PSYCHIATRIC:  Normal affect   Signed, Jenean Lindau, MD  09/17/2022 11:27 AM    [place]

## 2022-09-21 ENCOUNTER — Ambulatory Visit (HOSPITAL_COMMUNITY): Payer: Medicare HMO | Attending: Cardiology

## 2022-09-21 DIAGNOSIS — I2510 Atherosclerotic heart disease of native coronary artery without angina pectoris: Secondary | ICD-10-CM | POA: Diagnosis not present

## 2022-09-21 LAB — MYOCARDIAL PERFUSION IMAGING
LV dias vol: 107 mL (ref 62–150)
LV sys vol: 58 mL
Nuc Stress EF: 46 %
Peak HR: 83 {beats}/min
Rest HR: 63 {beats}/min
Rest Nuclear Isotope Dose: 10.6 mCi
SDS: 0
SRS: 0
SSS: 0
ST Depression (mm): 0 mm
Stress Nuclear Isotope Dose: 32.8 mCi
TID: 1.11

## 2022-09-21 MED ORDER — TECHNETIUM TC 99M TETROFOSMIN IV KIT
10.6000 | PACK | Freq: Once | INTRAVENOUS | Status: AC | PRN
  Administered 2022-09-21: 10.6 via INTRAVENOUS

## 2022-09-21 MED ORDER — TECHNETIUM TC 99M TETROFOSMIN IV KIT: 32.8000 | PACK | Freq: Once | INTRAVENOUS | Status: AC | PRN

## 2022-09-21 MED ORDER — REGADENOSON 0.4 MG/5ML IV SOLN: 0.4000 mg | Freq: Once | INTRAVENOUS | Status: AC

## 2022-09-28 DIAGNOSIS — I2510 Atherosclerotic heart disease of native coronary artery without angina pectoris: Secondary | ICD-10-CM | POA: Diagnosis not present

## 2022-09-28 DIAGNOSIS — I10 Essential (primary) hypertension: Secondary | ICD-10-CM | POA: Diagnosis not present

## 2022-09-28 DIAGNOSIS — E782 Mixed hyperlipidemia: Secondary | ICD-10-CM | POA: Diagnosis not present

## 2022-09-29 LAB — CBC
Hematocrit: 37.1 % — ABNORMAL LOW (ref 37.5–51.0)
Hemoglobin: 12.2 g/dL — ABNORMAL LOW (ref 13.0–17.7)
MCH: 29.5 pg (ref 26.6–33.0)
MCHC: 32.9 g/dL (ref 31.5–35.7)
MCV: 90 fL (ref 79–97)
Platelets: 152 10*3/uL (ref 150–450)
RBC: 4.13 x10E6/uL — ABNORMAL LOW (ref 4.14–5.80)
RDW: 13.9 % (ref 11.6–15.4)
WBC: 3.6 10*3/uL (ref 3.4–10.8)

## 2022-09-29 LAB — LIPID PANEL
Chol/HDL Ratio: 2.5 ratio (ref 0.0–5.0)
Cholesterol, Total: 131 mg/dL (ref 100–199)
HDL: 53 mg/dL (ref >39)
LDL Chol Calc (NIH): 62 mg/dL (ref 0–99)
Triglycerides: 83 mg/dL (ref 0–149)
VLDL Cholesterol Cal: 16 mg/dL (ref 5–40)

## 2022-09-29 LAB — COMPREHENSIVE METABOLIC PANEL
ALT: 12 IU/L (ref 0–44)
AST: 22 IU/L (ref 0–40)
Albumin/Globulin Ratio: 1.5 (ref 1.2–2.2)
Albumin: 4.2 g/dL (ref 3.7–4.7)
Alkaline Phosphatase: 61 IU/L (ref 44–121)
BUN/Creatinine Ratio: 16 (ref 10–24)
BUN: 19 mg/dL (ref 8–27)
Bilirubin Total: 0.4 mg/dL (ref 0.0–1.2)
CO2: 25 mmol/L (ref 20–29)
Calcium: 9.6 mg/dL (ref 8.6–10.2)
Chloride: 100 mmol/L (ref 96–106)
Creatinine, Ser: 1.22 mg/dL (ref 0.76–1.27)
Globulin, Total: 2.8 g/dL (ref 1.5–4.5)
Glucose: 95 mg/dL (ref 70–99)
Potassium: 4.8 mmol/L (ref 3.5–5.2)
Sodium: 137 mmol/L (ref 134–144)
Total Protein: 7 g/dL (ref 6.0–8.5)
eGFR: 58 mL/min/{1.73_m2} — ABNORMAL LOW (ref >59)

## 2022-09-29 LAB — TSH: TSH: 2.4 u[IU]/mL (ref 0.450–4.500)

## 2022-10-09 ENCOUNTER — Telehealth: Payer: Self-pay | Admitting: Cardiology

## 2022-10-09 MED ORDER — CLOPIDOGREL BISULFATE 75 MG PO TABS: 75.0000 mg | ORAL_TABLET | Freq: Every day | ORAL | 3 refills | Status: DC

## 2022-10-09 NOTE — Telephone Encounter (Signed)
*  STAT* If patient is at the pharmacy, call can be transferred to refill team.   1. Which medications need to be refilled? (please list name of each medication and dose if known)   clopidogrel (PLAVIX) 75 MG tablet    2. Which pharmacy/location (including street and city if local pharmacy) is medication to be sent to?  [facility] - [place], [place] - [address] Phone: [phone]  Fax: [phone]    3. Do they need a 30 day or 90 day supply? 90

## 2022-11-08 ENCOUNTER — Other Ambulatory Visit: Payer: Self-pay | Admitting: Cardiology

## 2022-11-10 DIAGNOSIS — Z1389 Encounter for screening for other disorder: Secondary | ICD-10-CM | POA: Diagnosis not present

## 2022-11-10 DIAGNOSIS — Z0000 Encounter for general adult medical examination without abnormal findings: Secondary | ICD-10-CM | POA: Diagnosis not present

## 2022-11-18 DIAGNOSIS — Z125 Encounter for screening for malignant neoplasm of prostate: Secondary | ICD-10-CM | POA: Diagnosis not present

## 2022-11-18 DIAGNOSIS — D649 Anemia, unspecified: Secondary | ICD-10-CM | POA: Diagnosis not present

## 2022-11-18 DIAGNOSIS — I10 Essential (primary) hypertension: Secondary | ICD-10-CM | POA: Diagnosis not present

## 2022-11-18 DIAGNOSIS — N1831 Chronic kidney disease, stage 3a: Secondary | ICD-10-CM | POA: Diagnosis not present

## 2022-11-18 DIAGNOSIS — I25118 Atherosclerotic heart disease of native coronary artery with other forms of angina pectoris: Secondary | ICD-10-CM | POA: Diagnosis not present

## 2022-11-18 DIAGNOSIS — D709 Neutropenia, unspecified: Secondary | ICD-10-CM | POA: Diagnosis not present

## 2022-11-18 DIAGNOSIS — M109 Gout, unspecified: Secondary | ICD-10-CM | POA: Diagnosis not present

## 2022-11-18 DIAGNOSIS — Z0000 Encounter for general adult medical examination without abnormal findings: Secondary | ICD-10-CM | POA: Diagnosis not present

## 2022-11-18 DIAGNOSIS — E782 Mixed hyperlipidemia: Secondary | ICD-10-CM | POA: Diagnosis not present

## 2022-11-18 DIAGNOSIS — R0602 Shortness of breath: Secondary | ICD-10-CM | POA: Diagnosis not present

## 2022-11-18 DIAGNOSIS — N401 Enlarged prostate with lower urinary tract symptoms: Secondary | ICD-10-CM | POA: Diagnosis not present

## 2022-11-18 DIAGNOSIS — E559 Vitamin D deficiency, unspecified: Secondary | ICD-10-CM | POA: Diagnosis not present

## 2022-11-18 DIAGNOSIS — I4892 Unspecified atrial flutter: Secondary | ICD-10-CM | POA: Diagnosis not present

## 2023-01-19 DIAGNOSIS — H43813 Vitreous degeneration, bilateral: Secondary | ICD-10-CM | POA: Diagnosis not present

## 2023-01-19 DIAGNOSIS — H04123 Dry eye syndrome of bilateral lacrimal glands: Secondary | ICD-10-CM | POA: Diagnosis not present

## 2023-01-19 DIAGNOSIS — H35033 Hypertensive retinopathy, bilateral: Secondary | ICD-10-CM | POA: Diagnosis not present

## 2023-01-19 DIAGNOSIS — H40013 Open angle with borderline findings, low risk, bilateral: Secondary | ICD-10-CM | POA: Diagnosis not present

## 2023-01-20 DIAGNOSIS — Z6831 Body mass index (BMI) 31.0-31.9, adult: Secondary | ICD-10-CM | POA: Diagnosis not present

## 2023-01-20 DIAGNOSIS — L237 Allergic contact dermatitis due to plants, except food: Secondary | ICD-10-CM | POA: Diagnosis not present

## 2023-02-17 ENCOUNTER — Other Ambulatory Visit: Payer: Self-pay | Admitting: Cardiology

## 2023-03-09 DIAGNOSIS — D709 Neutropenia, unspecified: Secondary | ICD-10-CM | POA: Diagnosis not present

## 2023-03-09 DIAGNOSIS — R059 Cough, unspecified: Secondary | ICD-10-CM | POA: Diagnosis not present

## 2023-03-09 DIAGNOSIS — Z20822 Contact with and (suspected) exposure to covid-19: Secondary | ICD-10-CM | POA: Diagnosis not present

## 2023-03-09 DIAGNOSIS — R0602 Shortness of breath: Secondary | ICD-10-CM | POA: Diagnosis not present

## 2023-03-09 DIAGNOSIS — E782 Mixed hyperlipidemia: Secondary | ICD-10-CM | POA: Diagnosis not present

## 2023-03-09 DIAGNOSIS — R918 Other nonspecific abnormal finding of lung field: Secondary | ICD-10-CM | POA: Diagnosis not present

## 2023-03-09 DIAGNOSIS — D649 Anemia, unspecified: Secondary | ICD-10-CM | POA: Diagnosis not present

## 2023-03-09 DIAGNOSIS — N1831 Chronic kidney disease, stage 3a: Secondary | ICD-10-CM | POA: Diagnosis not present

## 2023-03-09 DIAGNOSIS — R9389 Abnormal findings on diagnostic imaging of other specified body structures: Secondary | ICD-10-CM | POA: Diagnosis not present

## 2023-03-09 DIAGNOSIS — R197 Diarrhea, unspecified: Secondary | ICD-10-CM | POA: Diagnosis not present

## 2023-03-09 DIAGNOSIS — I25118 Atherosclerotic heart disease of native coronary artery with other forms of angina pectoris: Secondary | ICD-10-CM | POA: Diagnosis not present

## 2023-03-09 DIAGNOSIS — I10 Essential (primary) hypertension: Secondary | ICD-10-CM | POA: Diagnosis not present

## 2023-03-25 DIAGNOSIS — J029 Acute pharyngitis, unspecified: Secondary | ICD-10-CM | POA: Diagnosis not present

## 2023-03-25 DIAGNOSIS — M109 Gout, unspecified: Secondary | ICD-10-CM | POA: Diagnosis not present

## 2023-03-25 DIAGNOSIS — D649 Anemia, unspecified: Secondary | ICD-10-CM | POA: Diagnosis not present

## 2023-03-25 DIAGNOSIS — M25562 Pain in left knee: Secondary | ICD-10-CM | POA: Diagnosis not present

## 2023-03-25 DIAGNOSIS — I10 Essential (primary) hypertension: Secondary | ICD-10-CM | POA: Diagnosis not present

## 2023-03-25 DIAGNOSIS — N1831 Chronic kidney disease, stage 3a: Secondary | ICD-10-CM | POA: Diagnosis not present

## 2023-05-24 DIAGNOSIS — N400 Enlarged prostate without lower urinary tract symptoms: Secondary | ICD-10-CM | POA: Diagnosis not present

## 2023-05-24 DIAGNOSIS — N5201 Erectile dysfunction due to arterial insufficiency: Secondary | ICD-10-CM | POA: Diagnosis not present

## 2023-06-16 ENCOUNTER — Other Ambulatory Visit: Payer: Self-pay | Admitting: Cardiology

## 2023-07-14 ENCOUNTER — Other Ambulatory Visit: Payer: Self-pay | Admitting: Cardiology

## 2023-07-20 DIAGNOSIS — H43813 Vitreous degeneration, bilateral: Secondary | ICD-10-CM | POA: Diagnosis not present

## 2023-07-20 DIAGNOSIS — H40013 Open angle with borderline findings, low risk, bilateral: Secondary | ICD-10-CM | POA: Diagnosis not present

## 2023-07-20 DIAGNOSIS — H35033 Hypertensive retinopathy, bilateral: Secondary | ICD-10-CM | POA: Diagnosis not present

## 2023-07-20 DIAGNOSIS — H04123 Dry eye syndrome of bilateral lacrimal glands: Secondary | ICD-10-CM | POA: Diagnosis not present

## 2023-08-28 ENCOUNTER — Other Ambulatory Visit: Payer: Self-pay | Admitting: Cardiology

## 2023-09-04 ENCOUNTER — Other Ambulatory Visit: Payer: Self-pay | Admitting: Cardiology

## 2023-09-06 NOTE — Telephone Encounter (Signed)
 Rx refill sent to pharmacy.

## 2023-09-20 ENCOUNTER — Other Ambulatory Visit: Payer: Self-pay | Admitting: Cardiology

## 2023-09-27 ENCOUNTER — Other Ambulatory Visit: Payer: Self-pay | Admitting: Cardiology

## 2023-09-27 NOTE — Telephone Encounter (Signed)
 Prescription sent to pharmacy.

## 2023-09-29 ENCOUNTER — Other Ambulatory Visit: Payer: Self-pay | Admitting: Cardiology

## 2023-09-29 MED ORDER — METOPROLOL SUCCINATE ER 25 MG PO TB24: 25.0000 mg | ORAL_TABLET | Freq: Every morning | ORAL | 0 refills | Status: DC

## 2023-10-13 ENCOUNTER — Other Ambulatory Visit: Payer: Self-pay | Admitting: Cardiology

## 2023-11-01 ENCOUNTER — Other Ambulatory Visit: Payer: Self-pay | Admitting: Cardiology

## 2023-11-15 ENCOUNTER — Other Ambulatory Visit: Payer: Self-pay | Admitting: Cardiology

## 2023-11-19 DIAGNOSIS — M109 Gout, unspecified: Secondary | ICD-10-CM | POA: Diagnosis not present

## 2023-11-19 DIAGNOSIS — R0602 Shortness of breath: Secondary | ICD-10-CM | POA: Diagnosis not present

## 2023-11-19 DIAGNOSIS — D709 Neutropenia, unspecified: Secondary | ICD-10-CM | POA: Diagnosis not present

## 2023-11-19 DIAGNOSIS — N1831 Chronic kidney disease, stage 3a: Secondary | ICD-10-CM | POA: Diagnosis not present

## 2023-11-19 DIAGNOSIS — I25118 Atherosclerotic heart disease of native coronary artery with other forms of angina pectoris: Secondary | ICD-10-CM | POA: Diagnosis not present

## 2023-11-19 DIAGNOSIS — I4892 Unspecified atrial flutter: Secondary | ICD-10-CM | POA: Diagnosis not present

## 2023-11-19 DIAGNOSIS — N529 Male erectile dysfunction, unspecified: Secondary | ICD-10-CM | POA: Diagnosis not present

## 2023-11-19 DIAGNOSIS — Z6829 Body mass index (BMI) 29.0-29.9, adult: Secondary | ICD-10-CM | POA: Diagnosis not present

## 2023-11-19 DIAGNOSIS — E782 Mixed hyperlipidemia: Secondary | ICD-10-CM | POA: Diagnosis not present

## 2023-11-19 DIAGNOSIS — E559 Vitamin D deficiency, unspecified: Secondary | ICD-10-CM | POA: Diagnosis not present

## 2023-11-19 DIAGNOSIS — N400 Enlarged prostate without lower urinary tract symptoms: Secondary | ICD-10-CM | POA: Diagnosis not present

## 2023-11-19 DIAGNOSIS — Z0000 Encounter for general adult medical examination without abnormal findings: Secondary | ICD-10-CM | POA: Diagnosis not present

## 2023-11-19 DIAGNOSIS — I10 Essential (primary) hypertension: Secondary | ICD-10-CM | POA: Diagnosis not present

## 2023-11-27 DIAGNOSIS — E782 Mixed hyperlipidemia: Secondary | ICD-10-CM | POA: Diagnosis not present

## 2023-11-27 DIAGNOSIS — N401 Enlarged prostate with lower urinary tract symptoms: Secondary | ICD-10-CM | POA: Diagnosis not present

## 2023-11-27 DIAGNOSIS — N1831 Chronic kidney disease, stage 3a: Secondary | ICD-10-CM | POA: Diagnosis not present

## 2023-11-27 DIAGNOSIS — I25118 Atherosclerotic heart disease of native coronary artery with other forms of angina pectoris: Secondary | ICD-10-CM | POA: Diagnosis not present

## 2023-12-01 ENCOUNTER — Other Ambulatory Visit: Payer: Self-pay | Admitting: Cardiology

## 2023-12-27 ENCOUNTER — Other Ambulatory Visit: Payer: Self-pay | Admitting: Cardiology

## 2023-12-27 DIAGNOSIS — I25118 Atherosclerotic heart disease of native coronary artery with other forms of angina pectoris: Secondary | ICD-10-CM | POA: Diagnosis not present

## 2023-12-27 DIAGNOSIS — N401 Enlarged prostate with lower urinary tract symptoms: Secondary | ICD-10-CM | POA: Diagnosis not present

## 2023-12-27 DIAGNOSIS — E782 Mixed hyperlipidemia: Secondary | ICD-10-CM | POA: Diagnosis not present

## 2023-12-27 DIAGNOSIS — N1831 Chronic kidney disease, stage 3a: Secondary | ICD-10-CM | POA: Diagnosis not present

## 2023-12-27 NOTE — Telephone Encounter (Signed)
 Rx refill sent to pharmacy.

## 2024-01-19 ENCOUNTER — Other Ambulatory Visit: Payer: Self-pay | Admitting: Cardiology

## 2024-01-20 DIAGNOSIS — H35033 Hypertensive retinopathy, bilateral: Secondary | ICD-10-CM | POA: Diagnosis not present

## 2024-01-20 DIAGNOSIS — H04123 Dry eye syndrome of bilateral lacrimal glands: Secondary | ICD-10-CM | POA: Diagnosis not present

## 2024-01-20 DIAGNOSIS — H43813 Vitreous degeneration, bilateral: Secondary | ICD-10-CM | POA: Diagnosis not present

## 2024-01-20 DIAGNOSIS — H40013 Open angle with borderline findings, low risk, bilateral: Secondary | ICD-10-CM | POA: Diagnosis not present

## 2024-01-20 MED ORDER — ATORVASTATIN CALCIUM 40 MG PO TABS: 40.0000 mg | ORAL_TABLET | Freq: Every day | ORAL | 0 refills | Status: AC

## 2024-01-20 MED ORDER — CLOPIDOGREL BISULFATE 75 MG PO TABS: 75.0000 mg | ORAL_TABLET | Freq: Every day | ORAL | 0 refills | Status: DC

## 2024-01-27 DIAGNOSIS — N1831 Chronic kidney disease, stage 3a: Secondary | ICD-10-CM | POA: Diagnosis not present

## 2024-01-27 DIAGNOSIS — I25118 Atherosclerotic heart disease of native coronary artery with other forms of angina pectoris: Secondary | ICD-10-CM | POA: Diagnosis not present

## 2024-01-27 DIAGNOSIS — E782 Mixed hyperlipidemia: Secondary | ICD-10-CM | POA: Diagnosis not present

## 2024-01-27 DIAGNOSIS — N401 Enlarged prostate with lower urinary tract symptoms: Secondary | ICD-10-CM | POA: Diagnosis not present

## 2024-02-27 DIAGNOSIS — E782 Mixed hyperlipidemia: Secondary | ICD-10-CM | POA: Diagnosis not present

## 2024-02-27 DIAGNOSIS — I25118 Atherosclerotic heart disease of native coronary artery with other forms of angina pectoris: Secondary | ICD-10-CM | POA: Diagnosis not present

## 2024-02-27 DIAGNOSIS — N1831 Chronic kidney disease, stage 3a: Secondary | ICD-10-CM | POA: Diagnosis not present

## 2024-02-27 DIAGNOSIS — N401 Enlarged prostate with lower urinary tract symptoms: Secondary | ICD-10-CM | POA: Diagnosis not present

## 2024-03-06 ENCOUNTER — Other Ambulatory Visit: Payer: Self-pay | Admitting: Cardiology

## 2024-03-14 ENCOUNTER — Telehealth: Payer: Self-pay | Admitting: Cardiology

## 2024-03-14 MED ORDER — CLOPIDOGREL BISULFATE 75 MG PO TABS: 75.0000 mg | ORAL_TABLET | Freq: Every day | ORAL | 1 refills | Status: DC

## 2024-03-14 NOTE — Telephone Encounter (Signed)
 Pt's medication was sent to pt's pharmacy as requested. Confirmation received.

## 2024-03-14 NOTE — Telephone Encounter (Signed)
*  STAT* If patient is at the pharmacy, call can be transferred to refill team.   1. Which medications need to be refilled? (please list name of each medication and dose if known) clopidogrel  (PLAVIX ) 75 MG tablet   2. Which pharmacy/location (including street and city if local pharmacy) is medication to be sent to?  [facility] - [place], [place] - [address]      3. Do they need a 30 day or 90 day supply? 90 day    Pt is out of medication and has office visit scheduled

## 2024-03-28 DIAGNOSIS — I25118 Atherosclerotic heart disease of native coronary artery with other forms of angina pectoris: Secondary | ICD-10-CM | POA: Diagnosis not present

## 2024-03-28 DIAGNOSIS — N401 Enlarged prostate with lower urinary tract symptoms: Secondary | ICD-10-CM | POA: Diagnosis not present

## 2024-03-28 DIAGNOSIS — E782 Mixed hyperlipidemia: Secondary | ICD-10-CM | POA: Diagnosis not present

## 2024-03-28 DIAGNOSIS — N1831 Chronic kidney disease, stage 3a: Secondary | ICD-10-CM | POA: Diagnosis not present

## 2024-04-10 DIAGNOSIS — Z833 Family history of diabetes mellitus: Secondary | ICD-10-CM | POA: Diagnosis not present

## 2024-04-10 DIAGNOSIS — Z7902 Long term (current) use of antithrombotics/antiplatelets: Secondary | ICD-10-CM | POA: Diagnosis not present

## 2024-04-10 DIAGNOSIS — Z8249 Family history of ischemic heart disease and other diseases of the circulatory system: Secondary | ICD-10-CM | POA: Diagnosis not present

## 2024-04-10 DIAGNOSIS — M1990 Unspecified osteoarthritis, unspecified site: Secondary | ICD-10-CM | POA: Diagnosis not present

## 2024-04-10 DIAGNOSIS — I10 Essential (primary) hypertension: Secondary | ICD-10-CM | POA: Diagnosis not present

## 2024-04-10 DIAGNOSIS — E785 Hyperlipidemia, unspecified: Secondary | ICD-10-CM | POA: Diagnosis not present

## 2024-04-10 DIAGNOSIS — I2510 Atherosclerotic heart disease of native coronary artery without angina pectoris: Secondary | ICD-10-CM | POA: Diagnosis not present

## 2024-04-10 DIAGNOSIS — I359 Nonrheumatic aortic valve disorder, unspecified: Secondary | ICD-10-CM | POA: Diagnosis not present

## 2024-04-10 DIAGNOSIS — R011 Cardiac murmur, unspecified: Secondary | ICD-10-CM | POA: Diagnosis not present

## 2024-04-28 DIAGNOSIS — I25118 Atherosclerotic heart disease of native coronary artery with other forms of angina pectoris: Secondary | ICD-10-CM | POA: Diagnosis not present

## 2024-04-28 DIAGNOSIS — E782 Mixed hyperlipidemia: Secondary | ICD-10-CM | POA: Diagnosis not present

## 2024-04-28 DIAGNOSIS — N1831 Chronic kidney disease, stage 3a: Secondary | ICD-10-CM | POA: Diagnosis not present

## 2024-04-28 DIAGNOSIS — N401 Enlarged prostate with lower urinary tract symptoms: Secondary | ICD-10-CM | POA: Diagnosis not present

## 2024-05-08 DIAGNOSIS — N1831 Chronic kidney disease, stage 3a: Secondary | ICD-10-CM | POA: Diagnosis not present

## 2024-05-09 ENCOUNTER — Other Ambulatory Visit: Payer: Self-pay | Admitting: Cardiology

## 2024-05-10 ENCOUNTER — Ambulatory Visit: Admitting: Cardiology

## 2024-05-22 DIAGNOSIS — N401 Enlarged prostate with lower urinary tract symptoms: Secondary | ICD-10-CM | POA: Diagnosis not present

## 2024-05-22 DIAGNOSIS — N528 Other male erectile dysfunction: Secondary | ICD-10-CM | POA: Diagnosis not present

## 2024-05-22 DIAGNOSIS — R3912 Poor urinary stream: Secondary | ICD-10-CM | POA: Diagnosis not present

## 2024-05-24 ENCOUNTER — Encounter: Payer: Self-pay | Admitting: Cardiology

## 2024-05-24 ENCOUNTER — Ambulatory Visit: Attending: Cardiology | Admitting: Cardiology

## 2024-05-24 VITALS — BP 122/58 | HR 85 | Ht 72.0 in | Wt 205.1 lb

## 2024-05-24 DIAGNOSIS — I10 Essential (primary) hypertension: Secondary | ICD-10-CM | POA: Diagnosis not present

## 2024-05-24 DIAGNOSIS — R011 Cardiac murmur, unspecified: Secondary | ICD-10-CM

## 2024-05-24 DIAGNOSIS — I25118 Atherosclerotic heart disease of native coronary artery with other forms of angina pectoris: Secondary | ICD-10-CM | POA: Diagnosis not present

## 2024-05-24 DIAGNOSIS — I2510 Atherosclerotic heart disease of native coronary artery without angina pectoris: Secondary | ICD-10-CM

## 2024-05-24 DIAGNOSIS — E782 Mixed hyperlipidemia: Secondary | ICD-10-CM

## 2024-05-24 NOTE — Patient Instructions (Signed)
 Medication Instructions:  Your physician recommends that you continue on your current medications as directed. Please refer to the Current Medication list given to you today.  *If you need a refill on your cardiac medications before your next appointment, please call your pharmacy*   Lab Work: None ordered If you have labs (blood work) drawn today and your tests are completely normal, you will receive your results only by: MyChart Message (if you have MyChart) OR A paper copy in the mail If you have any lab test that is abnormal or we need to change your treatment, we will call you to review the results.  Testing/Procedures: Your physician has requested that you have an echocardiogram. Echocardiography is a painless test that uses sound waves to create images of your heart. It provides your doctor with information about the size and shape of your heart and how well your heart's chambers and valves are working. This procedure takes approximately one hour. There are no restrictions for this procedure. Please do NOT wear cologne, perfume, aftershave, or lotions (deodorant is allowed). Please arrive 15 minutes prior to your appointment time.  Please note: We ask at that you not bring children with you during ultrasound (echo/ vascular) testing. Due to room size and safety concerns, children are not allowed in the ultrasound rooms during exams. Our front office staff cannot provide observation of children in our lobby area while testing is being conducted. An adult accompanying a patient to their appointment will only be allowed in the ultrasound room at the discretion of the ultrasound technician under special circumstances. We apologize for any inconvenience.  Follow-Up: At [facility], you and your health needs are our priority.  As part of our continuing mission to provide you with exceptional heart care, we have created designated Provider Care Teams.  These Care Teams include your primary  Cardiologist (physician) and Advanced Practice Providers (APPs -  Physician Assistants and Nurse Practitioners) who all work together to provide you with the care you need, when you need it.  We recommend signing up for the patient portal called MyChart.  Sign up information is provided on this After Visit Summary.  MyChart is used to connect with patients for Virtual Visits (Telemedicine).  Patients are able to view lab/test results, encounter notes, upcoming appointments, etc.  Non-urgent messages can be sent to your provider as well.   To learn more about what you can do with MyChart, go to [email or web address].    Your next appointment:   9 month(s)  The format for your next appointment:   In Person  Provider:   Jennifer Crape, MD   Other Instructions Echocardiogram An echocardiogram is a test that uses sound waves (ultrasound) to produce images of the heart. Images from an echocardiogram can provide important information about: Heart size and shape. The size and thickness and movement of your heart's walls. Heart muscle function and strength. Heart valve function or if you have stenosis. Stenosis is when the heart valves are too narrow. If blood is flowing backward through the heart valves (regurgitation). A tumor or infectious growth around the heart valves. Areas of heart muscle that are not working well because of poor blood flow or injury from a heart attack. Aneurysm detection. An aneurysm is a weak or damaged part of an artery wall. The wall bulges out from the normal force of blood pumping through the body. Tell a health care provider about: Any allergies you have. All medicines you are taking, including vitamins, herbs,  eye drops, creams, and over-the-counter medicines. Any blood disorders you have. Any surgeries you have had. Any medical conditions you have. Whether you are pregnant or may be pregnant. What are the risks? Generally, this is a safe test. However,  problems may occur, including an allergic reaction to dye (contrast) that may be used during the test. What happens before the test? No specific preparation is needed. You may eat and drink normally. What happens during the test? You will take off your clothes from the waist up and put on a hospital gown. Electrodes or electrocardiogram (ECG)patches may be placed on your chest. The electrodes or patches are then connected to a device that monitors your heart rate and rhythm. You will lie down on a table for an ultrasound exam. A gel will be applied to your chest to help sound waves pass through your skin. A handheld device, called a transducer, will be pressed against your chest and moved over your heart. The transducer produces sound waves that travel to your heart and bounce back (or echo back) to the transducer. These sound waves will be captured in real-time and changed into images of your heart that can be viewed on a video monitor. The images will be recorded on a computer and reviewed by your health care provider. You may be asked to change positions or hold your breath for a short time. This makes it easier to get different views or better views of your heart. In some cases, you may receive contrast through an IV in one of your veins. This can improve the quality of the pictures from your heart. The procedure may vary among health care providers and hospitals.   What can I expect after the test? You may return to your normal, everyday life, including diet, activities, and medicines, unless your health care provider tells you not to do that. Follow these instructions at home: It is up to you to get the results of your test. Ask your health care provider, or the department that is doing the test, when your results will be ready. Keep all follow-up visits. This is important. Summary An echocardiogram is a test that uses sound waves (ultrasound) to produce images of the heart. Images from an  echocardiogram can provide important information about the size and shape of your heart, heart muscle function, heart valve function, and other possible heart problems. You do not need to do anything to prepare before this test. You may eat and drink normally. After the echocardiogram is completed, you may return to your normal, everyday life, unless your health care provider tells you not to do that. This information is not intended to replace advice given to you by your health care provider. Make sure you discuss any questions you have with your health care provider. Document Revised: 02/06/2020 Document Reviewed: 02/06/2020 Elsevier Patient Education  2021 [employer]   Important Information About Sugar

## 2024-05-24 NOTE — Progress Notes (Signed)
 Cardiology Office Note:    Date:  05/24/2024   ID:  Steven [name], DOB Aug 03, 1934, MRN [id number]  PCP:  Marvene Prentice SAUNDERS, FNP  Cardiologist:  Jennifer SAUNDERS Crape, MD   Referring MD: Marvene Prentice SAUNDERS, FNP    ASSESSMENT:    1. Mixed hyperlipidemia   2. Atherosclerosis of native coronary artery of native heart without angina pectoris   3. Essential hypertension   4. Coronary artery disease involving native coronary artery of native heart without angina pectoris    PLAN:    In order of problems listed above:  Coronary artery disease: Secondary prevention stressed with the patient.  Importance of compliance with diet medication stressed and patient verbalized standing.  He was advised to walk on a regular basis.  I advised him against sedentary lifestyle. Essential hypertension: Blood pressure is stable and diet was emphasized.  Lifestyle modification urged. Mixed dyslipidemia: On lipid-lowering medications followed by primary care.  Goal LDL less than 60. Patient will be seen in follow-up appointment in 9 months or earlier if the patient has any concerns.    Medication Adjustments/Labs and Tests Ordered: Current medicines are reviewed at length with the patient today.  Concerns regarding medicines are outlined above.  Orders Placed This Encounter  Procedures   EKG 12-Lead   No orders of the defined types were placed in this encounter.    No chief complaint on file.    History of Present Illness:    Steven [name] is a 89 y.o. male.  Patient has past medical history of coronary atherosclerosis, essential hypertension, mixed dyslipidemia.  He ambulates age appropriately.  He denies any chest pain orthopnea or PND.  At the time of my evaluation, the patient is alert awake oriented and in no distress.  Past Medical History:  Diagnosis Date   Acute kidney injury superimposed on CKD 06/27/2019   Acute metabolic encephalopathy 01/24/2019   Aortic ejection murmur 11/18/2021    Atherosclerotic heart disease of native coronary artery without angina pectoris 11/18/2021   Atrial flutter (HCC)    Benign prostatic hyperplasia with lower urinary tract symptoms 11/18/2021   CAD (coronary artery disease)    02/24/18 PCI/DES x1 to the pLAD   CAD (coronary artery disease), native coronary artery 04/11/2018   Cath 02/24/18 normal Left main, 80% stenosis proximal LAD, no significant disease RCA, 10 % stenosis CFX, mild aortic stenosis, [place] 3.5 x 15mm to prox LAD Dr. Dann   Carpal tunnel syndrome    Cataract    left eye   Cervical radiculopathy 11/18/2021   Cervical spondylosis without myelopathy 11/18/2021   Chest pain    Chronic kidney disease, stage 3a (HCC) 11/18/2021   CKD (chronic kidney disease), stage III (HCC)    COVID-19 11/18/2021   Deficiency anemia 02/04/2015   Demand ischemia (HCC) 01/24/2019   Enlarged prostate    Essential hypertension 11/18/2021   Gout 11/18/2021   History of atrial flutter 11/18/2021   History of gout    History of kidney stones    pt has one now but not giving him any problems   Hyperkalemia 06/27/2019   Hyperlipidemia 04/11/2018   Hypertensive heart disease without CHF 04/11/2018   Hyperthermia associated with heat 01/24/2019   Ileus, unspecified (HCC) 06/27/2019   Irregular heart beat 11/18/2021   Jaw pain 11/18/2021   Leukocytopenia 03/11/2015   Mixed hyperlipidemia 11/18/2021   Nonrheumatic aortic valve stenosis    ECHO 01/28/18  Mean gradient 16    Numbness  both hands pt states pinched. 12-17-14 Gabapentin  has improved.   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   Unspecified abnormal finding in specimens from other organs, systems and tissues 11/18/2021   UTI (urinary tract infection) 01/24/2019   Vitamin D deficiency 11/18/2021    Past Surgical  History:  Procedure Laterality Date   CATARACT EXTRACTION W/PHACO Left 03/15/2013   Procedure: CATARACT EXTRACTION PHACO AND INTRAOCULAR LENS PLACEMENT (IOC);  Surgeon: Jestine Bunnell, MD;  Location: [facility] OR;  Service: Ophthalmology;  Laterality: Left;   COLONOSCOPY     COLONOSCOPY WITH PROPOFOL  N/A 12/24/2014   Procedure: COLONOSCOPY WITH PROPOFOL ;  Surgeon: Gladis MARLA Louder, MD;  Location: [facility];  Service: Endoscopy;  Laterality: N/A;   CORONARY STENT INTERVENTION N/A 02/24/2018   Procedure: CORONARY STENT INTERVENTION;  Surgeon: Dann Candyce RAMAN, MD;  Location: [facility] INVASIVE CV LAB;  Service: Cardiovascular;  Laterality: N/A;   ELECTROPHYSIOLOGIC STUDY N/A 02/28/2016   Procedure: A-Flutter Ablation;  Surgeon: Elspeth JAYSON Sage, MD;  Location: [facility] INVASIVE CV LAB;  Service: Cardiovascular;  Laterality: N/A;   right cataract removed      RIGHT/LEFT HEART CATH AND CORONARY ANGIOGRAPHY N/A 02/24/2018   Procedure: RIGHT/LEFT HEART CATH AND CORONARY ANGIOGRAPHY;  Surgeon: Dann Candyce RAMAN, MD;  Location: [facility] INVASIVE CV LAB;  Service: Cardiovascular;  Laterality: N/A;   TONSILLECTOMY     as child    Current Medications: Current Meds  Medication Sig   ascorbic acid  (VITAMIN C) 500 MG tablet Take 1 tablet (500 mg total) by mouth daily.   atorvastatin  (LIPITOR) 40 MG tablet Take 1 tablet (40 mg total) by mouth daily.   Cholecalciferol (VITAMIN D3 ADULT GUMMIES) 25 MCG (1000 UT) CHEW Chew 25 mcg by mouth daily.   clopidogrel  (PLAVIX ) 75 MG tablet Take 1 tablet (75 mg total) by mouth daily. Patient must keep appointment on 05/24/24 for further refills. 3 rd/final attempt   Cyanocobalamin (VITAMIN B-12 PO) Take 1 tablet by mouth daily.   metoprolol  succinate (TOPROL -XL) 25 MG 24 hr tablet Take 1 tablet (25 mg total) by mouth daily.   nitroGLYCERIN  (NITROSTAT ) 0.4 MG SL tablet Place 0.4 mg under the tongue every 5 (five) minutes as needed for chest pain.   Omega-3 Fatty Acids (FISH OIL) 1000 MG CAPS  Take 1,000 mg by mouth daily.   RESTASIS 0.05 % ophthalmic emulsion Place 1 drop into both eyes 2 (two) times daily.   tamsulosin  (FLOMAX ) 0.4 MG CAPS capsule Take 0.4 mg by mouth daily.     Allergies:   Patient has no known allergies.   Social History   Socioeconomic History   Marital status: Widowed    Spouse name: Not on file   Number of children: Not on file   Years of education: Not on file   Highest education level: Not on file  Occupational History   Not on file  Tobacco Use   Smoking status: Former    Types: Cigars   Smokeless tobacco: Former    Types: Chew   Tobacco comments:    smoked cigars 57yrs ago  Vaping Use   Vaping status: Never Used  Substance and Sexual Activity   Alcohol use: No   Drug use: No   Sexual activity: Never  Other Topics Concern   Not on file  Social History  Narrative   Not on file   Social Drivers of Health   Financial Resource Strain: Not on file  Food Insecurity: Not on file  Transportation Needs: Not on file  Physical Activity: Not on file  Stress: Not on file  Social Connections: Not on file     Family History: The patient's family history includes Diabetes in his son; Healthy in his father; Kidney failure in his sister and sister; Other in his mother. There is no history of Heart attack.  ROS:   Please see the history of present illness.    All other systems reviewed and are negative.  EKGs/Labs/Other Studies Reviewed:    The following studies were reviewed today: .SABRAEKG Interpretation Date/Time:  Wednesday May 24 2024 09:09:48 EST Ventricular Rate:  85 PR Interval:  168 QRS Duration:  86 QT Interval:  358 QTC Calculation: 426 R Axis:   -17  Text Interpretation: Normal sinus rhythm Moderate voltage criteria for LVH, may be normal variant ( R in aVL , Cornell product ) When compared with ECG of 21-Sep-2022 09:53, Premature atrial complexes are no longer Present Confirmed by Edwyna Backers [phone]) on 05/24/2024  9:54:26 AM     Recent Labs: No results found for requested labs within last 365 days.  Recent Lipid Panel    Component Value Date/Time   CHOL 131 09/28/2022 1113   TRIG 83 09/28/2022 1113   HDL 53 09/28/2022 1113   CHOLHDL 2.5 09/28/2022 1113   CHOLHDL 3.2 01/11/2018 0701   VLDL 17 01/11/2018 0701   LDLCALC 62 09/28/2022 1113    Physical Exam:    VS:  BP (!) 140/60   Pulse 85   Ht 6' (1.829 m)   Wt 205 lb 1.9 oz (93 kg)   SpO2 96%   BMI 27.82 kg/m     Wt Readings from Last 3 Encounters:  05/24/24 205 lb 1.9 oz (93 kg)  09/17/22 221 lb 0.6 oz (100.3 kg)  11/25/21 215 lb 0.6 oz (97.5 kg)     GEN: Patient is in no acute distress HEENT: Normal NECK: No JVD; No carotid bruits LYMPHATICS: No lymphadenopathy CARDIAC: Hear sounds regular, 2/6 systolic murmur at the apex. RESPIRATORY:  Clear to auscultation without rales, wheezing or rhonchi  ABDOMEN: Soft, non-tender, non-distended MUSCULOSKELETAL:  No edema; No deformity  SKIN: Warm and dry NEUROLOGIC:  Alert and oriented x 3 PSYCHIATRIC:  Normal affect   Signed, Backers JONELLE Edwyna, MD  05/24/2024 9:55 AM    [facility]

## 2024-05-28 DIAGNOSIS — N401 Enlarged prostate with lower urinary tract symptoms: Secondary | ICD-10-CM | POA: Diagnosis not present

## 2024-05-28 DIAGNOSIS — E782 Mixed hyperlipidemia: Secondary | ICD-10-CM | POA: Diagnosis not present

## 2024-05-28 DIAGNOSIS — N1831 Chronic kidney disease, stage 3a: Secondary | ICD-10-CM | POA: Diagnosis not present

## 2024-05-28 DIAGNOSIS — I25118 Atherosclerotic heart disease of native coronary artery with other forms of angina pectoris: Secondary | ICD-10-CM | POA: Diagnosis not present

## 2024-06-07 ENCOUNTER — Other Ambulatory Visit: Payer: Self-pay | Admitting: Cardiology

## 2024-06-19 ENCOUNTER — Ambulatory Visit (HOSPITAL_BASED_OUTPATIENT_CLINIC_OR_DEPARTMENT_OTHER)
Admission: RE | Admit: 2024-06-19 | Discharge: 2024-06-19 | Disposition: A | Discharge status: Home / Self Care | Source: Ambulatory Visit | Attending: Cardiology | Admitting: Cardiology

## 2024-06-19 DIAGNOSIS — I25118 Atherosclerotic heart disease of native coronary artery with other forms of angina pectoris: Secondary | ICD-10-CM | POA: Diagnosis present

## 2024-06-19 DIAGNOSIS — R011 Cardiac murmur, unspecified: Secondary | ICD-10-CM | POA: Diagnosis present

## 2024-06-19 LAB — ECHOCARDIOGRAM COMPLETE
AR max vel: 0.75 cm2
AV Area VTI: 0.75 cm2
AV Area mean vel: 0.7 cm2
AV Mean grad: 34.8 mmHg
AV Peak grad: 58.6 mmHg
AV Vena cont: 0.4 cm
Ao pk vel: 3.83 m/s
Area-P 1/2: 3.23 cm2
Calc EF: 65.2 %
MV M vel: 5.27 m/s
MV Peak grad: 111.1 mmHg
S' Lateral: 2.8 cm
Single Plane A2C EF: 63.4 %
Single Plane A4C EF: 68.8 %

## 2024-06-20 ENCOUNTER — Ambulatory Visit: Payer: Self-pay | Admitting: Cardiology

## 2024-06-20 NOTE — Telephone Encounter (Signed)
-----   Message from Jennifer Crape, MD sent at 06/20/2024  9:12 AM EST ----- Moderate to severe aortic stenosis.  If he has symptoms of chest pain, syncope or dyspnea on exertion plan is to get in touch with us .  Copy primary Jennifer JONELLE Crape, MD 06/20/2024 9:11 AM

## 2024-06-20 NOTE — Telephone Encounter (Signed)
 Left vm to return call.

## 2024-07-05 ENCOUNTER — Ambulatory Visit: Attending: Cardiology | Admitting: Cardiology

## 2024-07-05 ENCOUNTER — Encounter: Payer: Self-pay | Admitting: Cardiology

## 2024-07-05 VITALS — BP 134/70 | HR 68 | Ht 73.0 in | Wt 214.0 lb

## 2024-07-05 DIAGNOSIS — I350 Nonrheumatic aortic (valve) stenosis: Secondary | ICD-10-CM

## 2024-07-05 DIAGNOSIS — I25118 Atherosclerotic heart disease of native coronary artery with other forms of angina pectoris: Secondary | ICD-10-CM | POA: Diagnosis not present

## 2024-07-05 DIAGNOSIS — I2510 Atherosclerotic heart disease of native coronary artery without angina pectoris: Secondary | ICD-10-CM

## 2024-07-05 DIAGNOSIS — E782 Mixed hyperlipidemia: Secondary | ICD-10-CM

## 2024-07-05 DIAGNOSIS — I10 Essential (primary) hypertension: Secondary | ICD-10-CM

## 2024-07-05 DIAGNOSIS — I209 Angina pectoris, unspecified: Secondary | ICD-10-CM | POA: Insufficient documentation

## 2024-07-05 MED ORDER — NITROGLYCERIN 0.4 MG SL SUBL: 0.4000 mg | SUBLINGUAL_TABLET | SUBLINGUAL | 6 refills | Status: AC | PRN

## 2024-07-05 NOTE — Patient Instructions (Signed)
 Medication Instructions:  Your physician has recommended you make the following change in your medication:   Use nitroglycerin  as needed for chest pain.  *If you need a refill on your cardiac medications before your next appointment, please call your pharmacy*   Lab Work: Your physician recommends that you have a BMET and CBC today in the office for your upcoming procedure.  If you have labs (blood work) drawn today and your tests are completely normal, you will receive your results only by: MyChart Message (if you have MyChart) OR A paper copy in the mail If you have any lab test that is abnormal or we need to change your treatment, we will call you to review the results.   Testing/Procedures:  [place] [facility] [place] [address] [place], [place] [address] [place] [address] Dept: [phone] Loc: [phone]  Steven [name]  07/05/2024  You are scheduled for a Cardiac Catheterization on Wednesday, January 14 with Dr. Gordy Bergamo.  1. Please arrive at the [facility] (Main Entrance A) at [facility]: [address] [place], [place] [address] at 8:00 AM (This time is 2 hour(s) before your procedure to ensure your preparation).   Free valet parking service is available. You will check in at ADMITTING. The support person will be asked to wait in the waiting room.  It is OK to have someone drop you off and come back when you are ready to be discharged.    Special note: Every effort is made to have your procedure done on time. Please understand that emergencies sometimes delay scheduled procedures.  2. Diet: Nothing to eat after midnight.   3. Hydration: You need to be well hydrated before your procedure. On January 14, you may drink approved liquids (see below) until 2 hours before the procedure, with 16 oz of water as your last intake.   List of approved liquids water, clear juice, clear  tea, black coffee, fruit juices, non-citric and without pulp, carbonated beverages, Gatorade, Kool -Aid, plain Jello-O and plain ice popsicles.  4. Labs: You had labs done today in the office  5. Medication instructions in preparation for your procedure:   Contrast Allergy: No  On the morning of your procedure, take your Aspirin  81 mg and Plavix /Clopidogrel  and any morning medicines NOT listed above.  You may use sips of water.  6. Plan to go home the same day, you will only stay overnight if medically necessary. 7. Bring a current list of your medications and current insurance cards. 8. You MUST have a responsible person to drive you home. 9. Someone MUST be with you the first 24 hours after you arrive home or your discharge will be delayed. 10. Please wear clothes that are easy to get on and off and wear slip-on shoes.  Thank you for allowing us  to care for you!   -- [place] [facility]    Follow-Up: At [facility], you and your health needs are our priority.  As part of our continuing mission to provide you with exceptional heart care, we have created designated Provider Care Teams.  These Care Teams include your primary Cardiologist (physician) and Advanced Practice Providers (APPs -  Physician Assistants and Nurse Practitioners) who all work together to provide you with the care you need, when you need it.  We recommend signing up for the patient portal called MyChart.  Sign up information  is provided on this After Visit Summary.  MyChart is used to connect with patients for Virtual Visits (Telemedicine).  Patients are able to view lab/test results, encounter notes, upcoming appointments, etc.  Non-urgent messages can be sent to your provider as well.   To learn more about what you can do with MyChart, go to [email or web address].    Your next appointment:   2 month(s)  The format for your next appointment:   In Person  Provider:   Jennifer Crape, MD   Other Instructions  Coronary Angiogram With Stent Coronary angiogram with stent placement is a procedure to widen or open a narrow blood vessel of the heart (coronary artery). Arteries may become blocked by cholesterol buildup (plaques) in the lining of the artery wall. When a coronary artery becomes partially blocked, blood flow to that area decreases. This may lead to chest pain or a heart attack (myocardial infarction). A stent is a small piece of metal that looks like mesh or spring. Stent placement may be done as treatment after a heart attack, or to prevent a heart attack if a blocked artery is found by a coronary angiogram. Let your health care provider know about: Any allergies you have, including allergies to medicines or contrast dye. All medicines you are taking, including vitamins, herbs, eye drops, creams, and over-the-counter medicines. Any problems you or family members have had with anesthetic medicines. Any blood disorders you have. Any surgeries you have had. Any medical conditions you have, including kidney problems or kidney failure. Whether you are pregnant or may be pregnant. Whether you are breastfeeding. What are the risks? Generally, this is a safe procedure. However, serious problems may occur, including: Damage to nearby structures or organs, such as the heart, blood vessels, or kidneys. A return of blockage. Bleeding, infection, or bruising at the insertion site. A collection of blood under the skin (hematoma) at the insertion site. A blood clot in another part of the body. Allergic reaction to medicines or dyes. Bleeding into the abdomen (retroperitoneal bleeding). Stroke (rare). Heart attack (rare). What happens before the procedure? Staying hydrated Follow instructions from your health care provider about hydration, which may include: Up to 2 hours before the procedure - you may continue to drink clear liquids, such as water, clear fruit  juice, black coffee, and plain tea.    Eating and drinking restrictions Follow instructions from your health care provider about eating and drinking, which may include: 8 hours before the procedure - stop eating heavy meals or foods, such as meat, fried foods, or fatty foods. 6 hours before the procedure - stop eating light meals or foods, such as toast or cereal. 2 hours before the procedure - stop drinking clear liquids. Medicines Ask your health care provider about: Changing or stopping your regular medicines. This is especially important if you are taking diabetes medicines or blood thinners. Taking medicines such as aspirin  and ibuprofen . These medicines can thin your blood. Do not take these medicines unless your health care provider tells you to take them. Generally, aspirin  is recommended before a thin tube, called a catheter, is passed through a blood vessel and inserted into the heart (cardiac catheterization). Taking over-the-counter medicines, vitamins, herbs, and supplements. General instructions Do not use any products that contain nicotine or tobacco for at least 4 weeks before the procedure. These products include cigarettes, e-cigarettes, and chewing tobacco. If you need help quitting, ask your health care provider. Plan to have someone take you home from the  hospital or clinic. If you will be going home right after the procedure, plan to have someone with you for 24 hours. You may have tests and imaging procedures. Ask your health care provider: How your insertion site will be marked. Ask which artery will be used for the procedure. What steps will be taken to help prevent infection. These may include: Removing hair at the insertion site. Washing skin with a germ-killing soap. Taking antibiotic medicine. What happens during the procedure? An IV will be inserted into one of your veins. Electrodes may be placed on your chest to monitor your heart rate during the  procedure. You will be given one or more of the following: A medicine to help you relax (sedative). A medicine to numb the area (local anesthetic) for catheter insertion. A small incision will be made for catheter insertion. The catheter will be inserted into an artery using a guide wire. The location may be in your groin, your wrist, or the fold of your arm (near your elbow). An X-ray procedure (fluoroscopy) will be used to help guide the catheter to the opening of the heart arteries. A dye will be injected into the catheter. X-rays will be taken. The dye helps to show where any narrowing or blockages are located in the arteries. Tell your health care provider if you have chest pain or trouble breathing. A tiny wire will be guided to the blocked spot, and a balloon will be inflated to make the artery wider. The stent will be expanded to crush the plaques into the wall of the vessel. The stent will hold the area open and improve the blood flow. Most stents have a drug coating to reduce the risk of the stent narrowing over time. The artery may be made wider using a drill, laser, or other tools that remove plaques. The catheter will be removed when the blood flow improves. The stent will stay where it was placed, and the lining of the artery will grow over it. A bandage (dressing) will be placed on the insertion site. Pressure will be applied to stop bleeding. The IV will be removed. This procedure may vary among health care providers and hospitals.    What happens after the procedure? Your blood pressure, heart rate, breathing rate, and blood oxygen level will be monitored until you leave the hospital or clinic. If the procedure is done through the leg, you will lie flat in bed for a few hours or for as long as told by your health care provider. You will be instructed not to bend or cross your legs. The insertion site and the pulse in your foot or wrist will be checked often. You may have more  blood tests, X-rays, and a test that records the electrical activity of your heart (electrocardiogram, or ECG). Do not drive for 24 hours if you were given a sedative during your procedure. Summary Coronary angiogram with stent placement is a procedure to widen or open a narrowed coronary artery. This is done to treat heart problems. Before the procedure, let your health care provider know about all the medical conditions and surgeries you have or have had. This is a safe procedure. However, some problems may occur, including damage to nearby structures or organs, bleeding, blood clots, or allergies. Follow your health care provider's instructions about eating, drinking, medicines, and other lifestyle changes, such as quitting tobacco use before the procedure. This information is not intended to replace advice given to you by your health care provider.  Make sure you discuss any questions you have with your health care provider. Document Revised: 01/04/2019 Document Reviewed: 01/04/2019 Elsevier Patient Education  2021 [employer].

## 2024-07-05 NOTE — Progress Notes (Signed)
 " Cardiology Office Note:    Date:  07/05/2024   ID:  Steven [name], DOB March 11, 1935, MRN [id number]  PCP:  Marvene Prentice SAUNDERS, FNP  Cardiologist:  Jennifer SAUNDERS Crape, MD   Referring MD: Marvene Prentice SAUNDERS, FNP    ASSESSMENT:    1. Mixed hyperlipidemia   2. Essential hypertension   3. Coronary artery disease involving native coronary artery of native heart without angina pectoris   4. Nonrheumatic aortic valve stenosis   5. Angina pectoris    PLAN:    In order of problems listed above:  Angina pectoris: Patient's symptoms are concerning.  He has known coronary artery disease.  Following recommendations were made to the patient.I discussed coronary angiography and left heart catheterization with the patient at extensive length. Procedure, benefits and potential risks were explained. Patient had multiple questions which were answered to the patient's satisfaction. Patient agreed and consented for the procedure. Further recommendations will be made based on the findings of the coronary angiography. In the interim. The patient has any significant symptoms he knows to go to the nearest emergency room. Sublingual nitroglycerin  prescription was sent, its protocol and 911 protocol explained and the patient vocalized understanding questions were answered to the patient's satisfaction Aortic stenosis: Moderate to severe: Right and left heart catheterization will also help assess this.  He is agreeable. Essential hypertension: Blood pressure is stable and diet was emphasized. Mixed dyslipidemia: On lipid-lowering medications followed by primary care.  Diet emphasized. Will be seen in follow-up appointment after the coronary angiography.   Medication Adjustments/Labs and Tests Ordered: Current medicines are reviewed at length with the patient today.  Concerns regarding medicines are outlined above.  Orders Placed This Encounter  Procedures   EKG 12-Lead   No orders of the defined types were placed in  this encounter.    No chief complaint on file.    History of Present Illness:    Steven [name] is a 89 y.o. male patient is a pleasant 89 year old male.  He has past medical history of at least moderate to severe aortic stenosis and coronary artery disease.  He has history of essential hypertension and mixed dyslipidemia.  He mentions to me that he lost his son recently.  He gives history of stress causing substernal chest tightness.  He tells me that he wants his nitroglycerin  refilled.  He denies any chest pain orthopnea or PND at the current time.  He is accompanied by his wife.  At the time of my evaluation, the patient is alert awake oriented and in no distress.  Past Medical History:  Diagnosis Date   Acute kidney injury superimposed on CKD 06/27/2019   Acute metabolic encephalopathy 01/24/2019   Aortic ejection murmur 11/18/2021   Atherosclerotic heart disease of native coronary artery without angina pectoris 11/18/2021   Atrial flutter (HCC)    Benign prostatic hyperplasia with lower urinary tract symptoms 11/18/2021   CAD (coronary artery disease)    02/24/18 PCI/DES x1 to the pLAD   CAD (coronary artery disease), native coronary artery 04/11/2018   Cath 02/24/18 normal Left main, 80% stenosis proximal LAD, no significant disease RCA, 10 % stenosis CFX, mild aortic stenosis, Sierra 3.5 x 15mm to prox LAD Dr. Dann   Carpal tunnel syndrome    Cataract    left eye   Cervical radiculopathy 11/18/2021   Cervical spondylosis without myelopathy 11/18/2021   Chest pain    Chronic kidney disease, stage 3a (HCC) 11/18/2021   CKD (chronic kidney  disease), stage III (HCC)    COVID-19 11/18/2021   Deficiency anemia 02/04/2015   Demand ischemia (HCC) 01/24/2019   Enlarged prostate    Essential hypertension 11/18/2021   Gout 11/18/2021   History of atrial flutter 11/18/2021   History of gout    History of kidney stones    pt has one now but not giving him any problems   Hyperkalemia  06/27/2019   Hyperlipidemia 04/11/2018   Hypertensive heart disease without CHF 04/11/2018   Hyperthermia associated with heat 01/24/2019   Ileus, unspecified (HCC) 06/27/2019   Irregular heart beat 11/18/2021   Jaw pain 11/18/2021   Leukocytopenia 03/11/2015   Mixed hyperlipidemia 11/18/2021   Nonrheumatic aortic valve stenosis    ECHO 01/28/18  Mean gradient 16    Numbness    both hands pt states pinched. 12-17-14 Gabapentin  has improved.   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   Unspecified abnormal finding in specimens from other organs, systems and tissues 11/18/2021   UTI (urinary tract infection) 01/24/2019   Vitamin D deficiency 11/18/2021    Past Surgical History:  Procedure Laterality Date   CATARACT EXTRACTION W/PHACO Left 03/15/2013   Procedure: CATARACT EXTRACTION PHACO AND INTRAOCULAR LENS PLACEMENT (IOC);  Surgeon: Jestine Bunnell, MD;  Location: [facility] OR;  Service: Ophthalmology;  Laterality: Left;   COLONOSCOPY     COLONOSCOPY WITH PROPOFOL  N/A 12/24/2014   Procedure: COLONOSCOPY WITH PROPOFOL ;  Surgeon: Gladis MARLA Louder, MD;  Location: [facility];  Service: Endoscopy;  Laterality: N/A;   CORONARY STENT INTERVENTION N/A 02/24/2018   Procedure: CORONARY STENT INTERVENTION;  Surgeon: Dann Candyce RAMAN, MD;  [facility] INVASIVE CV LAB;  Service: Cardiovascular;  Laterality: N/A;   ELECTROPHYSIOLOGIC STUDY N/A 02/28/2016   Procedure: A-Flutter Ablation;  Surgeon: Elspeth JAYSON Sage, MD;  Location: [facility] INVASIVE CV LAB;  Service: Cardiovascular;  Laterality: N/A;   right cataract removed      RIGHT/LEFT HEART CATH AND CORONARY ANGIOGRAPHY N/A 02/24/2018   Procedure: RIGHT/LEFT HEART CATH AND CORONARY ANGIOGRAPHY;  Surgeon: Dann Candyce RAMAN, MD;  Location: [facility] INVASIVE CV LAB;  Service: Cardiovascular;   Laterality: N/A;   TONSILLECTOMY     as child    Current Medications: Active Medications[1]   Allergies:   Patient has no known allergies.   Social History   Socioeconomic History   Marital status: Widowed    Spouse name: Not on file   Number of children: Not on file   Years of education: Not on file   Highest education level: Not on file  Occupational History   Not on file  Tobacco Use   Smoking status: Former    Types: Cigars   Smokeless tobacco: Former    Types: Chew   Tobacco comments:    smoked cigars 72yrs ago  Vaping Use   Vaping status: Never Used  Substance and Sexual Activity   Alcohol use: No   Drug use: No   Sexual activity: Never  Other Topics Concern   Not on file  Social History Narrative   Not on file   Social Drivers of Health   Tobacco Use: Medium Risk (07/05/2024)   Patient History    Smoking Tobacco Use: Former    Smokeless Tobacco Use: Former    Passive Exposure: Not on Actuary Strain:  Not on file  Food Insecurity: Not on file  Transportation Needs: Not on file  Physical Activity: Not on file  Stress: Not on file  Social Connections: Not on file  Depression (EYV7-0): Not on file  Alcohol Screen: Not on file  Housing: Not on file  Utilities: Not on file  Health Literacy: Not on file     Family History: The patient's family history includes Diabetes in his son; Healthy in his father; Kidney failure in his sister and sister; Other in his mother. There is no history of Heart attack.  ROS:   Please see the history of present illness.    All other systems reviewed and are negative.  EKGs/Labs/Other Studies Reviewed:    The following studies were reviewed today: .SABRAEKG Interpretation Date/Time:  Wednesday July 05 2024 10:10:20 EST Ventricular Rate:  68 PR Interval:  170 QRS Duration:  88 QT Interval:  384 QTC Calculation: 408 R Axis:   -14  Text Interpretation: Normal sinus rhythm with sinus arrhythmia  Moderate voltage criteria for LVH, may be normal variant ( R in aVL , Cornell product ) When compared with ECG of 24-May-2024 09:09, No significant change was found Confirmed by Edwyna Backers [phone]) on 07/05/2024 10:36:04 AM     Recent Labs: No results found for requested labs within last 365 days.  Recent Lipid Panel    Component Value Date/Time   CHOL 131 09/28/2022 1113   TRIG 83 09/28/2022 1113   HDL 53 09/28/2022 1113   CHOLHDL 2.5 09/28/2022 1113   CHOLHDL 3.2 01/11/2018 0701   VLDL 17 01/11/2018 0701   LDLCALC 62 09/28/2022 1113    Physical Exam:    VS:  BP 134/70   Pulse 68   Ht 6' 1 (1.854 m)   Wt 214 lb (97.1 kg)   SpO2 95%   BMI 28.23 kg/m     Wt Readings from Last 3 Encounters:  07/05/24 214 lb (97.1 kg)  05/24/24 205 lb 1.9 oz (93 kg)  09/17/22 221 lb 0.6 oz (100.3 kg)     GEN: Patient is in no acute distress HEENT: Normal NECK: No JVD; No carotid bruits LYMPHATICS: No lymphadenopathy CARDIAC: Hear sounds regular, 2/6 systolic murmur at the apex. RESPIRATORY:  Clear to auscultation without rales, wheezing or rhonchi  ABDOMEN: Soft, non-tender, non-distended MUSCULOSKELETAL:  No edema; No deformity  SKIN: Warm and dry NEUROLOGIC:  Alert and oriented x 3 PSYCHIATRIC:  Normal affect   Signed, Backers JONELLE Edwyna, MD  07/05/2024 10:48 AM    [facility]     [1]  Current Meds  Medication Sig   ascorbic acid  (VITAMIN C) 500 MG tablet Take 1 tablet (500 mg total) by mouth daily.   atorvastatin  (LIPITOR) 40 MG tablet Take 1 tablet (40 mg total) by mouth daily.   Cholecalciferol (VITAMIN D3 ADULT GUMMIES) 25 MCG (1000 UT) CHEW Chew 25 mcg by mouth daily.   clopidogrel  (PLAVIX ) 75 MG tablet Take 1 tablet (75 mg total) by mouth daily. Patient must keep appointment on 05/24/24 for further refills. 3 rd/final attempt   Cyanocobalamin  (VITAMIN B-12 PO) Take 1 tablet by mouth daily.   metoprolol  succinate (TOPROL -XL) 25 MG 24 hr tablet  Take 1 tablet (25 mg total) by mouth daily.   nitroGLYCERIN  (NITROSTAT ) 0.4 MG SL tablet Place 0.4 mg under the tongue every 5 (five) minutes as needed for chest pain.   Omega-3 Fatty Acids (FISH OIL) 1000 MG CAPS Take 1,000 mg by  mouth daily.   RESTASIS 0.05 % ophthalmic emulsion Place 1 drop into both eyes 2 (two) times daily.   tamsulosin  (FLOMAX ) 0.4 MG CAPS capsule Take 0.4 mg by mouth daily.   "

## 2024-07-05 NOTE — H&P (View-Only) (Signed)
 " Cardiology Office Note:    Date:  07/05/2024   ID:  Steven [name], DOB Jun 18, 1935, MRN [id number]  PCP:  Marvene Prentice SAUNDERS, FNP  Cardiologist:  Jennifer SAUNDERS Crape, MD   Referring MD: Marvene Prentice SAUNDERS, FNP    ASSESSMENT:    1. Mixed hyperlipidemia   2. Essential hypertension   3. Coronary artery disease involving native coronary artery of native heart without angina pectoris   4. Nonrheumatic aortic valve stenosis   5. Angina pectoris    PLAN:    In order of problems listed above:  Angina pectoris: Patient's symptoms are concerning.  He has known coronary artery disease.  Following recommendations were made to the patient.I discussed coronary angiography and left heart catheterization with the patient at extensive length. Procedure, benefits and potential risks were explained. Patient had multiple questions which were answered to the patient's satisfaction. Patient agreed and consented for the procedure. Further recommendations will be made based on the findings of the coronary angiography. In the interim. The patient has any significant symptoms he knows to go to the nearest emergency room. Sublingual nitroglycerin  prescription was sent, its protocol and 911 protocol explained and the patient vocalized understanding questions were answered to the patient's satisfaction Aortic stenosis: Moderate to severe: Right and left heart catheterization will also help assess this.  He is agreeable. Essential hypertension: Blood pressure is stable and diet was emphasized. Mixed dyslipidemia: On lipid-lowering medications followed by primary care.  Diet emphasized. Will be seen in follow-up appointment after the coronary angiography.   Medication Adjustments/Labs and Tests Ordered: Current medicines are reviewed at length with the patient today.  Concerns regarding medicines are outlined above.  Orders Placed This Encounter  Procedures   EKG 12-Lead   No orders of the defined types were placed in  this encounter.    No chief complaint on file.    History of Present Illness:    Steven [name] is a 89 y.o. male patient is a pleasant 89 year old male.  He has past medical history of at least moderate to severe aortic stenosis and coronary artery disease.  He has history of essential hypertension and mixed dyslipidemia.  He mentions to me that he lost his son recently.  He gives history of stress causing substernal chest tightness.  He tells me that he wants his nitroglycerin  refilled.  He denies any chest pain orthopnea or PND at the current time.  He is accompanied by his wife.  At the time of my evaluation, the patient is alert awake oriented and in no distress.  Past Medical History:  Diagnosis Date   Acute kidney injury superimposed on CKD 06/27/2019   Acute metabolic encephalopathy 01/24/2019   Aortic ejection murmur 11/18/2021   Atherosclerotic heart disease of native coronary artery without angina pectoris 11/18/2021   Atrial flutter (HCC)    Benign prostatic hyperplasia with lower urinary tract symptoms 11/18/2021   CAD (coronary artery disease)    02/24/18 PCI/DES x1 to the pLAD   CAD (coronary artery disease), native coronary artery 04/11/2018   Cath 02/24/18 normal Left main, 80% stenosis proximal LAD, no significant disease RCA, 10 % stenosis CFX, mild aortic stenosis, Sierra 3.5 x 15mm to prox LAD Dr. Dann   Carpal tunnel syndrome    Cataract    left eye   Cervical radiculopathy 11/18/2021   Cervical spondylosis without myelopathy 11/18/2021   Chest pain    Chronic kidney disease, stage 3a (HCC) 11/18/2021   CKD (chronic kidney  disease), stage III (HCC)    COVID-19 11/18/2021   Deficiency anemia 02/04/2015   Demand ischemia (HCC) 01/24/2019   Enlarged prostate    Essential hypertension 11/18/2021   Gout 11/18/2021   History of atrial flutter 11/18/2021   History of gout    History of kidney stones    pt has one now but not giving him any problems   Hyperkalemia  06/27/2019   Hyperlipidemia 04/11/2018   Hypertensive heart disease without CHF 04/11/2018   Hyperthermia associated with heat 01/24/2019   Ileus, unspecified (HCC) 06/27/2019   Irregular heart beat 11/18/2021   Jaw pain 11/18/2021   Leukocytopenia 03/11/2015   Mixed hyperlipidemia 11/18/2021   Nonrheumatic aortic valve stenosis    ECHO 01/28/18  Mean gradient 16    Numbness    both hands pt states pinched. 12-17-14 Gabapentin  has improved.   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   Unspecified abnormal finding in specimens from other organs, systems and tissues 11/18/2021   UTI (urinary tract infection) 01/24/2019   Vitamin D deficiency 11/18/2021    Past Surgical History:  Procedure Laterality Date   CATARACT EXTRACTION W/PHACO Left 03/15/2013   Procedure: CATARACT EXTRACTION PHACO AND INTRAOCULAR LENS PLACEMENT (IOC);  Surgeon: Jestine Bunnell, MD;  Location: [facility] OR;  Service: Ophthalmology;  Laterality: Left;   COLONOSCOPY     COLONOSCOPY WITH PROPOFOL  N/A 12/24/2014   Procedure: COLONOSCOPY WITH PROPOFOL ;  Surgeon: Gladis MARLA Louder, MD;  Location: [facility];  Service: Endoscopy;  Laterality: N/A;   CORONARY STENT INTERVENTION N/A 02/24/2018   Procedure: CORONARY STENT INTERVENTION;  Surgeon: Dann Candyce RAMAN, MD;  Location: [facility] INVASIVE CV LAB;  Service: Cardiovascular;  Laterality: N/A;   ELECTROPHYSIOLOGIC STUDY N/A 02/28/2016   Procedure: A-Flutter Ablation;  Surgeon: Elspeth JAYSON Sage, MD;  Location: [facility] INVASIVE CV LAB;  Service: Cardiovascular;  Laterality: N/A;   right cataract removed      RIGHT/LEFT HEART CATH AND CORONARY ANGIOGRAPHY N/A 02/24/2018   Procedure: RIGHT/LEFT HEART CATH AND CORONARY ANGIOGRAPHY;  Surgeon: Dann Candyce RAMAN, MD;  Location: [facility] INVASIVE CV LAB;  Service: Cardiovascular;   Laterality: N/A;   TONSILLECTOMY     as child    Current Medications: Active Medications[1]   Allergies:   Patient has no known allergies.   Social History   Socioeconomic History   Marital status: Widowed    Spouse name: Not on file   Number of children: Not on file   Years of education: Not on file   Highest education level: Not on file  Occupational History   Not on file  Tobacco Use   Smoking status: Former    Types: Cigars   Smokeless tobacco: Former    Types: Chew   Tobacco comments:    smoked cigars 28yrs ago  Vaping Use   Vaping status: Never Used  Substance and Sexual Activity   Alcohol use: No   Drug use: No   Sexual activity: Never  Other Topics Concern   Not on file  Social History Narrative   Not on file   Social Drivers of Health   Tobacco Use: Medium Risk (07/05/2024)   Patient History    Smoking Tobacco Use: Former    Smokeless Tobacco Use: Former    Passive Exposure: Not on Actuary Strain:  Not on file  Food Insecurity: Not on file  Transportation Needs: Not on file  Physical Activity: Not on file  Stress: Not on file  Social Connections: Not on file  Depression (EYV7-0): Not on file  Alcohol Screen: Not on file  Housing: Not on file  Utilities: Not on file  Health Literacy: Not on file     Family History: The patient's family history includes Diabetes in his son; Healthy in his father; Kidney failure in his sister and sister; Other in his mother. There is no history of Heart attack.  ROS:   Please see the history of present illness.    All other systems reviewed and are negative.  EKGs/Labs/Other Studies Reviewed:    The following studies were reviewed today: .SABRAEKG Interpretation Date/Time:  Wednesday July 05 2024 10:10:20 EST Ventricular Rate:  68 PR Interval:  170 QRS Duration:  88 QT Interval:  384 QTC Calculation: 408 R Axis:   -14  Text Interpretation: Normal sinus rhythm with sinus arrhythmia  Moderate voltage criteria for LVH, may be normal variant ( R in aVL , Cornell product ) When compared with ECG of 24-May-2024 09:09, No significant change was found Confirmed by Edwyna Backers [phone]) on 07/05/2024 10:36:04 AM     Recent Labs: No results found for requested labs within last 365 days.  Recent Lipid Panel    Component Value Date/Time   CHOL 131 09/28/2022 1113   TRIG 83 09/28/2022 1113   HDL 53 09/28/2022 1113   CHOLHDL 2.5 09/28/2022 1113   CHOLHDL 3.2 01/11/2018 0701   VLDL 17 01/11/2018 0701   LDLCALC 62 09/28/2022 1113    Physical Exam:    VS:  BP 134/70   Pulse 68   Ht 6' 1 (1.854 m)   Wt 214 lb (97.1 kg)   SpO2 95%   BMI 28.23 kg/m     Wt Readings from Last 3 Encounters:  07/05/24 214 lb (97.1 kg)  05/24/24 205 lb 1.9 oz (93 kg)  09/17/22 221 lb 0.6 oz (100.3 kg)     GEN: Patient is in no acute distress HEENT: Normal NECK: No JVD; No carotid bruits LYMPHATICS: No lymphadenopathy CARDIAC: Hear sounds regular, 2/6 systolic murmur at the apex. RESPIRATORY:  Clear to auscultation without rales, wheezing or rhonchi  ABDOMEN: Soft, non-tender, non-distended MUSCULOSKELETAL:  No edema; No deformity  SKIN: Warm and dry NEUROLOGIC:  Alert and oriented x 3 PSYCHIATRIC:  Normal affect   Signed, Backers JONELLE Edwyna, MD  07/05/2024 10:48 AM     [facility]     [1]  Current Meds  Medication Sig   ascorbic acid  (VITAMIN C) 500 MG tablet Take 1 tablet (500 mg total) by mouth daily.   atorvastatin  (LIPITOR) 40 MG tablet Take 1 tablet (40 mg total) by mouth daily.   Cholecalciferol (VITAMIN D3 ADULT GUMMIES) 25 MCG (1000 UT) CHEW Chew 25 mcg by mouth daily.   clopidogrel  (PLAVIX ) 75 MG tablet Take 1 tablet (75 mg total) by mouth daily. Patient must keep appointment on 05/24/24 for further refills. 3 rd/final attempt   Cyanocobalamin  (VITAMIN B-12 PO) Take 1 tablet by mouth daily.   metoprolol  succinate (TOPROL -XL) 25 MG 24 hr tablet  Take 1 tablet (25 mg total) by mouth daily.   nitroGLYCERIN  (NITROSTAT ) 0.4 MG SL tablet Place 0.4 mg under the tongue every 5 (five) minutes as needed for chest pain.   Omega-3 Fatty Acids (FISH OIL) 1000 MG CAPS Take 1,000 mg by  mouth daily.   RESTASIS  0.05 % ophthalmic emulsion Place 1 drop into both eyes 2 (two) times daily.   tamsulosin  (FLOMAX ) 0.4 MG CAPS capsule Take 0.4 mg by mouth daily.   "

## 2024-07-06 ENCOUNTER — Ambulatory Visit: Payer: Self-pay | Admitting: Cardiology

## 2024-07-06 LAB — CBC WITH DIFFERENTIAL/PLATELET
Basophils Absolute: 0 x10E3/uL (ref 0.0–0.2)
Basos: 1 %
EOS (ABSOLUTE): 0.1 x10E3/uL (ref 0.0–0.4)
Eos: 2 %
Hematocrit: 31.3 % — ABNORMAL LOW (ref 37.5–51.0)
Hemoglobin: 10.3 g/dL — ABNORMAL LOW (ref 13.0–17.7)
Immature Grans (Abs): 0 x10E3/uL (ref 0.0–0.1)
Immature Granulocytes: 0 %
Lymphocytes Absolute: 1.5 x10E3/uL (ref 0.7–3.1)
Lymphs: 36 %
MCH: 29.6 pg (ref 26.6–33.0)
MCHC: 32.9 g/dL (ref 31.5–35.7)
MCV: 90 fL (ref 79–97)
Monocytes Absolute: 0.7 x10E3/uL (ref 0.1–0.9)
Monocytes: 15 %
Neutrophils Absolute: 2 x10E3/uL (ref 1.4–7.0)
Neutrophils: 46 %
Platelets: 162 x10E3/uL (ref 150–450)
RBC: 3.48 x10E6/uL — ABNORMAL LOW (ref 4.14–5.80)
RDW: 14.1 % (ref 11.6–15.4)
WBC: 4.3 x10E3/uL (ref 3.4–10.8)

## 2024-07-06 LAB — BASIC METABOLIC PANEL WITH GFR
BUN/Creatinine Ratio: 13 (ref 10–24)
BUN: 15 mg/dL (ref 8–27)
CO2: 22 mmol/L (ref 20–29)
Calcium: 9.5 mg/dL (ref 8.6–10.2)
Chloride: 107 mmol/L — ABNORMAL HIGH (ref 96–106)
Creatinine, Ser: 1.15 mg/dL (ref 0.76–1.27)
Glucose: 88 mg/dL (ref 70–99)
Potassium: 4.8 mmol/L (ref 3.5–5.2)
Sodium: 144 mmol/L (ref 134–144)
eGFR: 62 mL/min/1.73

## 2024-07-11 ENCOUNTER — Telehealth: Payer: Self-pay | Admitting: *Deleted

## 2024-07-11 NOTE — Telephone Encounter (Addendum)
 Cardiac Catheterization scheduled at [facility] for: Wednesday July 12, 2024 10 AM Arrival time [facility] Main Entrance A at: 8 AM  Diet: -Nothing to eat after midnight.  Hydration: -May drink clear liquids until 2 hours before the procedure.  Approved liquids: Water , clear tea, black coffee, fruit juices-non-citric and without pulp,Gatorade, plain Jello/popsicles.   -Please drink 16 oz of water  2 hours before procedure.  Medication instructions: -Usual morning medications can be taken including aspirin  81 mg and Plavix  75 mg  Plan to go home the same day, you will only stay overnight if medically necessary.  You must have responsible adult to drive you home.  Someone must be with you the first 24 hours after you arrive home.  Reviewed procedure instructions with patient.

## 2024-07-12 ENCOUNTER — Other Ambulatory Visit (HOSPITAL_COMMUNITY): Payer: Self-pay

## 2024-07-12 ENCOUNTER — Ambulatory Visit (HOSPITAL_COMMUNITY): Admission: RE | Disposition: A | Payer: Self-pay | Source: Home / Self Care | Attending: Cardiology

## 2024-07-12 ENCOUNTER — Ambulatory Visit (HOSPITAL_COMMUNITY)
Admission: RE | Admit: 2024-07-12 | Discharge: 2024-07-13 | Disposition: A | Attending: Cardiology | Admitting: Cardiology

## 2024-07-12 ENCOUNTER — Other Ambulatory Visit: Payer: Self-pay

## 2024-07-12 DIAGNOSIS — I209 Angina pectoris, unspecified: Secondary | ICD-10-CM

## 2024-07-12 DIAGNOSIS — Z955 Presence of coronary angioplasty implant and graft: Secondary | ICD-10-CM

## 2024-07-12 DIAGNOSIS — I2510 Atherosclerotic heart disease of native coronary artery without angina pectoris: Secondary | ICD-10-CM | POA: Diagnosis present

## 2024-07-12 DIAGNOSIS — Z7982 Long term (current) use of aspirin: Secondary | ICD-10-CM | POA: Diagnosis not present

## 2024-07-12 DIAGNOSIS — I2722 Pulmonary hypertension due to left heart disease: Secondary | ICD-10-CM | POA: Insufficient documentation

## 2024-07-12 DIAGNOSIS — I2584 Coronary atherosclerosis due to calcified coronary lesion: Secondary | ICD-10-CM | POA: Insufficient documentation

## 2024-07-12 DIAGNOSIS — I10 Essential (primary) hypertension: Secondary | ICD-10-CM

## 2024-07-12 DIAGNOSIS — I25119 Atherosclerotic heart disease of native coronary artery with unspecified angina pectoris: Secondary | ICD-10-CM | POA: Diagnosis not present

## 2024-07-12 DIAGNOSIS — I350 Nonrheumatic aortic (valve) stenosis: Secondary | ICD-10-CM

## 2024-07-12 DIAGNOSIS — E782 Mixed hyperlipidemia: Secondary | ICD-10-CM

## 2024-07-12 DIAGNOSIS — Z7902 Long term (current) use of antithrombotics/antiplatelets: Secondary | ICD-10-CM | POA: Diagnosis not present

## 2024-07-12 HISTORY — PX: RIGHT/LEFT HEART CATH AND CORONARY ANGIOGRAPHY: CATH118266

## 2024-07-12 HISTORY — PX: CORONARY STENT INTERVENTION: CATH118234

## 2024-07-12 HISTORY — PX: CORONARY PRESSURE/FFR WITH 3D MAPPING: CATH118309

## 2024-07-12 LAB — POCT I-STAT EG7
Acid-Base Excess: 2 mmol/L (ref 0.0–2.0)
Acid-Base Excess: 2 mmol/L (ref 0.0–2.0)
Bicarbonate: 27.1 mmol/L (ref 20.0–28.0)
Bicarbonate: 27.6 mmol/L (ref 20.0–28.0)
Calcium, Ion: 1.26 mmol/L (ref 1.15–1.40)
Calcium, Ion: 1.27 mmol/L (ref 1.15–1.40)
HCT: 28 % — ABNORMAL LOW (ref 39.0–52.0)
HCT: 28 % — ABNORMAL LOW (ref 39.0–52.0)
Hemoglobin: 9.5 g/dL — ABNORMAL LOW (ref 13.0–17.0)
Hemoglobin: 9.5 g/dL — ABNORMAL LOW (ref 13.0–17.0)
O2 Saturation: 67 %
O2 Saturation: 68 %
Potassium: 4 mmol/L (ref 3.5–5.1)
Potassium: 4.1 mmol/L (ref 3.5–5.1)
Sodium: 140 mmol/L (ref 135–145)
Sodium: 140 mmol/L (ref 135–145)
TCO2: 29 mmol/L (ref 22–32)
TCO2: 29 mmol/L (ref 22–32)
pCO2, Ven: 46 mmHg (ref 44–60)
pCO2, Ven: 46.2 mmHg (ref 44–60)
pH, Ven: 7.379 (ref 7.25–7.43)
pH, Ven: 7.384 (ref 7.25–7.43)
pO2, Ven: 36 mmHg (ref 32–45)
pO2, Ven: 36 mmHg (ref 32–45)

## 2024-07-12 LAB — POCT I-STAT 7, (LYTES, BLD GAS, ICA,H+H)
Acid-Base Excess: 0 mmol/L (ref 0.0–2.0)
Bicarbonate: 25.2 mmol/L (ref 20.0–28.0)
Calcium, Ion: 1.26 mmol/L (ref 1.15–1.40)
HCT: 28 % — ABNORMAL LOW (ref 39.0–52.0)
Hemoglobin: 9.5 g/dL — ABNORMAL LOW (ref 13.0–17.0)
O2 Saturation: 94 %
Potassium: 4.1 mmol/L (ref 3.5–5.1)
Sodium: 139 mmol/L (ref 135–145)
TCO2: 26 mmol/L (ref 22–32)
pCO2 arterial: 40.8 mmHg (ref 32–48)
pH, Arterial: 7.399 (ref 7.35–7.45)
pO2, Arterial: 72 mmHg — ABNORMAL LOW (ref 83–108)

## 2024-07-12 LAB — POCT ACTIVATED CLOTTING TIME: Activated Clotting Time: 281 s

## 2024-07-12 SURGERY — RIGHT/LEFT HEART CATH AND CORONARY ANGIOGRAPHY
Anesthesia: LOCAL

## 2024-07-12 MED ORDER — SODIUM CHLORIDE 0.9% FLUSH: 3.0000 mL | Freq: Two times a day (BID) | INTRAVENOUS | Status: DC

## 2024-07-12 MED ORDER — SODIUM CHLORIDE 0.9 % IV SOLN
INTRAVENOUS | Status: DC | PRN
  Administered 2024-07-12: 10 mL/h via INTRAVENOUS

## 2024-07-12 MED ORDER — ASPIRIN 81 MG PO CHEW
81.0000 mg | CHEWABLE_TABLET | ORAL | Status: AC
  Administered 2024-07-12: 81 mg via ORAL
  Filled 2024-07-12: qty 1

## 2024-07-12 MED ORDER — SODIUM CHLORIDE 0.9% FLUSH
3.0000 mL | Freq: Two times a day (BID) | INTRAVENOUS | Status: DC
  Administered 2024-07-12 – 2024-07-13 (×2): 3 mL via INTRAVENOUS

## 2024-07-12 MED ORDER — ACETAMINOPHEN 325 MG PO TABS: 650.0000 mg | ORAL_TABLET | ORAL | Status: DC | PRN

## 2024-07-12 MED ORDER — LIDOCAINE HCL (PF) 1 % IJ SOLN
INTRAMUSCULAR | Status: AC
  Filled 2024-07-12: qty 30

## 2024-07-12 MED ORDER — VERAPAMIL HCL 2.5 MG/ML IV SOLN
INTRAVENOUS | Status: AC
  Filled 2024-07-12: qty 2

## 2024-07-12 MED ORDER — ASPIRIN 81 MG PO TBEC
81.0000 mg | DELAYED_RELEASE_TABLET | Freq: Every day | ORAL | Status: DC
  Administered 2024-07-13: 81 mg via ORAL
  Filled 2024-07-12: qty 1

## 2024-07-12 MED ORDER — HYDRALAZINE HCL 20 MG/ML IJ SOLN
INTRAMUSCULAR | Status: DC | PRN
  Administered 2024-07-12: 10 mg via INTRAVENOUS

## 2024-07-12 MED ORDER — FENTANYL CITRATE (PF) 100 MCG/2ML IJ SOLN
INTRAMUSCULAR | Status: AC
  Filled 2024-07-12: qty 2

## 2024-07-12 MED ORDER — VERAPAMIL HCL 2.5 MG/ML IV SOLN
INTRAVENOUS | Status: DC | PRN
  Administered 2024-07-12: 10 mL via INTRA_ARTERIAL

## 2024-07-12 MED ORDER — CLOPIDOGREL BISULFATE 75 MG PO TABS
75.0000 mg | ORAL_TABLET | Freq: Every day | ORAL | Status: DC
  Administered 2024-07-13: 75 mg via ORAL
  Filled 2024-07-12: qty 1

## 2024-07-12 MED ORDER — SODIUM CHLORIDE 0.9% FLUSH: 3.0000 mL | INTRAVENOUS | Status: DC | PRN

## 2024-07-12 MED ORDER — NITROGLYCERIN 1 MG/10 ML FOR IR/CATH LAB
INTRA_ARTERIAL | Status: AC
  Filled 2024-07-12: qty 10

## 2024-07-12 MED ORDER — METOPROLOL SUCCINATE ER 25 MG PO TB24
25.0000 mg | ORAL_TABLET | Freq: Every day | ORAL | Status: DC
  Administered 2024-07-13: 25 mg via ORAL
  Filled 2024-07-12: qty 1

## 2024-07-12 MED ORDER — CYCLOSPORINE 0.05 % OP EMUL: 1.0000 [drp] | Freq: Two times a day (BID) | OPHTHALMIC | Status: DC | PRN

## 2024-07-12 MED ORDER — TAMSULOSIN HCL 0.4 MG PO CAPS
0.4000 mg | ORAL_CAPSULE | Freq: Every day | ORAL | Status: DC
  Administered 2024-07-13: 0.4 mg via ORAL
  Filled 2024-07-12: qty 1

## 2024-07-12 MED ORDER — HEPARIN SODIUM (PORCINE) 1000 UNIT/ML IJ SOLN
INTRAMUSCULAR | Status: DC | PRN
  Administered 2024-07-12: 2000 [IU] via INTRAVENOUS
  Administered 2024-07-12 (×2): 5000 [IU] via INTRAVENOUS

## 2024-07-12 MED ORDER — MIDAZOLAM HCL 2 MG/2ML IJ SOLN
INTRAMUSCULAR | Status: AC
  Filled 2024-07-12: qty 2

## 2024-07-12 MED ORDER — MIDAZOLAM HCL (PF) 2 MG/2ML IJ SOLN
INTRAMUSCULAR | Status: DC | PRN
  Administered 2024-07-12: 1 mg via INTRAVENOUS

## 2024-07-12 MED ORDER — HEPARIN SODIUM (PORCINE) 1000 UNIT/ML IJ SOLN
INTRAMUSCULAR | Status: AC
  Filled 2024-07-12: qty 10

## 2024-07-12 MED ORDER — CLOPIDOGREL BISULFATE 300 MG PO TABS
ORAL_TABLET | ORAL | Status: DC | PRN
  Administered 2024-07-12: 300 mg via ORAL

## 2024-07-12 MED ORDER — IOHEXOL 350 MG/ML SOLN
INTRAVENOUS | Status: DC | PRN
  Administered 2024-07-12: 120 mL

## 2024-07-12 MED ORDER — FREE WATER: 500.0000 mL | Freq: Once | Status: DC

## 2024-07-12 MED ORDER — SODIUM CHLORIDE 0.9 % IV SOLN: 250.0000 mL | INTRAVENOUS | Status: DC | PRN

## 2024-07-12 MED ORDER — ONDANSETRON HCL 4 MG/2ML IJ SOLN: 4.0000 mg | Freq: Four times a day (QID) | INTRAMUSCULAR | Status: DC | PRN

## 2024-07-12 MED ORDER — ASPIRIN 81 MG PO CHEW
81.0000 mg | CHEWABLE_TABLET | Freq: Every day | ORAL | 1 refills | Status: AC
  Filled 2024-07-12: qty 90, 90d supply, fill #0

## 2024-07-12 MED ORDER — HYDRALAZINE HCL 20 MG/ML IJ SOLN
INTRAMUSCULAR | Status: AC
  Filled 2024-07-12: qty 1

## 2024-07-12 MED ORDER — HYDRALAZINE HCL 20 MG/ML IJ SOLN
5.0000 mg | INTRAMUSCULAR | Status: AC | PRN
  Filled 2024-07-12: qty 1

## 2024-07-12 MED ORDER — FENTANYL CITRATE (PF) 100 MCG/2ML IJ SOLN
INTRAMUSCULAR | Status: DC | PRN
  Administered 2024-07-12: 25 ug via INTRAVENOUS

## 2024-07-12 MED ORDER — LABETALOL HCL 5 MG/ML IV SOLN: 10.0000 mg | INTRAVENOUS | Status: AC | PRN

## 2024-07-12 MED ORDER — CLOPIDOGREL BISULFATE 300 MG PO TABS
ORAL_TABLET | ORAL | Status: AC
  Filled 2024-07-12: qty 1

## 2024-07-12 MED ORDER — ATORVASTATIN CALCIUM 40 MG PO TABS
40.0000 mg | ORAL_TABLET | Freq: Every day | ORAL | Status: DC
  Administered 2024-07-13: 40 mg via ORAL
  Filled 2024-07-12: qty 1

## 2024-07-12 MED ORDER — LIDOCAINE HCL (PF) 1 % IJ SOLN
INTRAMUSCULAR | Status: DC | PRN
  Administered 2024-07-12 (×2): 5 mL via INTRADERMAL

## 2024-07-12 SURGICAL SUPPLY — 19 items
BALLOON EMERGE MR 3.0X15 (BALLOONS) IMPLANT
BALLOON ~~LOC~~ EMERGE MR 3.5X12 (BALLOONS) IMPLANT
CARD KEY FFR CATHWORX (MISCELLANEOUS) IMPLANT
CATH BALLN WEDGE 5F 110CM (CATHETERS) IMPLANT
CATH INFINITI 5 FR AR1 MOD (CATHETERS) IMPLANT
CATH INFINITI AMBI 5FR TG (CATHETERS) IMPLANT
CATH VISTA GUIDE 6FR XB3.5 EPK (CATHETERS) IMPLANT
DEVICE RAD COMP TR BAND LRG (VASCULAR PRODUCTS) IMPLANT
GLIDESHEATH SLEND A-KIT 6F 22G (SHEATH) IMPLANT
GUIDEWIRE ANGLED .035X150CM (WIRE) IMPLANT
GUIDEWIRE INQWIRE 1.5J.035X260 (WIRE) IMPLANT
KIT ENCORE 26 ADVANTAGE (KITS) IMPLANT
KIT HEMO VALVE WATCHDOG (MISCELLANEOUS) IMPLANT
KIT SINGLE USE MANIFOLD (KITS) IMPLANT
PACK CARDIAC CATHETERIZATION (CUSTOM PROCEDURE TRAY) ×1 IMPLANT
SET ATX-X65L (MISCELLANEOUS) IMPLANT
SHEATH GLIDE SLENDER 4/5FR (SHEATH) IMPLANT
STENT SYNERGY XD 3.50X16 (Permanent Stent) IMPLANT
WIRE ASAHI PROWATER 180CM (WIRE) IMPLANT

## 2024-07-12 NOTE — Progress Notes (Incomplete)
"   1630- MD Ganji in room, made MD aware pt is oozing unable to take air out of band. MD checked site. Asked RN to continue to monitor at this time. Also asked charge RN Page to look at site with RN and verify band placement was correct.   1706- PA and MD messaged d/t 14 cc (amount of air after cath lab) placed back in band, unable to remove any air still at this time.  17-Unable to remove any air, oozing with 14 cc in place still. 2 cc added to band at 1756. Orders placed for pt to be admitted.  "

## 2024-07-12 NOTE — Progress Notes (Addendum)
 CARDIAC REHAB PHASE I     Post stent education including site care, restrictions, risk factors, exercise guidelines, NTG use, antiplatelet therapy importance, heart healthy diet, and CRP2 reviewed. All questions and concerns addressed. Will refer to [facility] for CRP2. Plan for home later today.    2:50-3:18 Steven JAYSON [place], RN BSN 07/12/2024 3:18 PM

## 2024-07-12 NOTE — Progress Notes (Signed)
 Per nurse, patient has had slow oozing with deflation of TR band. Discussed with Dr. Ladona who recommends to keep overnight.

## 2024-07-12 NOTE — Progress Notes (Signed)
 Report called Red RN at [phone].

## 2024-07-12 NOTE — Interval H&P Note (Signed)
 History and Physical Interval Note:  07/12/2024 1:15 PM  Steven [name]  has presented today for surgery, with the diagnosis of angina - aortic stenosis.  The various methods of treatment have been discussed with the patient and family. After consideration of risks, benefits and other options for treatment, the patient has consented to  Procedures: RIGHT/LEFT HEART CATH AND CORONARY ANGIOGRAPHY (N/A) for severe aortic stenosis and angina pectoris as a surgical intervention.  The patient's history has been reviewed, patient examined, no change in status, stable for surgery.  I have reviewed the patient's chart and labs.  Questions were answered to the patient's satisfaction.     Gordy Bergamo

## 2024-07-12 NOTE — Progress Notes (Signed)
 Patient had 9 second run of ST 130-135, NP made aware, no new orders at this time.

## 2024-07-13 ENCOUNTER — Encounter (HOSPITAL_COMMUNITY): Payer: Self-pay | Admitting: Cardiology

## 2024-07-13 DIAGNOSIS — I25110 Atherosclerotic heart disease of native coronary artery with unstable angina pectoris: Secondary | ICD-10-CM | POA: Diagnosis not present

## 2024-07-13 DIAGNOSIS — I25119 Atherosclerotic heart disease of native coronary artery with unspecified angina pectoris: Secondary | ICD-10-CM | POA: Diagnosis not present

## 2024-07-13 LAB — CBC
HCT: 30.5 % — ABNORMAL LOW (ref 39.0–52.0)
Hemoglobin: 10.3 g/dL — ABNORMAL LOW (ref 13.0–17.0)
MCH: 29.3 pg (ref 26.0–34.0)
MCHC: 33.8 g/dL (ref 30.0–36.0)
MCV: 86.9 fL (ref 80.0–100.0)
Platelets: 143 K/uL — ABNORMAL LOW (ref 150–400)
RBC: 3.51 MIL/uL — ABNORMAL LOW (ref 4.22–5.81)
RDW: 14 % (ref 11.5–15.5)
WBC: 5.8 K/uL (ref 4.0–10.5)
nRBC: 0 % (ref 0.0–0.2)

## 2024-07-13 LAB — BASIC METABOLIC PANEL WITH GFR
Anion gap: 10 (ref 5–15)
BUN: 19 mg/dL (ref 8–23)
CO2: 25 mmol/L (ref 22–32)
Calcium: 9.4 mg/dL (ref 8.9–10.3)
Chloride: 102 mmol/L (ref 98–111)
Creatinine, Ser: 1.09 mg/dL (ref 0.61–1.24)
GFR, Estimated: >60 mL/min
Glucose, Bld: 102 mg/dL — ABNORMAL HIGH (ref 70–99)
Potassium: 4.1 mmol/L (ref 3.5–5.1)
Sodium: 137 mmol/L (ref 135–145)

## 2024-07-13 MED FILL — Nitroglycerin IV Soln 100 MCG/ML in D5W: INTRA_ARTERIAL | Qty: 10 | Status: AC

## 2024-07-13 NOTE — Progress Notes (Signed)
 V/S taken BP:62/44 MAP:50. Dr. Ladona paged awaiting call back. Pt A&Ox4 c/o 0 pain, no S&S of hypotension.

## 2024-07-13 NOTE — Plan of Care (Signed)
 Pt ready for discharge

## 2024-07-13 NOTE — Discharge Summary (Addendum)
 " Discharge Summary   Patient ID: Steven [name] MRN: [id number]; DOB: January 10, 1935  Admit date: 07/12/2024 Discharge date: 07/13/2024  PCP:  Marvene Prentice SAUNDERS, FNP   [facility] Providers Cardiologist:  Jennifer SAUNDERS Crape, MD     Discharge Diagnoses  Principal Problem:   CAD (coronary artery disease)   Diagnostic Studies/Procedures   R/L heart cath 07/12/24  Cardiac Catheterization 07/12/2024: Hemodynamic data: RA 14/13, mean 11 mmHg RV 41/6, EDP 13 mmHg PA 43/19, mean 26 mmHg.  PA saturation 67%. PW 23/26, mean 16 mmHg.  AO saturation 94%. Qp/Qs 1.0.  PAPi 2.2.  PVR 1.28.  CO 7.78, CI 3.52 by Fick.   LV 180/6, EDP 26 mmHg.  Ao 150/63, mean 95 mmHg.  Peak-to-peak pressure gradient 25.4 mean gradient of 23.5 mmHg.  Calculated aortic valve area 1.26 cm.   Angiographic data: RCA: It has superior origin, with very mild proximal disease, bifurcates early into large PDA and PL branches with minimal disease. LM: Large-caliber vessel, mildly calcified but otherwise normal. LAD: Large-caliber vessel, previously placed 3.5 x 16 mm Sierra stent from 2019 is widely patent in the proximal LAD.  Mid segment at origin of D2 has a focal 70% stenosis, FFR 0.78, hemodynamically significant.  Large D1 and moderate-sized D2. LCx: Gives origin to 3 very small marginals and continues as a large OM 4.   Intervention data: Successful FFR guided PCI to mid LAD with implantation of a 3.5 x 16 mm Synergy XD DES, stenosis reduced from 70% (virtual FFR 0.78) to 0% (post PCI FFR 0.93).         Impression and recommendations: Patient presently on Plavix , will add aspirin  81 mg daily.  DAPT for 6 months.  He only has moderate aortic stenosis with a valve area of 1.26 cm and mean gradient of 23.5 mmHg correlating with echo findings.  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  Discussed with Dr. Crape. _____________   History of Present Illness   Steven [name] is a 89 y.o. male with a past medical  history of aortic stenosis, CAD, HTN, HLD. Followed by Dr. Crape    Patient underwent echocardiogram 06/19/24 that showed EF 55-60%, no wall motion abnormalities, normal RV systolic function, low flow, low gradient moderate-severe AS. He was seen by Dr. Crape on 07/05/24. At that time, patient reported having episodes of chest pain with stress. Pain was described as tightness and was located under his sternum. He was set up for R/L heart cath for evaluation of chest pain and aortic stenosis.    [name] Course    Patient presented on 07/12/24 for scheduled R/L heart catheterization for evaluation of angina and aortic stenosis. Was taken to the cath lab by Dr. Ladona and underwent successful FFR guided PCI to the mid LAD with DES. Stenosis reduced from 70% to 0%. Cath noted only moderate aortic stenosis with mean gradient of 23.5 mmHg. Patient was already taking plavix  prior to procedure, and he was started on aspirin  81 mg daily. Recommended DAPT for 6 months.   Patient was initially planned to be a same day discharge. However, when removing the TR band patient had slow oozing from the cath site. He was admitted overnight for observation   Patient seen on 1/15 and he was doing well without chest pain or shortness of breath. R radial cath site soft, nontender. No bruising or bleeding noted. He was educated on R radial site care and activity restrictions. Reviewed importance of DAPT. He was seen  and examined by Dr. Kriste and was cleared for discharge.   Patient has follow up appointment with Dr. Edwyna on 08/31/24.      Did the patient have an acute coronary syndrome (MI, NSTEMI, STEMI, etc) this admission?:  No                               Did the patient have a percutaneous coronary intervention (stent / angioplasty)?:  Yes.     Cath/PCI Registry Performance & Quality Measures: Aspirin  prescribed? - Yes ADP Receptor Inhibitor (Plavix /Clopidogrel , Brilinta/Ticagrelor or Effient/Prasugrel)  prescribed (includes medically managed patients)? - Yes High Intensity Statin (Lipitor 40-80mg  or Crestor 20-40mg ) prescribed? - Yes For EF <40%, was ACEI/ARB prescribed? - Not Applicable (EF >/= 40%) For EF <40%, Aldosterone Antagonist (Spironolactone or Eplerenone) prescribed? - Not Applicable (EF >/= 40%) Cardiac Rehab Phase II ordered? - Yes    _____________  Discharge Vitals Blood pressure (!) 123/58, pulse 84, temperature 99.7 F (37.6 C), temperature source Oral, resp. rate (!) 23, height 6' 1 (1.854 m), weight 97.1 kg, SpO2 96%.  Filed Weights   07/12/24 0819  Weight: 97.1 kg    Labs & Radiologic Studies  CBC Recent Labs    07/12/24 1328 07/13/24 0322  WBC  --  5.8  HGB 9.5* 10.3*  HCT 28.0* 30.5*  MCV  --  86.9  PLT  --  143*   Basic Metabolic Panel Recent Labs    98/85/73 1328 07/13/24 0322  NA 140 137  K 4.1 4.1  CL  --  102  CO2  --  25  GLUCOSE  --  102*  BUN  --  19  CREATININE  --  1.09  CALCIUM   --  9.4   Liver Function Tests No results for input(s): AST, ALT, ALKPHOS, BILITOT, PROT, ALBUMIN in the last 72 hours. No results for input(s): LIPASE, AMYLASE in the last 72 hours. High Sensitivity Troponin:   No results for input(s): TROPONINIHS in the last 720 hours.  No results for input(s): TRNPT in the last 720 hours.  BNP Invalid input(s): POCBNP No results for input(s): PROBNP in the last 72 hours.  No results for input(s): BNP in the last 72 hours.  D-Dimer No results for input(s): DDIMER in the last 72 hours. Hemoglobin A1C No results for input(s): HGBA1C in the last 72 hours. Fasting Lipid Panel No results for input(s): CHOL, HDL, LDLCALC, TRIG, CHOLHDL, LDLDIRECT in the last 72 hours. No results found for: LIPOA  Thyroid  Function Tests No results for input(s): TSH, T4TOTAL, T3FREE, THYROIDAB in the last 72 hours.  Invalid input(s): FREET3 _____________  CARDIAC  CATHETERIZATION Result Date: 07/12/2024 Images from the original result were not included. Cardiac Catheterization 07/12/2024: Hemodynamic data: RA 14/13, mean 11 mmHg RV 41/6, EDP 13 mmHg PA 43/19, mean 26 mmHg.  PA saturation 67%. PW 23/26, mean 16 mmHg.  AO saturation 94%. Qp/Qs 1.0.  PAPi 2.2.  PVR 1.28.  CO 7.78, CI 3.52 by Fick. LV 180/6, EDP 26 mmHg.  Ao 150/63, mean 95 mmHg.  Peak-to-peak pressure gradient 25.4 mean gradient of 23.5 mmHg.  Calculated aortic valve area 1.26 cm. Angiographic data: RCA: It has superior origin, with very mild proximal disease, bifurcates early into large PDA and PL branches with minimal disease. LM: Large-caliber vessel, mildly calcified but otherwise normal. LAD: Large-caliber vessel, previously placed 3.5 x 16 mm Sierra stent from 2019 is widely patent in the  proximal LAD.  Mid segment at origin of D2 has a focal 70% stenosis, FFR 0.78, hemodynamically significant.  Large D1 and moderate-sized D2. LCx: Gives origin to 3 very small marginals and continues as a large OM 4. Intervention data: Successful FFR guided PCI to mid LAD with implantation of a 3.5 x 16 mm Synergy XD DES, stenosis reduced from 70% (virtual FFR 0.78) to 0% (post PCI FFR 0.93).    Impression and recommendations: Patient presently on Plavix , will add aspirin  81 mg daily.  DAPT for 6 months.  He only has moderate aortic stenosis with a valve area of 1.26 cm and mean gradient of 23.5 mmHg correlating with echo findings.  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  Discussed with Dr. Edwyna.   ECHOCARDIOGRAM COMPLETE Result Date: 06/19/2024    ECHOCARDIOGRAM REPORT   Patient Name:   Steven [name] Date of Exam: 06/19/2024 Medical Rec #:  [id number]      Height:       72.0 in Accession #:    [id number]     Weight:       205.1 lb Date of Birth:  [birth year]      BSA:          2.154 m Patient Age:    89 years       BP:           122/58 mmHg Patient Gender: M              HR:           77 bpm. Exam Location:   [place] Procedure: 2D Echo, 3D Echo, Color Doppler, Limited Color Doppler and Strain            Analysis (Both Spectral and Color Flow Doppler were utilized during            procedure). Indications:    Murmur, heart [R01.1 (ICD-10-CM)]; Coronary artery disease of                 native artery of native heart with stable angina pectoris                 [I25.118 (ICD-10-CM)]  History:        Patient has prior history of Echocardiogram examinations, most                 recent 06/02/2021. CAD, Aortic Valve Disease, Arrythmias:Atrial                 Flutter, Signs/Symptoms:Murmur and Chest Pain; Risk                 Factors:Hypertension, Dyslipidemia and Former Smoker.  Sonographer:    Alan Greenhouse RDMS, RVT, RDCS Referring Phys: [id number] RAJAN R [name] IMPRESSIONS  1. Left ventricular ejection fraction, by estimation, is 55 to 60%. Left ventricular ejection fraction by 3D volume is 56 %. The left ventricle has normal function. The left ventricle has no regional wall motion abnormalities. There is mild concentric left ventricular hypertrophy. Left ventricular diastolic parameters were normal. The average left ventricular global longitudinal strain is -20.8 %. The global longitudinal strain is normal.  2. Right ventricular systolic function is normal. The right ventricular size is normal.  3. The mitral valve is normal in structure. Trivial mitral valve regurgitation. No evidence of mitral stenosis.  4. VC 3 mm     VTI ratio 0.24     low flow l;ow gradient AS AVA 0.75 cm2. The aortic valve  is tricuspid. There is mild calcification of the aortic valve. There is mild thickening of the aortic valve. Aortic valve regurgitation is mild. Moderate to severe aortic valve stenosis.  5. The inferior vena cava is normal in size with greater than 50% respiratory variability, suggesting right atrial pressure of 3 mmHg. Comparison(s): Echocardiogram done 06/02/21 showed an EF of 60-65% with moderate AS and an AV Mean Grad of 22.8  mmHg. FINDINGS  Left Ventricle: Left ventricular ejection fraction, by estimation, is 55 to 60%. Left ventricular ejection fraction by 3D volume is 56 %. The left ventricle has normal function. The left ventricle has no regional wall motion abnormalities. The average left ventricular global longitudinal strain is -20.8 %. Strain was performed and the global longitudinal strain is normal. The left ventricular internal cavity size was normal in size. There is mild concentric left ventricular hypertrophy. Left ventricular diastolic parameters were normal. Indeterminate filling pressures. Right Ventricle: Normal RVFW strain. The right ventricular size is normal. No increase in right ventricular wall thickness. Right ventricular systolic function is normal. Left Atrium: Left atrial size was normal in size. Right Atrium: Right atrial size was normal in size. Pericardium: There is no evidence of pericardial effusion. Mitral Valve: The mitral valve is normal in structure. Trivial mitral valve regurgitation. No evidence of mitral valve stenosis. Tricuspid Valve: The tricuspid valve is normal in structure. Tricuspid valve regurgitation is not demonstrated. No evidence of tricuspid stenosis. Aortic Valve: VC 3 mm VTI ratio 0.24 low flow l;ow gradient AS AVA 0.75 cm2. The aortic valve is tricuspid. There is mild calcification of the aortic valve. There is mild thickening of the aortic valve. Aortic valve regurgitation is mild. Moderate to severe aortic stenosis is present. Aortic valve mean gradient measures 34.8 mmHg. Aortic valve peak gradient measures 58.6 mmHg. Aortic valve area, by VTI measures 0.75 cm. Pulmonic Valve: The pulmonic valve was normal in structure. Pulmonic valve regurgitation is trivial. No evidence of pulmonic stenosis. Aorta: The aortic arch was not well visualized and the aortic root and ascending aorta are structurally normal, with no evidence of dilitation. Venous: The pulmonary veins were not well  visualized. The inferior vena cava is normal in size with greater than 50% respiratory variability, suggesting right atrial pressure of 3 mmHg. IAS/Shunts: No atrial level shunt detected by color flow Doppler. Additional Comments: 3D was performed not requiring image post processing on an independent workstation and was normal.  LEFT VENTRICLE PLAX 2D LVIDd:         4.20 cm         Diastology LVIDs:         2.80 cm         LV e' medial:    5.11 cm/s LV PW:         1.20 cm         LV E/e' medial:  22.1 LV IVS:        1.30 cm         LV e' lateral:   6.42 cm/s LVOT diam:     2.00 cm         LV E/e' lateral: 17.6 LV SV:         72 LV SV Index:   33              2D Longitudinal LVOT Area:     3.14 cm        Strain  2D Strain GLS   -20.5 %                                (A4C): LV Volumes (MOD)               2D Strain GLS   -20.6 % LV vol d, MOD    76.3 ml       (A3C): A2C:                           2D Strain GLS   -21.4 % LV vol d, MOD    67.0 ml       (A2C): A4C:                           2D Strain GLS   -20.8 % LV vol s, MOD    27.9 ml       Avg: A2C: LV vol s, MOD    20.9 ml       3D Volume EF A4C:                           LV 3D EF:    Left LV SV MOD A2C:   48.4 ml                    ventricul LV SV MOD A4C:   67.0 ml                    ar LV SV MOD BP:    48.4 ml                    ejection                                             fraction                                             by 3D                                             volume is                                             56 %.                                 3D Volume EF:                                3D EF:        56 %  LV EDV:       155 ml                                LV ESV:       69 ml                                LV SV:        87 ml RIGHT VENTRICLE RV S prime:     9.46 cm/s  PULMONARY VEINS TAPSE (M-mode): 2.6 cm     Diastolic Velocity: 46.50 cm/s                            S/D  Velocity:       0.90                            Systolic Velocity:  43.20 cm/s LEFT ATRIUM             Index        RIGHT ATRIUM           Index Steven diam:        3.60 cm 1.67 cm/m   RA Area:     11.30 cm Steven Vol (A2C):   50.4 ml 23.40 ml/m  RA Volume:   23.50 ml  10.91 ml/m Steven Vol (A4C):   43.1 ml 20.01 ml/m Steven Biplane Vol: 49.4 ml 22.94 ml/m  AORTIC VALVE AV Area (Vmax):    0.75 cm AV Area (Vmean):   0.70 cm AV Area (VTI):     0.75 cm AV Vmax:           382.75 cm/s AV Vmean:          280.250 cm/s AV VTI:            0.960 m AV Peak Grad:      58.6 mmHg AV Mean Grad:      34.8 mmHg LVOT Vmax:         91.70 cm/s LVOT Vmean:        62.200 cm/s LVOT VTI:          0.229 m LVOT/AV VTI ratio: 0.24 AR Vena Contracta: 0.40 cm  AORTA Ao Root diam: 3.00 cm Ao Asc diam:  3.30 cm MITRAL VALVE                TRICUSPID VALVE MV Area (PHT): 3.23 cm     TR Peak grad:   22.1 mmHg MV Decel Time: 235 msec     TR Vmax:        235.00 cm/s MR Peak grad: 111.1 mmHg MR Vmax:      527.00 cm/s   SHUNTS MV E velocity: 113.00 cm/s  Systemic VTI:  0.23 m MV A velocity: 114.00 cm/s  Systemic Diam: 2.00 cm MV E/A ratio:  0.99 Redell Leiter MD Electronically signed by Redell Leiter MD Signature Date/Time: 06/19/2024/2:23:59 PM    Final     Disposition Pt is being discharged home today in good condition.  Follow-up Plans & Appointments  Discharge Instructions     Amb Referral to Cardiac Rehabilitation   Complete by: As directed    Diagnosis: Coronary Stents   After initial evaluation and assessments completed: Virtual Based Care may be provided  alone or in conjunction with Phase 2 Cardiac Rehab based on patient barriers.: Yes   Intensive Cardiac Rehabilitation (ICR) [facility] location only OR Traditional Cardiac Rehabilitation (TCR) *If criteria for ICR are not met will enroll in TCR [name] only): Yes       Discharge Medications Allergies as of 07/13/2024   No Known Allergies      Medication List     TAKE these medications     ascorbic acid  500 MG tablet Commonly known as: VITAMIN C Take 1 tablet (500 mg total) by mouth daily.   Aspirin  Low Dose 81 MG chewable tablet Generic drug: aspirin  Chew 1 tablet (81 mg total) by mouth daily.   atorvastatin  40 MG tablet Commonly known as: LIPITOR Take 1 tablet (40 mg total) by mouth daily.   clopidogrel  75 MG tablet Commonly known as: PLAVIX  Take 1 tablet (75 mg total) by mouth daily. Patient must keep appointment on 05/24/24 for further refills. 3 rd/final attempt   Fish Oil 1000 MG Caps Take 1,000 mg by mouth daily.   metoprolol  succinate 25 MG 24 hr tablet Commonly known as: TOPROL -XL Take 1 tablet (25 mg total) by mouth daily.   nitroGLYCERIN  0.4 MG SL tablet Commonly known as: NITROSTAT  Place 1 tablet (0.4 mg total) under the tongue every 5 (five) minutes as needed for chest pain.   Restasis  0.05 % ophthalmic emulsion Generic drug: cycloSPORINE  Place 1 drop into both eyes 2 (two) times daily as needed (dry eyes).   tamsulosin  0.4 MG Caps capsule Commonly known as: FLOMAX  Take 0.4 mg by mouth daily.   VITAMIN B-12 PO Take 1 tablet by mouth daily.   Vitamin D3 Adult Gummies 25 MCG (1000 UT) Chew Generic drug: Cholecalciferol Chew 1,000 Units by mouth daily.         Outstanding Labs/Studies   Duration of Discharge Encounter: APP Time: 20 minutes   Signed, Rollo FABIENE Louder, PA-C 07/13/2024, 9:16 AM    Patient seen and examined, note reviewed with the signed Advanced Practice Provider. I personally reviewed laboratory data, imaging studies and relevant notes. I independently examined the patient and formulated the important aspects of the plan. I have personally discussed the plan with the patient and/or family. Comments or changes to the note/plan are indicated below.  Patient profile: 89 year old male who presented for scheduled left and right heart catheterization for angina and aortic stenosis on 07/12/2024 and underwent  successful FFR guided PCI to mid LAD with DES. Had postprocedural right radial oozing prompting overnight observation.   Today he is doing well without further oozing. No complaints.   My Exam:  Physical Exam Vitals and nursing note reviewed.  Constitutional:      Appearance: Normal appearance.  HENT:     Head: Normocephalic and atraumatic.  Eyes:     Conjunctiva/sclera: Conjunctivae normal.  Cardiovascular:     Rate and Rhythm: Normal rate.     Pulses: Normal pulses.     Comments: Right radial pulses intact Pulmonary:     Effort: Pulmonary effort is normal.  Musculoskeletal:        General: No swelling or tenderness.     Comments: No oozing noted on right radial bandage  Skin:    Coloration: Skin is not jaundiced or pale.  Neurological:     Mental Status: He is alert.       Assessment & Plan:  CAD s/p PCI to pLAD 02/24/18 now s/p to mid LAD 07/12/24 for angina - no further oozing from  the wrist. Continue DAPT and statin Moderate aortic stenosis - by cath Hypertension Hyperlipidemia   Total time coordinating care and discharge planning: 32 minutes   Signed, Emeline Calender, DO [place]  [name]  07/13/2024 11:32 AM    "

## 2024-07-17 ENCOUNTER — Telehealth (HOSPITAL_COMMUNITY): Payer: Self-pay

## 2024-07-17 NOTE — Telephone Encounter (Signed)
 alled patient to see if he was interested in Cardiac rehab program.  Patient stated that he was not.  Did not feel that he benefited from the program the last time he did it.  Advised that referral was good for a year.  Closed referral.

## 2024-08-31 ENCOUNTER — Ambulatory Visit: Admitting: Cardiology
